# Patient Record
Sex: Female | Born: 1985 | Race: Black or African American | Hispanic: No | Smoking: Never smoker
Health system: Southern US, Community
[De-identification: ages and names within clinical notes are randomized; demographics above are authoritative.]

## PROBLEM LIST (undated history)

## (undated) DIAGNOSIS — R7303 Prediabetes: Secondary | ICD-10-CM

## (undated) DIAGNOSIS — E669 Obesity, unspecified: Secondary | ICD-10-CM

## (undated) HISTORY — DX: Prediabetes: R73.03

## (undated) HISTORY — PX: FRACTURE SURGERY: SHX138

## (undated) HISTORY — PX: WRIST SURGERY: SHX841

---
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
## 2007-01-18 ENCOUNTER — Emergency Department (HOSPITAL_COMMUNITY): Admission: EM | Admit: 2007-01-18 | Discharge: 2007-01-18 | Payer: Self-pay | Admitting: Family Medicine

## 2008-04-07 ENCOUNTER — Emergency Department (HOSPITAL_COMMUNITY): Admission: EM | Admit: 2008-04-07 | Discharge: 2008-04-07 | Payer: Self-pay | Admitting: Emergency Medicine

## 2009-08-09 ENCOUNTER — Emergency Department (HOSPITAL_COMMUNITY)
Admission: EM | Admit: 2009-08-09 | Discharge: 2009-08-09 | Payer: Self-pay | Source: Home / Self Care | Admitting: Family Medicine

## 2010-05-08 LAB — CULTURE, ROUTINE-ABSCESS

## 2010-06-07 LAB — POCT URINALYSIS DIP (DEVICE)
Bilirubin Urine: NEGATIVE
Glucose, UA: NEGATIVE mg/dL
Ketones, ur: NEGATIVE mg/dL
Protein, ur: NEGATIVE mg/dL
pH: 7 (ref 5.0–8.0)

## 2010-06-07 LAB — HEPATITIS B SURFACE ANTIGEN: Hepatitis B Surface Ag: NEGATIVE

## 2010-06-07 LAB — HEPATITIS C ANTIBODY: HCV Ab: NEGATIVE

## 2010-06-07 LAB — HEPATITIS B CORE ANTIBODY, TOTAL: Hep B Core Total Ab: NEGATIVE

## 2010-06-07 LAB — RPR: RPR Ser Ql: NONREACTIVE

## 2010-06-07 LAB — WET PREP, GENITAL: Yeast Wet Prep HPF POC: NONE SEEN

## 2010-06-07 LAB — HEPATITIS B SURFACE ANTIBODY,QUALITATIVE: Hep B S Ab: POSITIVE — AB

## 2010-11-29 LAB — POCT RAPID STREP A: Streptococcus, Group A Screen (Direct): POSITIVE — AB

## 2013-03-05 ENCOUNTER — Emergency Department (HOSPITAL_COMMUNITY)
Admission: EM | Admit: 2013-03-05 | Discharge: 2013-03-05 | Disposition: A | Discharge status: Home / Self Care | Payer: BC Managed Care – PPO | Attending: Emergency Medicine | Admitting: Emergency Medicine

## 2013-03-05 ENCOUNTER — Encounter (HOSPITAL_COMMUNITY): Payer: Self-pay | Admitting: Emergency Medicine

## 2013-03-05 ENCOUNTER — Emergency Department (HOSPITAL_COMMUNITY): Payer: BC Managed Care – PPO

## 2013-03-05 DIAGNOSIS — S60211A Contusion of right wrist, initial encounter: Secondary | ICD-10-CM

## 2013-03-05 DIAGNOSIS — S300XXA Contusion of lower back and pelvis, initial encounter: Secondary | ICD-10-CM | POA: Insufficient documentation

## 2013-03-05 DIAGNOSIS — Y939 Activity, unspecified: Secondary | ICD-10-CM | POA: Insufficient documentation

## 2013-03-05 DIAGNOSIS — S60221A Contusion of right hand, initial encounter: Secondary | ICD-10-CM

## 2013-03-05 DIAGNOSIS — Y9269 Other specified industrial and construction area as the place of occurrence of the external cause: Secondary | ICD-10-CM | POA: Insufficient documentation

## 2013-03-05 DIAGNOSIS — W009XXA Unspecified fall due to ice and snow, initial encounter: Secondary | ICD-10-CM

## 2013-03-05 DIAGNOSIS — S60229A Contusion of unspecified hand, initial encounter: Secondary | ICD-10-CM | POA: Insufficient documentation

## 2013-03-05 DIAGNOSIS — W1789XA Other fall from one level to another, initial encounter: Secondary | ICD-10-CM | POA: Insufficient documentation

## 2013-03-05 DIAGNOSIS — S60219A Contusion of unspecified wrist, initial encounter: Secondary | ICD-10-CM | POA: Insufficient documentation

## 2013-03-05 MED ORDER — IBUPROFEN 400 MG PO TABS
600.0000 mg | ORAL_TABLET | Freq: Once | ORAL | Status: AC
  Administered 2013-03-05: 600 mg via ORAL
  Filled 2013-03-05 (×2): qty 1

## 2013-03-05 MED ORDER — IBUPROFEN 600 MG PO TABS: 600.0000 mg | ORAL_TABLET | Freq: Four times a day (QID) | ORAL | Status: DC | PRN

## 2013-03-05 NOTE — ED Provider Notes (Signed)
CSN: [id number]     Arrival date & time 03/05/13  1339 History   First MD Initiated Contact with Patient 03/05/13 1530     Chief Complaint  Patient presents with  . 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 Pain is worse with movement and improves with holding still. No medications have been taken at home. Wrist pain is located in the right wrist and radiates towards the right hand and thumb. Pain is dull without modifying factors. Pain has been constant. Lower back pain is located in lower back. Is dull does not radiate is no modifying factors. No history of head injury. No history of bleeding diatheses  Patient is a 28 y.o. female presenting with fall. The history is provided by the patient and a relative.  Fall This is a new problem. The current episode started 3 to 5 hours ago. The problem occurs constantly. The problem has not changed since onset.Pertinent negatives include no chest pain, no abdominal pain, no headaches and no shortness of breath. The symptoms are aggravated by bending. Nothing relieves the symptoms. She has tried nothing for the symptoms. The treatment provided no relief.    History reviewed. No pertinent past medical history. History reviewed. No pertinent past surgical history. No family history on file. History  Substance Use Topics  . Smoking status: Never Smoker   . Smokeless tobacco: Not on file  . Alcohol Use: No   OB History   Grav Para Term Preterm Abortions TAB SAB Ect Mult Living                 Review of Systems  Respiratory: Negative for shortness of breath.   Cardiovascular: Negative for chest pain.  Gastrointestinal: Negative for abdominal pain.  Neurological: Negative for headaches.  All other systems reviewed and are  negative.    Allergies  Review of patient's allergies indicates no known allergies.  Home Medications   Current Outpatient Rx  Name  Route  Sig  Dispense  Refill  . acetaminophen (TYLENOL) 500 MG tablet   Oral   Take 1,000 mg by mouth every 6 (six) hours as needed.          BP 144/81  Pulse 77  Temp(Src) 98 F (36.7 C) (Oral)  Resp 16  SpO2 98% Physical Exam  Nursing note and vitals reviewed. Constitutional: She is oriented to person, place, and time. She appears well-developed and well-nourished.  HENT:  Head: Normocephalic.  Right Ear: External ear normal.  Left Ear: External ear normal.  Nose: Nose normal.  Mouth/Throat: Oropharynx is clear and moist.  Eyes: EOM are normal. Pupils are equal, round, and reactive to light. Right eye exhibits no discharge. Left eye exhibits no discharge.  Neck: Normal range of motion. Neck supple. No tracheal deviation present.  No nuchal rigidity no meningeal signs  Cardiovascular: Normal rate and regular rhythm.   Pulmonary/Chest: Effort normal and breath sounds normal. No stridor. No respiratory distress. She has no wheezes. She has no rales.  Abdominal: Soft. She exhibits no distension and no mass. There is no tenderness. There is no rebound and no guarding.  Musculoskeletal: Normal range of motion. She exhibits tenderness. She exhibits no edema.  Tenderness along distal radius and ulna extending towards the cut or evidence. No snuff box tenderness. Full range of motion of fingers  wrist elbow shoulder. No clavicle shoulder humerus elbow proximal radius and ulna tenderness noted on exam. No lower extremity tenderness noted. No cervical thoracic or lumbar tenderness noted midline. Left and right paraspinal sacral tenderness noted on exam.  Neurological: She is alert and oriented to person, place, and time. She has normal reflexes. She displays normal reflexes. No cranial nerve deficit. She exhibits normal muscle tone. Coordination normal.   Skin: Skin is warm. No rash noted. She is not diaphoretic. No erythema. No pallor.  No pettechia no purpura    ED Course  Procedures (including critical care time) Labs Review Labs Reviewed - No data to display Imaging Review Dg Lumbar Spine 2-3 Views  03/05/2013   CLINICAL DATA:  Larey Seat.  Injured back.  EXAM: LUMBAR SPINE - 2-3 VIEW  COMPARISON:  None.  FINDINGS: Normal alignment of the lumbar vertebral bodies. Disc spaces and vertebral bodies are maintained. The facets are normally aligned. No pars defects. The visualized bony pelvis is intact.  IMPRESSION: Normal alignment and no acute bony findings.   Electronically Signed   By: Loralie Champagne M.D.   On: 03/05/2013 16:32   Dg Wrist Complete Right  03/05/2013   CLINICAL DATA:  Pain.  EXAM: RIGHT WRIST - COMPLETE 3+ VIEW  COMPARISON:  None.  FINDINGS: There is no evidence of fracture or dislocation. There is no evidence of arthropathy or other focal bone abnormality. Soft tissues are unremarkable.  IMPRESSION: Negative.   Electronically Signed   By: Maisie Fus  Register   On: 03/05/2013 14:58   Dg Hand Complete Right  03/05/2013   CLINICAL DATA:  Larey Seat.  Injured right hand.  EXAM: RIGHT HAND - COMPLETE 3+ VIEW  COMPARISON:  Wrist radiographs, same date.  FINDINGS: The joint spaces are maintained.  No acute fracture is identified.  IMPRESSION: No acute fracture.   Electronically Signed   By: Loralie Champagne M.D.   On: 03/05/2013 16:34    EKG Interpretation   None       MDM   1. Fall due to ice or snow   2. Contusion of right wrist   3. Contusion of right hand   4. Sacral contusion      Will obtain screening x-rays of the right wrist right hand and lumbar sacral region to ensure no fracture dislocation or subluxation. We'll give ibuprofen for pain. No other head neck chest abdomen pelvis or extremity complaints at this time. Patient updated and agrees with plan. Patient is an intact neurologic exam.  Pt has had no bowel or bladder  dysfuction    448p x-rays reviewed by myself and show no evidence of acute pathology. Patient remains neurovascularly intact distally and has an intact neurologic exam including reflexes sensation and motor strength of lower extremities. We'll discharge home with ibuprofen as needed for pain. Family and patient updated and agrees with plan.  Arley Phenix, MD 03/05/13 [phone]

## 2013-03-05 NOTE — ED Notes (Signed)
Pt reports slipped on ice this am, pt c/o lower back pain and right wrist/hand pain.

## 2013-03-05 NOTE — Discharge Instructions (Signed)
Hand Contusion A hand contusion is a deep bruise on your hand area. Contusions are the result of an injury that caused bleeding under the skin. The contusion may turn blue, purple, or yellow. Minor injuries will give you a painless contusion, but more severe contusions may stay painful and swollen for a few weeks. CAUSES  A contusion is usually caused by a blow, trauma, or direct force to an area of the body. SYMPTOMS   Swelling and redness of the injured area.  Discoloration of the injured area.  Tenderness and soreness of the injured area.  Pain. DIAGNOSIS  The diagnosis can be made by taking a history and performing a physical exam. An X-ray, CT scan, or MRI may be needed to determine if there were any associated injuries, such as broken bones (fractures). TREATMENT  Often, the best treatment for a hand contusion is resting, elevating, icing, and applying cold compresses to the injured area. Over-the-counter medicines may also be recommended for pain control. HOME CARE INSTRUCTIONS   Put ice on the injured area.  Put ice in a plastic bag.  Place a towel between your skin and the bag.  Leave the ice on for 15-20 minutes, 03-04 times a day.  Only take over-the-counter or prescription medicines as directed by your caregiver. Your caregiver may recommend avoiding anti-inflammatory medicines (aspirin, ibuprofen, and naproxen) for 48 hours because these medicines may increase bruising.  If told, use an elastic wrap as directed. This can help reduce swelling. You may remove the wrap for sleeping, showering, and bathing. If your fingers become numb, cold, or blue, take the wrap off and reapply it more loosely.  Elevate your hand with pillows to reduce swelling.  Avoid overusing your hand if it is painful. SEEK IMMEDIATE MEDICAL CARE IF:   You have increased redness, swelling, or pain in your hand.  Your swelling or pain is not relieved with medicines.  You have loss of feeling in  your hand or are unable to move your fingers.  Your hand turns cold or blue.  You have pain when you move your fingers.  Your hand becomes warm to the touch.  Your contusion does not improve in 2 days. MAKE SURE YOU:   Understand these instructions.  Will watch your condition.  Will get help right away if you are not doing well or get worse. Document Released: 07/29/2001 Document Revised: 11/01/2011 Document Reviewed: 07/31/2011 [facility] Patient Information 2014 [place], [place].  Tailbone Injury The tailbone (coccyx) is the small bone at the lower end of the spine. A tailbone injury may involve stretched ligaments, bruising, or a broken bone (fracture). Women are more vulnerable to this injury due to having a wider pelvis. CAUSES  This type of injury typically occurs from falling and landing on the tailbone. Repeated strain or friction from actions such as rowing and bicycling may also injure the area. The tailbone can be injured during childbirth. Infections or tumors may also press on the tailbone and cause pain. Sometimes, the cause of injury is unknown. SYMPTOMS   Bruising.  Pain when sitting.  Painful bowel movements.  In women, pain during intercourse. DIAGNOSIS  Your caregiver can diagnose a tailbone injury based on your symptoms and a physical exam. X-rays may be taken if a fracture is suspected. Your caregiver may also use an MRI scan imaging test to evaluate your symptoms. TREATMENT  Your caregiver may prescribe medicines to help relieve your pain. Most tailbone injuries heal on their own in 4  to 6 weeks. However, if the injury is caused by an infection or tumor, the recovery period may vary. PREVENTION  Wear appropriate padding and sports gear when bicycling and rowing. This can help prevent an injury from repeated strain or friction. HOME CARE INSTRUCTIONS   Put ice on the injured area.  Put ice in a plastic bag.  Place a towel between your skin and the  bag.  Leave the ice on for 15-20 minutes, every hour while awake for the first 1 to 2 days.  Sit on a large, rubber or inflated ring or cushion to ease your pain. Lean forward when sitting to help decrease discomfort.  Avoid sitting for long periods of time.  Increase your activity as the pain allows.  Only take over-the-counter or prescription medicines for pain, discomfort, or fever as directed by your caregiver.  You may use stool softeners if it is painful to have a bowel movement, or as directed by your caregiver.  Eat a diet with plenty of fiber to help prevent constipation.  Keep all follow-up appointments as directed by your caregiver. SEEK MEDICAL CARE IF:   Your pain becomes worse.  Your bowel movements cause a great deal of discomfort.  You are unable to have a bowel movement.  You have a fever. MAKE SURE YOU:  Understand these instructions.  Will watch your condition.  Will get help right away if you are not doing well or get worse. Document Released: 02/04/2000 Document Revised: 05/01/2011 Document Reviewed: 09/01/2010 [facility] Patient Information 2014 [place], [place].

## 2014-05-07 ENCOUNTER — Emergency Department (HOSPITAL_COMMUNITY)
Admission: EM | Admit: 2014-05-07 | Discharge: 2014-05-07 | Disposition: A | Discharge status: Home / Self Care | Payer: BLUE CROSS/BLUE SHIELD | Attending: Emergency Medicine | Admitting: Emergency Medicine

## 2014-05-07 ENCOUNTER — Encounter (HOSPITAL_COMMUNITY): Payer: Self-pay

## 2014-05-07 DIAGNOSIS — N764 Abscess of vulva: Secondary | ICD-10-CM | POA: Insufficient documentation

## 2014-05-07 MED ORDER — HYDROCODONE-ACETAMINOPHEN 5-325 MG PO TABS
2.0000 | ORAL_TABLET | Freq: Once | ORAL | Status: AC
  Administered 2014-05-07: 2 via ORAL
  Filled 2014-05-07: qty 2

## 2014-05-07 MED ORDER — LIDOCAINE-EPINEPHRINE (PF) 2 %-1:200000 IJ SOLN
10.0000 mL | Freq: Once | INTRAMUSCULAR | Status: DC
  Filled 2014-05-07: qty 20

## 2014-05-07 MED ORDER — HYDROCODONE-ACETAMINOPHEN 5-325 MG PO TABS: 1.0000 | ORAL_TABLET | Freq: Four times a day (QID) | ORAL | Status: DC | PRN

## 2014-05-07 MED ORDER — LIDOCAINE-PRILOCAINE 2.5-2.5 % EX CREA
TOPICAL_CREAM | Freq: Once | CUTANEOUS | Status: AC
  Administered 2014-05-07: 07:00:00 via TOPICAL
  Filled 2014-05-07: qty 5

## 2014-05-07 NOTE — ED Notes (Signed)
Pt reports boil in left groin area. Pt states pain is 10/10. Pt has had boils in past that usually go away on their own.

## 2014-05-07 NOTE — ED Provider Notes (Signed)
CSN: [id number]     Arrival date & time 05/07/14  0547 History   First MD Initiated Contact with Patient 05/07/14 [phone]     Chief Complaint  Patient presents with  . Abscess     (Consider location/radiation/quality/duration/timing/severity/associated sxs/prior Treatment) HPI Comments: Patient is a 29 yo F presenting to the ED for evaluation of an abscess to her left groin area that began two days ago. She states her pain has been worsening since then. Currently her pain is a 10/10. Denies any radiation of her pain. No modifying factors identified. Does endorse a history of abscesses in the past. Denies any fevers, chills, drainage.    History reviewed. No pertinent past medical history. History reviewed. No pertinent past surgical history. History reviewed. No pertinent family history. History  Substance Use Topics  . Smoking status: Never Smoker   . Smokeless tobacco: Not on file  . Alcohol Use: No   OB History    No data available     Review of Systems  Skin: Positive for wound.  All other systems reviewed and are negative.     Allergies  Review of patient's allergies indicates no known allergies.  Home Medications   Prior to Admission medications   Medication Sig Start Date End Date Taking? Authorizing Provider  HYDROcodone-acetaminophen (NORCO/VICODIN) 5-325 MG per tablet Take 1 tablet by mouth every 6 (six) hours as needed for moderate pain.   Yes Historical Provider, MD  ibuprofen (ADVIL,MOTRIN) 800 MG tablet Take 800 mg by mouth every 8 (eight) hours as needed for mild pain.   Yes Historical Provider, MD  ibuprofen (ADVIL,MOTRIN) 600 MG tablet Take 1 tablet (600 mg total) by mouth every 6 (six) hours as needed for fever or mild pain. Patient not taking: Reported on 05/07/2014 03/05/13   Marcellina Millinimothy Galey, MD   BP 149/80 mmHg  Pulse 82  Temp(Src) 99.8 F (37.7 C) (Oral)  Resp 16  Ht 5\' 7"  (1.702 m)  Wt 350 lb (158.759 kg)  BMI 54.80 kg/m2  SpO2 98% Physical Exam    Constitutional: She is oriented to person, place, and time. She appears well-developed and well-nourished. No distress.  HENT:  Head: Normocephalic and atraumatic.  Right Ear: External ear normal.  Left Ear: External ear normal.  Nose: Nose normal.  Mouth/Throat: Oropharynx is clear and moist.  Eyes: Conjunctivae are normal.  Neck: Normal range of motion. Neck supple.  Cardiovascular: Normal rate, regular rhythm and normal heart sounds.   Pulmonary/Chest: Effort normal and breath sounds normal. No respiratory distress.  Abdominal: Soft.  Genitourinary:     Musculoskeletal: Normal range of motion.  Neurological: She is alert and oriented to person, place, and time.  Skin: Skin is warm and dry. She is not diaphoretic.  Psychiatric: She has a normal mood and affect.  Nursing note and vitals reviewed.    ED Course  Procedures (including critical care time) Medications  lidocaine-EPINEPHrine (XYLOCAINE W/EPI) 2 %-1:200000 (PF) injection 10 mL (not administered)  lidocaine-prilocaine (EMLA) cream ( Topical Given 05/07/14 0631)  HYDROcodone-acetaminophen (NORCO/VICODIN) 5-325 MG per tablet 2 tablet (2 tablets Oral Given 05/07/14 0619)    Labs Review Labs Reviewed - No data to display  Imaging Review No results found.   EKG Interpretation None      INCISION AND DRAINAGE Performed by: Francee PiccoloPIEPENBRINK, Saina Waage L Consent: Verbal consent obtained. Risks and benefits: risks, benefits and alternatives were discussed Type: abscess  Body area: left labia  Anesthesia: local infiltration  Incision was made with  a scalpel.  Local anesthetic:LET  Anesthetic total: 3 ml  Complexity: complex Blunt dissection to break up loculations  Drainage: purulent  Drainage amount: copious  Packing material: 1/4 in iodoform gauze  Patient tolerance: Patient tolerated the procedure well with no immediate complications. MDM   Final diagnoses:  None    Filed Vitals:   05/07/14 0630   BP: 149/80  Pulse: 82  Temp:   Resp: 16   Afebrile, NAD, non-toxic appearing, AAOx4.  Patient with skin abscess amenable to incision and drainage.  Abscess was not large enough to warrant packing or drain,  wound recheck in 2 days. Encouraged home warm soaks and flushing.  Mild signs of cellulitis is surrounding skin.  Will d/c to home.  No antibiotic therapy is indicated. Return precautions discussed. Patient is agreeable to plan. Patient is stable at time of discharge      Francee Piccolo, PA-C 05/07/14 1459  Azalia Bilis, MD 05/08/14 [phone]

## 2014-05-07 NOTE — Discharge Instructions (Signed)
Please follow up with your primary care physician in 1-2 days. If you do not have one please call the [facility] number listed above. Please read all discharge instructions and return precautions.  ° ° °Abscess °Care After °An abscess (also called a boil or furuncle) is an infected area that contains a collection of pus. Signs and symptoms of an abscess include pain, tenderness, redness, or hardness, or you may feel a moveable soft area under your skin. An abscess can occur anywhere in the body. The infection may spread to surrounding tissues causing cellulitis. A cut (incision) by the surgeon was made over your abscess and the pus was drained out. Gauze may have been packed into the space to provide a drain that will allow the cavity to heal from the inside outwards. The boil may be painful for 5 to 7 days. Most people with a boil do not have high fevers. Your abscess, if seen early, may not have localized, and may not have been lanced. If not, another appointment may be required for this if it does not get better on its own or with medications. °HOME CARE INSTRUCTIONS  °· Only take over-the-counter or prescription medicines for pain, discomfort, or fever as directed by your caregiver. °· When you bathe, soak and then remove gauze or iodoform packs at least daily or as directed by your caregiver. You may then wash the wound gently with mild soapy water. Repack with gauze or do as your caregiver directs. °SEEK IMMEDIATE MEDICAL CARE IF:  °· You develop increased pain, swelling, redness, drainage, or bleeding in the wound site. °· You develop signs of generalized infection including muscle aches, chills, fever, or a general ill feeling. °· An oral temperature above 102° F (38.9° C) develops, not controlled by medication. °See your caregiver for a recheck if you develop any of the symptoms described above. If medications (antibiotics) were prescribed, take them as directed. °Document Released:  08/25/2004 Document Revised: 05/01/2011 Document Reviewed: 04/22/2007 °ExitCare® Patient Information ©2015 [employer]. This information is not intended to replace advice given to you by your health care provider. Make sure you discuss any questions you have with your health care provider. ° °

## 2014-05-29 ENCOUNTER — Encounter (HOSPITAL_BASED_OUTPATIENT_CLINIC_OR_DEPARTMENT_OTHER): Payer: Self-pay | Admitting: *Deleted

## 2014-05-29 ENCOUNTER — Emergency Department (HOSPITAL_BASED_OUTPATIENT_CLINIC_OR_DEPARTMENT_OTHER)
Admission: EM | Admit: 2014-05-29 | Discharge: 2014-05-29 | Disposition: A | Discharge status: Home / Self Care | Payer: BLUE CROSS/BLUE SHIELD | Attending: Emergency Medicine | Admitting: Emergency Medicine

## 2014-05-29 DIAGNOSIS — S0083XA Contusion of other part of head, initial encounter: Secondary | ICD-10-CM | POA: Insufficient documentation

## 2014-05-29 DIAGNOSIS — W2203XA Walked into furniture, initial encounter: Secondary | ICD-10-CM | POA: Diagnosis not present

## 2014-05-29 DIAGNOSIS — S0990XA Unspecified injury of head, initial encounter: Secondary | ICD-10-CM | POA: Diagnosis present

## 2014-05-29 DIAGNOSIS — Y9289 Other specified places as the place of occurrence of the external cause: Secondary | ICD-10-CM | POA: Insufficient documentation

## 2014-05-29 DIAGNOSIS — Y9389 Activity, other specified: Secondary | ICD-10-CM | POA: Insufficient documentation

## 2014-05-29 DIAGNOSIS — S0093XA Contusion of unspecified part of head, initial encounter: Secondary | ICD-10-CM

## 2014-05-29 DIAGNOSIS — Y998 Other external cause status: Secondary | ICD-10-CM | POA: Insufficient documentation

## 2014-05-29 MED ORDER — IBUPROFEN 800 MG PO TABS
800.0000 mg | ORAL_TABLET | Freq: Once | ORAL | Status: AC
  Administered 2014-05-29: 800 mg via ORAL
  Filled 2014-05-29: qty 1

## 2014-05-29 NOTE — ED Provider Notes (Signed)
CSN: [id number]     Arrival date & time 05/29/14  1838 History   First MD Initiated Contact with Patient 05/29/14 1851     Chief Complaint  Patient presents with  . Head Injury     (Consider location/radiation/quality/duration/timing/severity/associated sxs/prior Treatment) HPI Comments: 29 year old female complaining of frontal headache after hitting her head on a cabinet about 3 hours PTA. Reports she bent over and accidentally hit her head on a cabinet at work. No loss of consciousness. States she felt dizzy and was seeing stars after hitting her head. No aggravating or alleviating factors. No medications tried. Admits to slight nausea without vomiting. Denies vision changes, vomiting, confusion, unsteadiness.  Patient is a 29 y.o. female presenting with head injury. The history is provided by the patient.  Head Injury Associated symptoms: headache     History reviewed. No pertinent past medical history. Past Surgical History  Procedure Laterality Date  . Wrist surgery     No family history on file. History  Substance Use Topics  . Smoking status: Never Smoker   . Smokeless tobacco: Not on file  . Alcohol Use: No   OB History    No data available     Review of Systems  Neurological: Positive for headaches.  All other systems reviewed and are negative.     Allergies  Review of patient's allergies indicates no known allergies.  Home Medications   Prior to Admission medications   Medication Sig Start Date End Date Taking? Authorizing Provider  HYDROcodone-acetaminophen (NORCO/VICODIN) 5-325 MG per tablet Take 1 tablet by mouth every 6 (six) hours as needed for moderate pain.    Historical Provider, MD  HYDROcodone-acetaminophen (NORCO/VICODIN) 5-325 MG per tablet Take 1-2 tablets by mouth every 6 (six) hours as needed for severe pain. 05/07/14   Jennifer Piepenbrink, PA-C  ibuprofen (ADVIL,MOTRIN) 600 MG tablet Take 1 tablet (600 mg total) by mouth every 6 (six) hours  as needed for fever or mild pain. Patient not taking: Reported on 05/07/2014 03/05/13   Marcellina Millinimothy Galey, MD  ibuprofen (ADVIL,MOTRIN) 800 MG tablet Take 800 mg by mouth every 8 (eight) hours as needed for mild pain.    Historical Provider, MD   BP 144/72 mmHg  Pulse 72  Temp(Src) 98.4 F (36.9 C) (Oral)  Resp 20  Ht 5\' 7"  (1.702 m)  Wt 327 lb (148.326 kg)  BMI 51.20 kg/m2  SpO2 100% Physical Exam  Constitutional: She is oriented to person, place, and time. She appears well-developed and well-nourished. No distress.  HENT:  Head: Normocephalic.  Right Ear: No hemotympanum.  Left Ear: No hemotympanum.  Mouth/Throat: Oropharynx is clear and moist.  Mild tenderness to forehead without edema, laceration, color change.  Eyes: Conjunctivae and EOM are normal. Pupils are equal, round, and reactive to light.  Neck: Normal range of motion. Neck supple.  Cardiovascular: Normal rate, regular rhythm, normal heart sounds and intact distal pulses.   Pulmonary/Chest: Effort normal and breath sounds normal. No respiratory distress.  Abdominal: Soft. Bowel sounds are normal. There is no tenderness.  Musculoskeletal: Normal range of motion. She exhibits no edema.  Neurological: She is alert and oriented to person, place, and time. She has normal strength. No cranial nerve deficit or sensory deficit. Coordination and gait normal. GCS eye subscore is 4. GCS verbal subscore is 5. GCS motor subscore is 6.  Speech fluent, goal oriented. Moves limbs but ataxia. Equal grip strength bilateral.  Skin: Skin is warm and dry. No rash noted. She is  not diaphoretic.  Psychiatric: She has a normal mood and affect. Her behavior is normal.  Nursing note and vitals reviewed.   ED Course  Procedures (including critical care time) Labs Review Labs Reviewed - No data to display  Imaging Review No results found.   EKG Interpretation None      MDM   Final diagnoses:  Head contusion, initial encounter    Nontoxic appearing, NAD. No focal neurologic deficits. Vital signs stable. Ambulates without difficulty. Doubt intracranial bleed. I do not feel imaging is appropriate at this time. Advised rest, apply ice to the front of her head, NSAIDs. Follow-up with PCP in one week for reevaluation. Stable for discharge. Return precautions given. Patient states understanding of treatment care plan and is agreeable.  Kathrynn Speed, PA-C 05/29/14 1914  Purvis Sheffield, MD 05/30/14 [phone]

## 2014-05-29 NOTE — ED Notes (Signed)
Pt sts she hit her head on a cabinet. Pt denies LOC but is c/o dizziness.

## 2014-05-29 NOTE — Discharge Instructions (Signed)
You may take ibuprofen or Tylenol for the pain. Rest, apply ice to your head. No contact sports or hard physical activity for 1 week until cleared by her primary care physician.  Head Injury You have received a head injury. It does not appear serious at this time. Headaches and vomiting are common following head injury. It should be easy to awaken from sleeping. Sometimes it is necessary for you to stay in the emergency department for a while for observation. Sometimes admission to the hospital may be needed. After injuries such as yours, most problems occur within the first 24 hours, but side effects may occur up to 7-10 days after the injury. It is important for you to carefully monitor your condition and contact your health care provider or seek immediate medical care if there is a change in your condition. WHAT ARE THE TYPES OF HEAD INJURIES? Head injuries can be as minor as a bump. Some head injuries can be more severe. More severe head injuries include:  A jarring injury to the brain (concussion).  A bruise of the brain (contusion). This mean there is bleeding in the brain that can cause swelling.  A cracked skull (skull fracture).  Bleeding in the brain that collects, clots, and forms a bump (hematoma). WHAT CAUSES A HEAD INJURY? A serious head injury is most likely to happen to someone who is in a car wreck and is not wearing a seat belt. Other causes of major head injuries include bicycle or motorcycle accidents, sports injuries, and falls. HOW ARE HEAD INJURIES DIAGNOSED? A complete history of the event leading to the injury and your current symptoms will be helpful in diagnosing head injuries. Many times, pictures of the brain, such as CT or MRI are needed to see the extent of the injury. Often, an overnight hospital stay is necessary for observation.  WHEN SHOULD I SEEK IMMEDIATE MEDICAL CARE?  You should get help right away if:  You have confusion or drowsiness.  You feel sick to  your stomach (nauseous) or have continued, forceful vomiting.  You have dizziness or unsteadiness that is getting worse.  You have severe, continued headaches not relieved by medicine. Only take over-the-counter or prescription medicines for pain, fever, or discomfort as directed by your health care provider.  You do not have normal function of the arms or legs or are unable to walk.  You notice changes in the black spots in the center of the colored part of your eye (pupil).  You have a clear or bloody fluid coming from your nose or ears.  You have a loss of vision. During the next 24 hours after the injury, you must stay with someone who can watch you for the warning signs. This person should contact local emergency services (911 in the [place] if you have seizures, you become unconscious, or you are unable to wake up. HOW CAN I PREVENT A HEAD INJURY IN THE FUTURE? The most important factor for preventing major head injuries is avoiding motor vehicle accidents. To minimize the potential for damage to your head, it is crucial to wear seat belts while riding in motor vehicles. Wearing helmets while bike riding and playing collision sports (like football) is also helpful. Also, avoiding dangerous activities around the house will further help reduce your risk of head injury.  WHEN CAN I RETURN TO NORMAL ACTIVITIES AND ATHLETICS? You should be reevaluated by your health care provider before returning to these activities. If you have any of the following  symptoms, you should not return to activities or contact sports until 1 week after the symptoms have stopped:  Persistent headache.  Dizziness or vertigo.  Poor attention and concentration.  Confusion.  Memory problems.  Nausea or vomiting.  Fatigue or tire easily.  Irritability.  Intolerant of bright lights or loud noises.  Anxiety or depression.  Disturbed sleep. MAKE SURE YOU:   Understand these instructions.  Will watch  your condition.  Will get help right away if you are not doing well or get worse. Document Released: 02/06/2005 Document Revised: 02/11/2013 Document Reviewed: 10/14/2012 [facility] Patient Information 2015 [place], [place]. This information is not intended to replace advice given to you by your health care provider. Make sure you discuss any questions you have with your health care provider.

## 2014-09-25 ENCOUNTER — Emergency Department (HOSPITAL_BASED_OUTPATIENT_CLINIC_OR_DEPARTMENT_OTHER)
Admission: EM | Admit: 2014-09-25 | Discharge: 2014-09-25 | Disposition: A | Discharge status: Home / Self Care | Payer: BLUE CROSS/BLUE SHIELD | Attending: Emergency Medicine | Admitting: Emergency Medicine

## 2014-09-25 ENCOUNTER — Encounter (HOSPITAL_BASED_OUTPATIENT_CLINIC_OR_DEPARTMENT_OTHER): Payer: Self-pay | Admitting: *Deleted

## 2014-09-25 ENCOUNTER — Emergency Department (HOSPITAL_BASED_OUTPATIENT_CLINIC_OR_DEPARTMENT_OTHER): Payer: BLUE CROSS/BLUE SHIELD

## 2014-09-25 DIAGNOSIS — W010XXA Fall on same level from slipping, tripping and stumbling without subsequent striking against object, initial encounter: Secondary | ICD-10-CM | POA: Diagnosis not present

## 2014-09-25 DIAGNOSIS — Y93E5 Activity, floor mopping and cleaning: Secondary | ICD-10-CM | POA: Diagnosis not present

## 2014-09-25 DIAGNOSIS — Z3202 Encounter for pregnancy test, result negative: Secondary | ICD-10-CM | POA: Diagnosis not present

## 2014-09-25 DIAGNOSIS — Y9289 Other specified places as the place of occurrence of the external cause: Secondary | ICD-10-CM | POA: Insufficient documentation

## 2014-09-25 DIAGNOSIS — Y998 Other external cause status: Secondary | ICD-10-CM | POA: Diagnosis not present

## 2014-09-25 DIAGNOSIS — S3992XA Unspecified injury of lower back, initial encounter: Secondary | ICD-10-CM | POA: Diagnosis not present

## 2014-09-25 DIAGNOSIS — M5432 Sciatica, left side: Secondary | ICD-10-CM

## 2014-09-25 LAB — URINALYSIS, ROUTINE W REFLEX MICROSCOPIC
BILIRUBIN URINE: NEGATIVE
GLUCOSE, UA: NEGATIVE mg/dL
KETONES UR: NEGATIVE mg/dL
Leukocytes, UA: NEGATIVE
NITRITE: NEGATIVE
Protein, ur: 30 mg/dL — AB
SPECIFIC GRAVITY, URINE: 1.022 (ref 1.005–1.030)
Urobilinogen, UA: 1 mg/dL (ref 0.0–1.0)
pH: 8 (ref 5.0–8.0)

## 2014-09-25 LAB — URINE MICROSCOPIC-ADD ON

## 2014-09-25 LAB — PREGNANCY, URINE: Preg Test, Ur: NEGATIVE

## 2014-09-25 MED ORDER — KETOROLAC TROMETHAMINE 60 MG/2ML IM SOLN
60.0000 mg | Freq: Once | INTRAMUSCULAR | Status: AC
  Administered 2014-09-25: 60 mg via INTRAMUSCULAR
  Filled 2014-09-25: qty 2

## 2014-09-25 MED ORDER — PREDNISONE 20 MG PO TABS: ORAL_TABLET | ORAL | Status: DC

## 2014-09-25 MED ORDER — HYDROCODONE-ACETAMINOPHEN 5-325 MG PO TABS: 1.0000 | ORAL_TABLET | ORAL | Status: DC | PRN

## 2014-09-25 NOTE — ED Provider Notes (Signed)
CSN: [id number]     Arrival date & time 09/25/14  1256 History   First MD Initiated Contact with Patient 09/25/14 1321     Chief Complaint  Patient presents with  . Fall     (Consider location/radiation/quality/duration/timing/severity/associated sxs/prior Treatment) HPI Comments: Patient presents with back pain. She states that about one week ago she had a fall. She was mopping a floor and slipped falling backward onto her buttocks. She's had some worsening pain to her left buttocks and low back area. She denies any radiation down her legs. She denies any numbness or weakness in her leg. She was previously having pain in her ankle and knee but says that is essentially gone away. She denies any other injuries from the fall. She denies any prior back problems. She states the pain is worse when she's sitting down on her bottom. She's been using over-the-counter medicines without relief.  Patient is a 29 y.o. female presenting with fall.  Fall Pertinent negatives include no headaches.    History reviewed. No pertinent past medical history. Past Surgical History  Procedure Laterality Date  . Wrist surgery     History reviewed. No pertinent family history. History  Substance Use Topics  . Smoking status: Never Smoker   . Smokeless tobacco: Not on file  . Alcohol Use: No   OB History    No data available     Review of Systems  Constitutional: Negative for fever.  Gastrointestinal: Negative for nausea and vomiting.  Musculoskeletal: Positive for back pain. Negative for joint swelling, arthralgias and neck pain.  Skin: Negative for wound.  Neurological: Negative for weakness, numbness and headaches.      Allergies  Review of patient's allergies indicates no known allergies.  Home Medications   Prior to Admission medications   Medication Sig Start Date End Date Taking? Authorizing Provider  HYDROcodone-acetaminophen (NORCO/VICODIN) 5-325 MG per tablet Take 1-2 tablets by mouth  every 4 (four) hours as needed. 09/25/14   Rolan Bucco, MD  predniSONE (DELTASONE) 20 MG tablet 3 tabs po day one, then 2 po daily x 4 days 09/25/14   Rolan Bucco, MD   BP 125/66 mmHg  Pulse 91  Temp(Src) 98.2 F (36.8 C) (Oral)  Resp 18  Ht  (1.702 m)  Wt 330 lb (149.687 kg)  BMI 51.67 kg/m2  SpO2 99% Physical Exam  Constitutional: She is oriented to person, place, and time. She appears well-developed and well-nourished.  HENT:  Head: Normocephalic and atraumatic.  Neck: Normal range of motion. Neck supple.  Cardiovascular: Normal rate.   Pulmonary/Chest: Effort normal.  Musculoskeletal: She exhibits no edema or tenderness.  Positive tenderness to the left lumbar paraspinal area in the left sciatic nerve area. She has normal sensation to the lower extremities. She has normal motor function in the lower extremities. Pedal pulses are intact. She has no pain on palpation or range of motion of the other extremities.  Neurological: She is alert and oriented to person, place, and time.  Skin: Skin is warm and dry.  Psychiatric: She has a normal mood and affect.    ED Course  Procedures (including critical care time) Labs Review Labs Reviewed  URINALYSIS, ROUTINE W REFLEX MICROSCOPIC (NOT AT [facility]) - Abnormal; Notable for the following:    Hgb urine dipstick LARGE (*)    Protein, ur 30 (*)    All other components within normal limits  PREGNANCY, URINE  URINE MICROSCOPIC-ADD ON    Imaging Review Dg Lumbar Spine  Complete  09/25/2014   CLINICAL DATA:  Pain following fall one week prior. Radicular symptoms into lower extremities  EXAM: LUMBAR SPINE - COMPLETE 4+ VIEW  COMPARISON:  March 05, 2013  FINDINGS: Frontal, lateral, spot lumbosacral lateral, and bilateral oblique views were obtained. There are 5 non-rib-bearing lumbar type vertebral bodies. There is no fracture or spondylolisthesis. Disc spaces appear unremarkable. There is no appreciable facet arthropathy.  IMPRESSION: No  fracture or spondylolisthesis.  No appreciable arthropathy.   Electronically Signed   By: Bretta Bang III M.D.   On: 09/25/2014 15:37     EKG Interpretation None      MDM   Final diagnoses:  Sciatica, left    Patient has no bony injuries. She has no neurologic deficits. She does have pain over her sciatic nerve. She has some blood in her urine but she says that she's having some vaginal bleeding right now. She was given dose of Toradol in the ED. She is discharged home with a prednisone prescription as well as Vicodin. She was encouraged to follow-up with her PCP if her symptoms are not improving.    Rolan Bucco, MD 09/25/14 [phone]

## 2014-09-25 NOTE — ED Notes (Signed)
Pt amb to triage with quick steady gait in nad. Pt reports fall one week ago, pain to her low back has increased and now goes down her left leg.

## 2014-09-25 NOTE — Discharge Instructions (Signed)
Sciatica Sciatica is pain, weakness, numbness, or tingling along the path of the sciatic nerve. The nerve starts in the lower back and runs down the back of each leg. The nerve controls the muscles in the lower leg and in the back of the knee, while also providing sensation to the back of the thigh, lower leg, and the sole of your foot. Sciatica is a symptom of another medical condition. For instance, nerve damage or certain conditions, such as a herniated disk or bone spur on the spine, pinch or put pressure on the sciatic nerve. This causes the pain, weakness, or other sensations normally associated with sciatica. Generally, sciatica only affects one side of the body. CAUSES   Herniated or slipped disc.  Degenerative disk disease.  A pain disorder involving the narrow muscle in the buttocks (piriformis syndrome).  Pelvic injury or fracture.  Pregnancy.  Tumor (rare). SYMPTOMS  Symptoms can vary from mild to very severe. The symptoms usually travel from the low back to the buttocks and down the back of the leg. Symptoms can include:  Mild tingling or dull aches in the lower back, leg, or hip.  Numbness in the back of the calf or sole of the foot.  Burning sensations in the lower back, leg, or hip.  Sharp pains in the lower back, leg, or hip.  Leg weakness.  Severe back pain inhibiting movement. These symptoms may get worse with coughing, sneezing, laughing, or prolonged sitting or standing. Also, being overweight may worsen symptoms. DIAGNOSIS  Your caregiver will perform a physical exam to look for common symptoms of sciatica. He or she may ask you to do certain movements or activities that would trigger sciatic nerve pain. Other tests may be performed to find the cause of the sciatica. These may include:  Blood tests.  X-rays.  Imaging tests, such as an MRI or CT scan. TREATMENT  Treatment is directed at the cause of the sciatic pain. Sometimes, treatment is not necessary  and the pain and discomfort goes away on its own. If treatment is needed, your caregiver may suggest:  Over-the-counter medicines to relieve pain.  Prescription medicines, such as anti-inflammatory medicine, muscle relaxants, or narcotics.  Applying heat or ice to the painful area.  Steroid injections to lessen pain, irritation, and inflammation around the nerve.  Reducing activity during periods of pain.  Exercising and stretching to strengthen your abdomen and improve flexibility of your spine. Your caregiver may suggest losing weight if the extra weight makes the back pain worse.  Physical therapy.  Surgery to eliminate what is pressing or pinching the nerve, such as a bone spur or part of a herniated disk. HOME CARE INSTRUCTIONS   Only take over-the-counter or prescription medicines for pain or discomfort as directed by your caregiver.  Apply ice to the affected area for 20 minutes, 3-4 times a day for the first 48-72 hours. Then try heat in the same way.  Exercise, stretch, or perform your usual activities if these do not aggravate your pain.  Attend physical therapy sessions as directed by your caregiver.  Keep all follow-up appointments as directed by your caregiver.  Do not wear high heels or shoes that do not provide proper support.  Check your mattress to see if it is too soft. A firm mattress may lessen your pain and discomfort. SEEK IMMEDIATE MEDICAL CARE IF:   You lose control of your bowel or bladder (incontinence).  You have increasing weakness in the lower back, pelvis, buttocks,   or legs.  You have redness or swelling of your back.  You have a burning sensation when you urinate.  You have pain that gets worse when you lie down or awakens you at night.  Your pain is worse than you have experienced in the past.  Your pain is lasting longer than 4 weeks.  You are suddenly losing weight without reason. MAKE SURE YOU:  Understand these  instructions.  Will watch your condition.  Will get help right away if you are not doing well or get worse. Document Released: 01/31/2001 Document Revised: 08/08/2011 Document Reviewed: 06/18/2011 [employer] Patient Information 2015 [employer]. This information is not intended to replace advice given to you by your health care provider. Make sure you discuss any questions you have with your health care provider.  

## 2015-01-11 ENCOUNTER — Emergency Department (HOSPITAL_COMMUNITY): Payer: BLUE CROSS/BLUE SHIELD

## 2015-01-11 ENCOUNTER — Encounter (HOSPITAL_COMMUNITY): Payer: Self-pay | Admitting: Emergency Medicine

## 2015-01-11 ENCOUNTER — Emergency Department (HOSPITAL_COMMUNITY)
Admission: EM | Admit: 2015-01-11 | Discharge: 2015-01-11 | Disposition: A | Discharge status: Home / Self Care | Payer: BLUE CROSS/BLUE SHIELD | Attending: Emergency Medicine | Admitting: Emergency Medicine

## 2015-01-11 DIAGNOSIS — S161XXA Strain of muscle, fascia and tendon at neck level, initial encounter: Secondary | ICD-10-CM | POA: Diagnosis not present

## 2015-01-11 DIAGNOSIS — Y9389 Activity, other specified: Secondary | ICD-10-CM | POA: Diagnosis not present

## 2015-01-11 DIAGNOSIS — S79922A Unspecified injury of left thigh, initial encounter: Secondary | ICD-10-CM | POA: Diagnosis not present

## 2015-01-11 DIAGNOSIS — S199XXA Unspecified injury of neck, initial encounter: Secondary | ICD-10-CM | POA: Diagnosis present

## 2015-01-11 DIAGNOSIS — S0990XA Unspecified injury of head, initial encounter: Secondary | ICD-10-CM | POA: Insufficient documentation

## 2015-01-11 DIAGNOSIS — Y92410 Unspecified street and highway as the place of occurrence of the external cause: Secondary | ICD-10-CM | POA: Insufficient documentation

## 2015-01-11 DIAGNOSIS — S79921A Unspecified injury of right thigh, initial encounter: Secondary | ICD-10-CM | POA: Insufficient documentation

## 2015-01-11 DIAGNOSIS — S3992XA Unspecified injury of lower back, initial encounter: Secondary | ICD-10-CM | POA: Insufficient documentation

## 2015-01-11 DIAGNOSIS — Y998 Other external cause status: Secondary | ICD-10-CM | POA: Insufficient documentation

## 2015-01-11 MED ORDER — HYDROCODONE-ACETAMINOPHEN 5-325 MG PO TABS: ORAL_TABLET | ORAL | Status: DC

## 2015-01-11 MED ORDER — HYDROCODONE-ACETAMINOPHEN 5-325 MG PO TABS
1.0000 | ORAL_TABLET | Freq: Once | ORAL | Status: AC
  Administered 2015-01-11: 1 via ORAL
  Filled 2015-01-11: qty 1
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
## 2015-01-11 NOTE — Discharge Instructions (Signed)
Take vicodin for breakthrough pain, do not drink alcohol, drive, care for children or do other critical tasks while taking vicodin.  Please follow with your primary care doctor in the next 2 days for a check-up. They must obtain records for further management.   Do not hesitate to return to the Emergency Department for any new, worsening or concerning symptoms.    Cervical Sprain A cervical sprain is an injury in the neck in which the strong, fibrous tissues (ligaments) that connect your neck bones stretch or tear. Cervical sprains can range from mild to severe. Severe cervical sprains can cause the neck vertebrae to be unstable. This can lead to damage of the spinal cord and can result in serious nervous system problems. The amount of time it takes for a cervical sprain to get better depends on the cause and extent of the injury. Most cervical sprains heal in 1 to 3 weeks. CAUSES  Severe cervical sprains may be caused by:   Contact sport injuries (such as from football, rugby, wrestling, hockey, auto racing, gymnastics, diving, martial arts, or boxing).   Motor vehicle collisions.   Whiplash injuries. This is an injury from a sudden forward and backward whipping movement of the head and neck.  Falls.  Mild cervical sprains may be caused by:   Being in an awkward position, such as while cradling a telephone between your ear and shoulder.   Sitting in a chair that does not offer proper support.   Working at a poorly Marketing executivedesigned computer station.   Looking up or down for long periods of time.  SYMPTOMS   Pain, soreness, stiffness, or a burning sensation in the front, back, or sides of the neck. This discomfort may develop immediately after the injury or slowly, 24 hours or more after the injury.   Pain or tenderness directly in the middle of the back of the neck.   Shoulder or upper back pain.   Limited ability to move the neck.   Headache.   Dizziness.   Weakness,  numbness, or tingling in the hands or arms.   Muscle spasms.   Difficulty swallowing or chewing.   Tenderness and swelling of the neck.  DIAGNOSIS  Most of the time your health care provider can diagnose a cervical sprain by taking your history and doing a physical exam. Your health care provider will ask about previous neck injuries and any known neck problems, such as arthritis in the neck. X-rays may be taken to find out if there are any other problems, such as with the bones of the neck. Other tests, such as a CT scan or MRI, may also be needed.  TREATMENT  Treatment depends on the severity of the cervical sprain. Mild sprains can be treated with rest, keeping the neck in place (immobilization), and pain medicines. Severe cervical sprains are immediately immobilized. Further treatment is done to help with pain, muscle spasms, and other symptoms and may include:  Medicines, such as pain relievers, numbing medicines, or muscle relaxants.   Physical therapy. This may involve stretching exercises, strengthening exercises, and posture training. Exercises and improved posture can help stabilize the neck, strengthen muscles, and help stop symptoms from returning.  HOME CARE INSTRUCTIONS   Put ice on the injured area.   Put ice in a plastic bag.   Place a towel between your skin and the bag.   Leave the ice on for 15-20 minutes, 3-4 times a day.   If your injury was severe, you  may have been given a cervical collar to wear. A cervical collar is a two-piece collar designed to keep your neck from moving while it heals.  Do not remove the collar unless instructed by your health care provider.  If you have long hair, keep it outside of the collar.  Ask your health care provider before making any adjustments to your collar. Minor adjustments may be required over time to improve comfort and reduce pressure on your chin or on the back of your head.  Ifyou are allowed to remove the  collar for cleaning or bathing, follow your health care provider's instructions on how to do so safely.  Keep your collar clean by wiping it with mild soap and water and drying it completely. If the collar you have been given includes removable pads, remove them every 1-2 days and hand wash them with soap and water. Allow them to air dry. They should be completely dry before you wear them in the collar.  If you are allowed to remove the collar for cleaning and bathing, wash and dry the skin of your neck. Check your skin for irritation or sores. If you see any, tell your health care provider.  Do not drive while wearing the collar.   Only take over-the-counter or prescription medicines for pain, discomfort, or fever as directed by your health care provider.   Keep all follow-up appointments as directed by your health care provider.   Keep all physical therapy appointments as directed by your health care provider.   Make any needed adjustments to your workstation to promote good posture.   Avoid positions and activities that make your symptoms worse.   Warm up and stretch before being active to help prevent problems.  SEEK MEDICAL CARE IF:   Your pain is not controlled with medicine.   You are unable to decrease your pain medicine over time as planned.   Your activity level is not improving as expected.  SEEK IMMEDIATE MEDICAL CARE IF:   You develop any bleeding.  You develop stomach upset.  You have signs of an allergic reaction to your medicine.   Your symptoms get worse.   You develop new, unexplained symptoms.   You have numbness, tingling, weakness, or paralysis in any part of your body.  MAKE SURE YOU:   Understand these instructions.  Will watch your condition.  Will get help right away if you are not doing well or get worse.   This information is not intended to replace advice given to you by your health care provider. Make sure you discuss any  questions you have with your health care provider.   Document Released: 12/04/2006 Document Revised: 02/11/2013 Document Reviewed: 08/14/2012 Elsevier Interactive Patient Education [employer]
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
## 2016-05-29 ENCOUNTER — Emergency Department (HOSPITAL_COMMUNITY)
Admission: EM | Admit: 2016-05-29 | Discharge: 2016-05-29 | Disposition: A | Discharge status: Home / Self Care | Payer: BC Managed Care – PPO | Attending: Emergency Medicine | Admitting: Emergency Medicine

## 2016-05-29 ENCOUNTER — Ambulatory Visit (HOSPITAL_COMMUNITY)
Admission: EM | Admit: 2016-05-29 | Discharge: 2016-05-29 | Disposition: A | Discharge status: Nursing Home (Includes ICF) | Payer: BLUE CROSS/BLUE SHIELD

## 2016-05-29 ENCOUNTER — Encounter (HOSPITAL_COMMUNITY): Payer: Self-pay | Admitting: Emergency Medicine

## 2016-05-29 ENCOUNTER — Emergency Department (HOSPITAL_COMMUNITY): Payer: BC Managed Care – PPO

## 2016-05-29 ENCOUNTER — Other Ambulatory Visit: Payer: Self-pay

## 2016-05-29 DIAGNOSIS — R0789 Other chest pain: Secondary | ICD-10-CM | POA: Insufficient documentation

## 2016-05-29 DIAGNOSIS — R079 Chest pain, unspecified: Secondary | ICD-10-CM | POA: Diagnosis present

## 2016-05-29 LAB — CBC
HEMATOCRIT: 42.3 % (ref 36.0–46.0)
HEMOGLOBIN: 14.1 g/dL (ref 12.0–15.0)
MCH: 30.2 pg (ref 26.0–34.0)
MCHC: 33.3 g/dL (ref 30.0–36.0)
MCV: 90.6 fL (ref 78.0–100.0)
Platelets: 311 10*3/uL (ref 150–400)
RBC: 4.67 MIL/uL (ref 3.87–5.11)
RDW: 12.8 % (ref 11.5–15.5)
WBC: 7.8 10*3/uL (ref 4.0–10.5)

## 2016-05-29 LAB — BASIC METABOLIC PANEL
ANION GAP: 8 (ref 5–15)
BUN: 6 mg/dL (ref 6–20)
CO2: 25 mmol/L (ref 22–32)
Calcium: 8.8 mg/dL — ABNORMAL LOW (ref 8.9–10.3)
Chloride: 104 mmol/L (ref 101–111)
Creatinine, Ser: 0.64 mg/dL (ref 0.44–1.00)
GFR calc Af Amer: >60 mL/min (ref >60)
GFR calc non Af Amer: >60 mL/min (ref >60)
GLUCOSE: 116 mg/dL — AB (ref 65–99)
POTASSIUM: 3.6 mmol/L (ref 3.5–5.1)
Sodium: 137 mmol/L (ref 135–145)

## 2016-05-29 LAB — I-STAT TROPONIN, ED: Troponin i, poc: 0 ng/mL (ref 0.00–0.08)

## 2016-05-29 MED ORDER — CYCLOBENZAPRINE HCL 10 MG PO TABS: 10.0000 mg | ORAL_TABLET | Freq: Two times a day (BID) | ORAL | 0 refills | Status: DC | PRN

## 2016-05-29 NOTE — ED Triage Notes (Signed)
Pt states she has been having intermittent CP and SOB for a few weeks. The pain goes under her left breast and is stabbing in nature. Pt also feels nauseous at times

## 2016-05-29 NOTE — ED Provider Notes (Signed)
[facility]-EMERGENCY DEPT Provider Note   CSN: [id number] Arrival date & time: 05/29/16  1735     History   Chief Complaint Chief Complaint  Patient presents with  . Chest Pain    HPI Katrina Carlson is a 31 y.o. female.  HPI  This is a morbidly obese 31 year old female who presents today complaining of left sided chest pain which is sharp and nature occurring in short bursts. It radiates from the left breast around to the left side. Patient feels somewhat short of breath Occurs. It occurs sporadically. It has been occurring for several weeks. She denies any history of blood clots in her legs or lungs. She does have a On place. She denies any lateralized lower extremity swelling. She has had some nausea with one episode last night but no vomiting. Today she is not nauseated. She has a primary care doctor but has not followed with them. She denies any lesions, swelling, or rashes with states that she does get tender where her bra rubs in this area.  History reviewed. No pertinent past medical history.  There are no active problems to display for this patient.   Past Surgical History:  Procedure Laterality Date  . FRACTURE SURGERY    . WRIST SURGERY      OB History    No data available       Home Medications    Prior to Admission medications   Medication Sig Start Date End Date Taking? Authorizing Provider  HYDROcodone-acetaminophen (NORCO/VICODIN) 5-325 MG tablet Take 1-2 tablets by mouth every 6 hours as needed for pain and/or cough. 01/11/15   Nicole Pisciotta, PA-C  predniSONE (DELTASONE) 20 MG tablet 3 tabs po day one, then 2 po daily x 4 days 09/25/14   Rolan Bucco, MD    Family History History reviewed. No pertinent family history.  Social History Social History  Substance Use Topics  . Smoking status: Never Smoker  . Smokeless tobacco: Never Used  . Alcohol use No     Allergies   Patient has no known allergies.   Review of Systems Review of Systems    All other systems reviewed and are negative.    Physical Exam Updated Vital Signs BP 125/86 (BP Location: Left Arm)   Pulse 81   Temp 98 F (36.7 C) (Oral)   Resp 15   Ht  (1.702 m)   Wt (!) 145.2 kg   LMP 05/08/2016   SpO2 96%   BMI 50.12 kg/m   Physical Exam  Constitutional: She is oriented to person, place, and time.  HENT:  Head: Normocephalic and atraumatic.  Right Ear: External ear normal.  Left Ear: External ear normal.  Mouth/Throat: Oropharynx is clear and moist.  Eyes: Conjunctivae and EOM are normal. Pupils are equal, round, and reactive to light.  Neck: Normal range of motion.  Cardiovascular: Normal rate, regular rhythm and normal heart sounds.   Pulmonary/Chest: Effort normal and breath sounds normal. She exhibits tenderness.    Musculoskeletal: Normal range of motion. She exhibits no edema.  Neurological: She is alert and oriented to person, place, and time.  Skin: Skin is warm and dry. Capillary refill takes less than 2 seconds.  Psychiatric: She has a normal mood and affect.  Vitals reviewed.    ED Treatments / Results  Labs (all labs ordered are listed, but only abnormal results are displayed) Labs Reviewed  BASIC METABOLIC PANEL - Abnormal; Notable for the following:       Result  Value   Glucose, Bld 116 (*)    Calcium 8.8 (*)    All other components within normal limits  CBC  I-STAT TROPOININ, ED    EKG  EKG Interpretation  Date/Time:  Monday May 29 2016 17:51:32 EDT Ventricular Rate:  91 PR Interval:  148 QRS Duration: 78 QT Interval:  358 QTC Calculation: 440 R Axis:   65 Text Interpretation:  Normal sinus rhythm Normal ECG Confirmed by Omare Bilotta MD, Duwayne Heck [phone]) on 05/29/2016 7:11:24 PM       Radiology Dg Chest 2 View  Result Date: 05/29/2016 CLINICAL DATA:  Intermittent chest pain and shortness of breath. EXAM: CHEST  2 VIEW COMPARISON:  None. FINDINGS: The heart size and mediastinal contours are within normal limits.  Both lungs are clear. The visualized skeletal structures are unremarkable. Increased body habitus. IMPRESSION: No active cardiopulmonary disease. Electronically Signed   By: Elsie Stain M.D.   On: 05/29/2016 18:33    Procedures Procedures (including critical care time)  Medications Ordered in ED Medications - No data to display   Initial Impression / Assessment and Plan / ED Course  I have reviewed the triage vital signs and the nursing notes.  Pertinent labs & imaging results that were available during my care of the patient were reviewed by me and considered in my medical decision making (see chart for details).     72 female with left chest wall pain that is reproducible. She has normal lab work including troponin, normal chest x-Zalea Pete,, and normal EKG. I have low index of suspicion for ACS or other acute intra-thoracic pathology. She is advised of return precautions and need for follow-up voices understanding.  Final Clinical Impressions(s) / ED Diagnoses   Final diagnoses:  Chest wall pain    New Prescriptions New Prescriptions   No medications on file     Margarita Grizzle, MD 05/31/16 1404

## 2017-01-04 ENCOUNTER — Ambulatory Visit (INDEPENDENT_AMBULATORY_CARE_PROVIDER_SITE_OTHER): Payer: Worker's Compensation

## 2017-01-04 ENCOUNTER — Ambulatory Visit (INDEPENDENT_AMBULATORY_CARE_PROVIDER_SITE_OTHER): Payer: Worker's Compensation | Admitting: Physician Assistant

## 2017-01-04 ENCOUNTER — Ambulatory Visit (HOSPITAL_COMMUNITY)
Admission: EM | Admit: 2017-01-04 | Discharge: 2017-01-04 | Disposition: A | Discharge status: Other Acute Inpatient Hospital | Payer: BLUE CROSS/BLUE SHIELD

## 2017-01-04 ENCOUNTER — Encounter: Payer: Self-pay | Admitting: Physician Assistant

## 2017-01-04 VITALS — BP 148/88 | HR 85 | Temp 98.7°F | Resp 16 | Ht 67.0 in | Wt 339.0 lb

## 2017-01-04 DIAGNOSIS — S8991XA Unspecified injury of right lower leg, initial encounter: Secondary | ICD-10-CM

## 2017-01-04 DIAGNOSIS — S8011XA Contusion of right lower leg, initial encounter: Secondary | ICD-10-CM | POA: Diagnosis not present

## 2017-01-04 DIAGNOSIS — S8001XA Contusion of right knee, initial encounter: Secondary | ICD-10-CM | POA: Diagnosis not present

## 2017-01-04 MED ORDER — MELOXICAM 15 MG PO TABS: 15.0000 mg | ORAL_TABLET | Freq: Every day | ORAL | 0 refills | Status: DC

## 2017-01-04 NOTE — Patient Instructions (Addendum)
  Contusion A contusion is a deep bruise. Contusions happen when an injury causes bleeding under the skin. Symptoms of bruising include pain, swelling, and discolored skin. The skin may turn blue, purple, or yellow. Follow these instructions at home:  Rest the injured area.  If told, put ice on the injured area. ? Put ice in a plastic bag. ? Place a towel between your skin and the bag. ? Leave the ice on for 20 minutes, 2-3 times per day.  If told, put light pressure (compression) on the injured area using an elastic bandage. Make sure the bandage is not too tight. Remove it and put it back on as told by your doctor.  If possible, raise (elevate) the injured area above the level of your heart while you are sitting or lying down.  Take over-the-counter and prescription medicines only as told by your doctor. Contact a doctor if:  Your symptoms do not get better after several days of treatment.  Your symptoms get worse.  You have trouble moving the injured area. Get help right away if:  You have very bad pain.  You have a loss of feeling (numbness) in a hand or foot.  Your hand or foot turns pale or cold. This information is not intended to replace advice given to you by your health care provider. Make sure you discuss any questions you have with your health care provider. Document Released: 07/26/2007 Document Revised: 07/15/2015 Document Reviewed: 06/24/2014 Elsevier Interactive Patient Education  2018 Elsevier Inc.   Please ice the ankle three times per day for 15 minutes.   You can come out of the crutch when you feel like you can apply weight. I will have you follow up in 1 week. Please do not use ibuprofen or tylenol with the mobic.   IF you received an x-ray today, you will receive an invoice from [facility]. Please contact [facility] at [phone] with questions or concerns regarding your invoice.   IF you received labwork today, you will receive  an invoice from [facility]. Please contact [facility] at [phone] with questions or concerns regarding your invoice.   Our billing staff will not be able to assist you with questions regarding bills from these companies.  You will be contacted with the lab results as soon as they are available. The fastest way to get your results is to activate your My Chart account. Instructions are located on the last page of this paperwork. If you have not heard from us regarding the results in 2 weeks, please contact this office.

## 2017-01-04 NOTE — Progress Notes (Signed)
[facility] [address], [place] [address] [phone]  Date:  01/04/2017   Name:  Katrina Carlson   DOB:  20-Apr-1985   MRN:  [id number]  PCP:  Elias Elseeade, Robert, MD    History of Present Illness:  Katrina Carlson is a 31 y.o. female patient who presents to PCP with  Chief Complaint  Patient presents with  . Work Related Injury    pt fell at work this am  . Leg Injury    pain from thigh going down to calf     At 8 am, patient was walking into her work lobby, where it was wet.  She slipped following backwards with her right leg under her.  The pain was immediate at the right front of her leg.  From her thigh to her right foot.   No swelling that she could see.  The pain has worsened over the last 3 hours.  She notes that she took aleve 1000mg  today for the pain.  Mid thight to her shin.      There are no active problems to display for this patient.   History reviewed. No pertinent past medical history.  Past Surgical History:  Procedure Laterality Date  . FRACTURE SURGERY    . WRIST SURGERY      Social History   Tobacco Use  . Smoking status: Never Smoker  . Smokeless tobacco: Never Used  Substance Use Topics  . Alcohol use: No  . Drug use: No    History reviewed. No pertinent family history.  No Known Allergies  Medication list has been reviewed and updated.  Current Outpatient Medications on File Prior to Visit  Medication Sig Dispense Refill  . cyclobenzaprine (FLEXERIL) 10 MG tablet Take 1 tablet (10 mg total) by mouth 2 (two) times daily as needed for muscle spasms. (Patient not taking: Reported on 01/04/2017) 20 tablet 0  . HYDROcodone-acetaminophen (NORCO/VICODIN) 5-325 MG tablet Take 1-2 tablets by mouth every 6 hours as needed for pain and/or cough. (Patient not taking: Reported on 01/04/2017) 7 tablet 0  . predniSONE (DELTASONE) 20 MG tablet 3 tabs po day one, then 2 po daily x 4 days (Patient not taking: Reported on 01/04/2017) 11  tablet 0   No current facility-administered medications on file prior to visit.     ROS ROS otherwise unremarkable unless listed above.  Physical Examination: BP (!) 148/88   Pulse 85   Temp 98.7 F (37.1 C) (Oral)   Resp 16   Ht 5\' 7"  (1.702 m)   Wt (!) 339 lb (153.8 kg)   LMP  (LMP Unknown)   SpO2 97%   BMI 53.09 kg/m  Ideal Body Weight: Weight in (lb) to have BMI = 25: 159.3 Wt Readings from Last 3 Encounters:  01/04/17 (!) 339 lb (153.8 kg)  05/29/16 (!) 320 lb (145.2 kg)  09/25/14 (!) 330 lb (149.7 kg)    Physical Exam  Constitutional: She is oriented to person, place, and time. She appears well-developed and well-nourished. No distress.  HENT:  Head: Normocephalic and atraumatic.  Right Ear: External ear normal.  Left Ear: External ear normal.  Eyes: Conjunctivae and EOM are normal. Pupils are equal, round, and reactive to light.  Cardiovascular: Normal rate.  Pulmonary/Chest: Effort normal. No respiratory distress.  Musculoskeletal:  Right anterior tenderness along the center of thgh.   Normal rom and resisted extension.   Right knee tender along the inferior patella tendon and there is sign  of ecchymosis at the right medial just inferior to patella.  Tender along the more superior area of the anterior tibia.  No ecchymosis or swelling present.  Tender att the lateral malleolus.  Pain incited with passive flexion.  Tender distal joint of 1st metatarsal.   Neurological: She is alert and oriented to person, place, and time.  Skin: She is not diaphoretic.  Psychiatric: She has a normal mood and affect. Her behavior is normal.   Dg Tibia/fibula Right  Result Date: 01/04/2017 CLINICAL DATA:  Right lower extremity pain after fall today. EXAM: RIGHT TIBIA AND FIBULA - 2 VIEW COMPARISON:  None. FINDINGS: There is no evidence of fracture or other focal bone lesions. Soft tissues are unremarkable. IMPRESSION: Normal right tibia and fibula. Electronically Signed   By: Lupita RaiderJames   Green Jr, M.D.   On: 01/04/2017 13:24   Dg Ankle Complete Right  Result Date: 01/04/2017 CLINICAL DATA:  Right ankle pain after fall today. EXAM: RIGHT ANKLE - COMPLETE 3+ VIEW COMPARISON:  None. FINDINGS: There is no evidence of fracture, dislocation, or joint effusion. There is no evidence of arthropathy or other focal bone abnormality. Soft tissues are unremarkable. IMPRESSION: Normal right ankle. Electronically Signed   By: Lupita RaiderJames  Green Jr, M.D.   On: 01/04/2017 13:29   Dg Knee Complete 4 Views Right  Result Date: 01/04/2017 CLINICAL DATA:  Right knee pain after fall today. EXAM: RIGHT KNEE - COMPLETE 4+ VIEW COMPARISON:  None. FINDINGS: No evidence of fracture, dislocation, or joint effusion. No evidence of arthropathy or other focal bone abnormality. Soft tissues are unremarkable. IMPRESSION: Normal right knee. Electronically Signed   By: Lupita RaiderJames  Green Jr, M.D.   On: 01/04/2017 13:25   Dg Foot Complete Right  Result Date: 01/04/2017 CLINICAL DATA:  Right foot pain after fall today. EXAM: RIGHT FOOT COMPLETE - 3+ VIEW COMPARISON:  None. FINDINGS: There is no evidence of fracture or dislocation. There is no evidence of arthropathy or other focal bone abnormality. Soft tissues are unremarkable. IMPRESSION: No significant abnormality seen in the right foot. Electronically Signed   By: Lupita RaiderJames  Green Jr, M.D.   On: 01/04/2017 13:27      Assessment and Plan: Katrina Carlson is a 31 y.o. female who is here today  Advised anti-inflammatory use with precautions Crutches offered. Rtc in 10 days for follow up. Restriction letter given Contusion of multiple sites of right lower extremity, initial encounter  Injury of right lower extremity, initial encounter - Plan: DG Tibia/Fibula Right, DG Knee Complete 4 Views Right, DG Foot Complete Right, DG FEMUR, MIN 2 VIEWS RIGHT, DG Ankle Complete Right  Contusion of right knee, initial encounter  Trena PlattStephanie Marqus Macphee, PA-C [facility] 11/16/201810:39 AM

## 2017-01-05 ENCOUNTER — Encounter: Payer: Self-pay | Admitting: Physician Assistant

## 2017-01-10 ENCOUNTER — Other Ambulatory Visit: Payer: Self-pay

## 2017-01-10 ENCOUNTER — Ambulatory Visit (INDEPENDENT_AMBULATORY_CARE_PROVIDER_SITE_OTHER): Payer: Worker's Compensation | Admitting: Physician Assistant

## 2017-01-10 ENCOUNTER — Telehealth: Payer: Self-pay

## 2017-01-10 ENCOUNTER — Encounter: Payer: Self-pay | Admitting: Physician Assistant

## 2017-01-10 VITALS — BP 130/94 | HR 71 | Temp 98.6°F | Resp 16 | Ht 67.0 in | Wt 327.0 lb

## 2017-01-10 DIAGNOSIS — S8001XA Contusion of right knee, initial encounter: Secondary | ICD-10-CM | POA: Diagnosis not present

## 2017-01-10 DIAGNOSIS — S8991XA Unspecified injury of right lower leg, initial encounter: Secondary | ICD-10-CM | POA: Diagnosis not present

## 2017-01-10 DIAGNOSIS — S8011XA Contusion of right lower leg, initial encounter: Secondary | ICD-10-CM

## 2017-01-10 NOTE — Telephone Encounter (Signed)
Copied from CRM [id number]. Topic: Medical Record Request - Other >> Jan 09, 2017  2:01 PM Landry MellowFoltz, Melissa J wrote: Patient Name/DOB/MRN #: Katrina Carlson, 1985/11/03 Requestor Name/Agency: [facility] , attention triage Call Back #: [phone], fax to [phone] Information Requested: 01/04/17   Route to [facility] for [facility]. For all other clinics, route to the clinic's PEC Pool.

## 2017-01-10 NOTE — Patient Instructions (Signed)
     IF you received an x-ray today, you will receive an invoice from [facility]. Please contact [facility] at [phone] with questions or concerns regarding your invoice.   IF you received labwork today, you will receive an invoice from [facility]. Please contact [facility] at [phone] with questions or concerns regarding your invoice.   Our billing staff will not be able to assist you with questions regarding bills from these companies.  You will be contacted with the lab results as soon as they are available. The fastest way to get your results is to activate your My Chart account. Instructions are located on the last page of this paperwork. If you have not heard from us regarding the results in 2 weeks, please contact this office.     

## 2017-01-10 NOTE — Progress Notes (Signed)
[facility] [address], [place] [address] [phone]  Date:  01/10/2017   Name:  Tiajuana Amassatrice N Mihok   DOB:  04/09/1985   MRN:  [id number]  PCP:  Elias Elseeade, Robert, MD    History of Present Illness:  Tiajuana Amassatrice N Biskup is a 31 y.o. female patient who presents to PCP with  Chief Complaint  Patient presents with  . Follow-up    right knee, foot is really sore     Right knee pain and foot is very sore in the follow up after 1 week following a fall on slippery floor when she fell falling in a sitting position on her right leg.   She has noticed prominent bruising on her right leg.     There are no active problems to display for this patient.   History reviewed. No pertinent past medical history.   History reviewed. No pertinent family history.  No Known Allergies  Medication list has been reviewed and updated.   ROS ROS otherwise unremarkable unless listed above.  Physical Examination: BP 136/90   Pulse 71   Temp 98.6 F (37 C) (Oral)   Resp 16   Ht 5\' 7"  (1.702 m)   Wt (!) 327 lb (148.3 kg)   LMP  (LMP Unknown) Comment: Nexplanon  SpO2 97%   BMI 51.22 kg/m  Ideal Body Weight: Weight in (lb) to have BMI = 25: 159.3  Physical Exam  Musculoskeletal:       Right knee: She exhibits ecchymosis (just inferior medial to her right patella). She exhibits normal range of motion, no swelling, no LCL laxity, normal patellar mobility and no MCL laxity. No medial joint line, no lateral joint line, no MCL, no LCL and no patellar tendon tenderness noted.       Right ankle: She exhibits normal range of motion and no ecchymosis. Achilles tendon normal.  Tender just anteriorly to the lateral malleolus without tenderness to the malleolar bone.  There is normal rom passively.  No swelling at this area       Assessment and Plan: Tiajuana Amassatrice N Busker is a 31 y.o. female who is here today for cc of  Chief Complaint  Patient presents with  . Follow-up    right knee,  foot is really sore  --continue restrictions.  Advised the rice and rom exercises.  Ace bandaged to compress the contusion.   --follow up in 1 week.  Contusion of multiple sites of right lower extremity, initial encounter  Injury of right lower extremity, initial encounter  Contusion of right knee, initial encounter  Trena PlattStephanie Winona Sison, PA-C [facility] 11/27/20189:22 AM

## 2017-01-24 ENCOUNTER — Ambulatory Visit: Payer: Self-pay | Admitting: Physician Assistant

## 2017-02-01 ENCOUNTER — Encounter: Payer: Self-pay | Admitting: Physician Assistant

## 2017-02-01 ENCOUNTER — Ambulatory Visit (INDEPENDENT_AMBULATORY_CARE_PROVIDER_SITE_OTHER): Payer: Worker's Compensation | Admitting: Physician Assistant

## 2017-02-01 VITALS — BP 122/76 | HR 87 | Temp 98.5°F | Resp 16 | Ht 67.0 in | Wt 327.0 lb

## 2017-02-01 DIAGNOSIS — S8001XD Contusion of right knee, subsequent encounter: Secondary | ICD-10-CM

## 2017-02-01 DIAGNOSIS — S8011XD Contusion of right lower leg, subsequent encounter: Secondary | ICD-10-CM

## 2017-02-01 DIAGNOSIS — S8991XD Unspecified injury of right lower leg, subsequent encounter: Secondary | ICD-10-CM

## 2017-02-01 NOTE — Progress Notes (Signed)
[facility] [address], [place] [phone]  Date:  02/01/2017   Name:  Katrina Carlson   DOB:  04-May-1985   MRN:  [id number]  PCP:  Elias Elseeade, Robert, MD    History of Present Illness:  Katrina Amassatrice N Harbold is a 31 y.o. female patient who presents to PCP with  Chief Complaint  Patient presents with  . Follow-up    workers comp/ pt feeling better     Patient continues to improve and states that she is feeling improvement.  Her knee has improved with elevation and bandaging.  She is keeping it elevated at work, which she notes helps a lot.  She still has some pain in the knee but altogether improved.  No instability.  No paresthesia.  She is concerned that if she is sitting with her knee down, that the knee will start to hurt again.  Ankle pain resolving.   There are no active problems to display for this patient.   History reviewed. No pertinent past medical history.  Past Surgical History:  Procedure Laterality Date  . FRACTURE SURGERY    . WRIST SURGERY      Social History   Tobacco Use  . Smoking status: Never Smoker  . Smokeless tobacco: Never Used  Substance Use Topics  . Alcohol use: No  . Drug use: No    History reviewed. No pertinent family history.  No Known Allergies  Medication list has been reviewed and updated.  Current Outpatient Medications on File Prior to Visit  Medication Sig Dispense Refill  . cyclobenzaprine (FLEXERIL) 10 MG tablet Take 1 tablet (10 mg total) by mouth 2 (two) times daily as needed for muscle spasms. (Patient not taking: Reported on 02/01/2017) 20 tablet 0  . HYDROcodone-acetaminophen (NORCO/VICODIN) 5-325 MG tablet Take 1-2 tablets by mouth every 6 hours as needed for pain and/or cough. (Patient not taking: Reported on 02/01/2017) 7 tablet 0  . meloxicam (MOBIC) 15 MG tablet Take 1 tablet (15 mg total) daily by mouth. (Patient not taking: Reported on 02/01/2017) 30 tablet 0  . predniSONE (DELTASONE) 20  MG tablet 3 tabs po day one, then 2 po daily x 4 days (Patient not taking: Reported on 02/01/2017) 11 tablet 0   No current facility-administered medications on file prior to visit.     ROS ROS otherwise unremarkable unless listed above.  Physical Examination: BP 122/76   Pulse 87   Temp 98.5 F (36.9 C) (Oral)   Resp 16   Ht 5\' 7"  (1.702 m)   Wt (!) 327 lb (148.3 kg)   LMP  (LMP Unknown) Comment: Nexplanon  SpO2 98%   BMI 51.22 kg/m  Ideal Body Weight: Weight in (lb) to have BMI = 25: 159.3  Physical Exam  Constitutional: She is oriented to person, place, and time. She appears well-developed and well-nourished. No distress.  HENT:  Head: Normocephalic and atraumatic.  Right Ear: External ear normal.  Left Ear: External ear normal.  Eyes: Conjunctivae and EOM are normal. Pupils are equal, round, and reactive to light.  Cardiovascular: Normal rate.  Pulmonary/Chest: Effort normal. No respiratory distress.  Musculoskeletal:       Right knee: She exhibits normal range of motion, no swelling, no ecchymosis, no erythema, normal alignment, no LCL laxity and no MCL laxity. No medial joint line, no lateral joint line, no MCL and no LCL tenderness noted.  Medial inferior to patella, there is some tenderness.   Neurological: She is  alert and oriented to person, place, and time.  Skin: She is not diaphoretic.  Psychiatric: She has a normal mood and affect. Her behavior is normal.     Assessment and Plan: Katrina Amassatrice N Balles is a 31 y.o. female who is here today for cc of  Chief Complaint  Patient presents with  . Follow-up    workers comp/ pt feeling better   Due to patient not quite at baseline, and her concern, we will revisit in 2 weeks.  She will likely be placed off of her restrictions at this time.   Contusion of multiple sites of right lower extremity, subsequent encounter  Injury of right lower extremity, subsequent encounter  Contusion of right knee, subsequent  encounter  Trena PlattStephanie Ilithyia Titzer, PA-C [facility] 12/24/20188:05 AM

## 2017-02-01 NOTE — Patient Instructions (Addendum)
  Continue regimen of icing the knee.  IF you received an x-ray today, you will receive an invoice from [facility]. Please contact [facility] at [phone] with questions or concerns regarding your invoice.   IF you received labwork today, you will receive an invoice from [facility]. Please contact [facility] at [phone] with questions or concerns regarding your invoice.   Our billing staff will not be able to assist you with questions regarding bills from these companies.  You will be contacted with the lab results as soon as they are available. The fastest way to get your results is to activate your My Chart account. Instructions are located on the last page of this paperwork. If you have not heard from us regarding the results in 2 weeks, please contact this office.

## 2017-02-15 ENCOUNTER — Ambulatory Visit (INDEPENDENT_AMBULATORY_CARE_PROVIDER_SITE_OTHER): Payer: Worker's Compensation | Admitting: Physician Assistant

## 2017-02-15 ENCOUNTER — Encounter: Payer: Self-pay | Admitting: Physician Assistant

## 2017-02-15 ENCOUNTER — Other Ambulatory Visit: Payer: Self-pay

## 2017-02-15 VITALS — BP 122/88 | HR 104 | Temp 98.0°F | Resp 16 | Ht 67.0 in | Wt 326.0 lb

## 2017-02-15 DIAGNOSIS — S8011XD Contusion of right lower leg, subsequent encounter: Secondary | ICD-10-CM

## 2017-02-15 DIAGNOSIS — Z09 Encounter for follow-up examination after completed treatment for conditions other than malignant neoplasm: Secondary | ICD-10-CM | POA: Diagnosis not present

## 2017-02-15 DIAGNOSIS — S8001XD Contusion of right knee, subsequent encounter: Secondary | ICD-10-CM

## 2017-02-15 DIAGNOSIS — S8991XD Unspecified injury of right lower leg, subsequent encounter: Secondary | ICD-10-CM

## 2017-02-15 NOTE — Progress Notes (Signed)
[facility] [address], [place] [address] [phone]  Date:  02/15/2017   Name:  Katrina Carlson   DOB:  04/29/1985   MRN:  [id number]  PCP:  Elias Elseeade, Robert, MD    History of Present Illness:  Katrina Amassatrice N Infinger is a 31 y.o. female patient who presents to PCP with  Chief Complaint  Patient presents with  . Follow-up    fall/ leg pain/ pt states she is feeling better     Patient is here for worker's compensation follow up after slipping on wet floor at work.  She has had symptoms of contusion of both knee and ankle.  At this time, she reports that her symptoms have resolved.  She does not have the pain or swelling any longer at the localized area just medial to patella.  No pain with ambulation.  No pulling sensation with ambulation at her right ankle.    History reviewed. No pertinent family history.  No Known Allergies  Medication list has been reviewed and updated.  Current Outpatient Medications on File Prior to Visit  Medication Sig Dispense Refill  . cyclobenzaprine (FLEXERIL) 10 MG tablet Take 1 tablet (10 mg total) by mouth 2 (two) times daily as needed for muscle spasms. (Patient not taking: Reported on 02/01/2017) 20 tablet 0  . HYDROcodone-acetaminophen (NORCO/VICODIN) 5-325 MG tablet Take 1-2 tablets by mouth every 6 hours as needed for pain and/or cough. (Patient not taking: Reported on 02/01/2017) 7 tablet 0  . meloxicam (MOBIC) 15 MG tablet Take 1 tablet (15 mg total) daily by mouth. (Patient not taking: Reported on 02/01/2017) 30 tablet 0  . predniSONE (DELTASONE) 20 MG tablet 3 tabs po day one, then 2 po daily x 4 days (Patient not taking: Reported on 02/01/2017) 11 tablet 0   No current facility-administered medications on file prior to visit.     ROS ROS otherwise unremarkable unless listed above.  Physical Examination: BP 122/88   Pulse (!) 104   Temp 98 F (36.7 C) (Oral)   Resp 16   Ht 5\' 7"  (1.702 m)   Wt (!) 326 lb (147.9  kg)   SpO2 95%   BMI 51.06 kg/m  Ideal Body Weight: Weight in (lb) to have BMI = 25: 159.3  Physical Exam  Constitutional: She is oriented to person, place, and time. She appears well-developed and well-nourished. No distress.  Pulmonary/Chest: Effort normal. No respiratory distress.  Musculoskeletal:       Right knee: Normal. She exhibits normal range of motion, no swelling, no LCL laxity, no bony tenderness and no MCL laxity. No tenderness found.       Right ankle: She exhibits normal range of motion and no swelling. No tenderness.  Neurological: She is alert and oriented to person, place, and time.  Skin: She is not diaphoretic.  Psychiatric: She has a normal mood and affect. Her behavior is normal.     Assessment and Plan: Katrina Amassatrice N Gaubert is a 31 y.o. female who is here today for cc of  Chief Complaint  Patient presents with  . Follow-up    fall/ leg pain/ pt states she is feeling better  released from restrictions.  Follow up within 1 week if symtpoms resurface.  Contusion of multiple sites of right lower extremity, subsequent encounter  Contusion of right knee, subsequent encounter  Injury of right lower extremity, subsequent encounter  Follow up  Trena PlattStephanie English, PA-C [facility]  12/27/201810:26 AM

## 2017-02-15 NOTE — Patient Instructions (Signed)
     IF you received an x-ray today, you will receive an invoice from [facility]. Please contact [facility] at [phone] with questions or concerns regarding your invoice.   IF you received labwork today, you will receive an invoice from [facility]. Please contact [facility] at [phone] with questions or concerns regarding your invoice.   Our billing staff will not be able to assist you with questions regarding bills from these companies.  You will be contacted with the lab results as soon as they are available. The fastest way to get your results is to activate your My Chart account. Instructions are located on the last page of this paperwork. If you have not heard from us regarding the results in 2 weeks, please contact this office.     

## 2017-05-23 ENCOUNTER — Encounter: Payer: Self-pay | Admitting: Physician Assistant

## 2018-11-24 ENCOUNTER — Inpatient Hospital Stay (HOSPITAL_COMMUNITY)
Admission: EM | Admit: 2018-11-24 | Discharge: 2018-12-03 | DRG: 177 | Disposition: A | Discharge status: Home / Self Care | Payer: BC Managed Care – PPO | Attending: Internal Medicine | Admitting: Internal Medicine

## 2018-11-24 ENCOUNTER — Other Ambulatory Visit: Payer: Self-pay

## 2018-11-24 ENCOUNTER — Emergency Department (HOSPITAL_COMMUNITY): Payer: BC Managed Care – PPO

## 2018-11-24 ENCOUNTER — Encounter (HOSPITAL_COMMUNITY): Payer: Self-pay | Admitting: *Deleted

## 2018-11-24 DIAGNOSIS — Z6841 Body Mass Index (BMI) 40.0 and over, adult: Secondary | ICD-10-CM

## 2018-11-24 DIAGNOSIS — J069 Acute upper respiratory infection, unspecified: Secondary | ICD-10-CM

## 2018-11-24 DIAGNOSIS — R0602 Shortness of breath: Secondary | ICD-10-CM | POA: Diagnosis present

## 2018-11-24 DIAGNOSIS — J1289 Other viral pneumonia: Secondary | ICD-10-CM | POA: Diagnosis present

## 2018-11-24 DIAGNOSIS — J9601 Acute respiratory failure with hypoxia: Secondary | ICD-10-CM | POA: Diagnosis present

## 2018-11-24 DIAGNOSIS — U071 COVID-19: Secondary | ICD-10-CM

## 2018-11-24 DIAGNOSIS — J189 Pneumonia, unspecified organism: Secondary | ICD-10-CM | POA: Diagnosis not present

## 2018-11-24 DIAGNOSIS — J1282 Pneumonia due to coronavirus disease 2019: Secondary | ICD-10-CM

## 2018-11-24 DIAGNOSIS — R0902 Hypoxemia: Secondary | ICD-10-CM

## 2018-11-24 LAB — FIBRINOGEN: Fibrinogen: 533 mg/dL — ABNORMAL HIGH (ref 210–475)

## 2018-11-24 LAB — COMPREHENSIVE METABOLIC PANEL
ALT: 90 U/L — ABNORMAL HIGH (ref 0–44)
AST: 99 U/L — ABNORMAL HIGH (ref 15–41)
Albumin: 3.5 g/dL (ref 3.5–5.0)
Alkaline Phosphatase: 50 U/L (ref 38–126)
Anion gap: 11 (ref 5–15)
BUN: 8 mg/dL (ref 6–20)
CO2: 22 mmol/L (ref 22–32)
Calcium: 8.3 mg/dL — ABNORMAL LOW (ref 8.9–10.3)
Chloride: 101 mmol/L (ref 98–111)
Creatinine, Ser: 0.83 mg/dL (ref 0.44–1.00)
GFR calc Af Amer: >60 mL/min (ref >60)
GFR calc non Af Amer: >60 mL/min (ref >60)
Glucose, Bld: 100 mg/dL — ABNORMAL HIGH (ref 70–99)
Potassium: 3.7 mmol/L (ref 3.5–5.1)
Sodium: 134 mmol/L — ABNORMAL LOW (ref 135–145)
Total Bilirubin: 0.6 mg/dL (ref 0.3–1.2)
Total Protein: 8.1 g/dL (ref 6.5–8.1)

## 2018-11-24 LAB — CBC WITH DIFFERENTIAL/PLATELET
Abs Immature Granulocytes: 0.02 10*3/uL (ref 0.00–0.07)
Basophils Absolute: 0 10*3/uL (ref 0.0–0.1)
Basophils Relative: 0 %
Eosinophils Absolute: 0 10*3/uL (ref 0.0–0.5)
Eosinophils Relative: 0 %
HCT: 50.6 % — ABNORMAL HIGH (ref 36.0–46.0)
Hemoglobin: 16.2 g/dL — ABNORMAL HIGH (ref 12.0–15.0)
Immature Granulocytes: 0 %
Lymphocytes Relative: 21 %
Lymphs Abs: 1 10*3/uL (ref 0.7–4.0)
MCH: 30.1 pg (ref 26.0–34.0)
MCHC: 32 g/dL (ref 30.0–36.0)
MCV: 94.1 fL (ref 80.0–100.0)
Monocytes Absolute: 0.5 10*3/uL (ref 0.1–1.0)
Monocytes Relative: 11 %
Neutro Abs: 3 10*3/uL (ref 1.7–7.7)
Neutrophils Relative %: 68 %
Platelets: 259 10*3/uL (ref 150–400)
RBC: 5.38 MIL/uL — ABNORMAL HIGH (ref 3.87–5.11)
RDW: 12.7 % (ref 11.5–15.5)
WBC: 4.5 10*3/uL (ref 4.0–10.5)
nRBC: 0 % (ref 0.0–0.2)

## 2018-11-24 LAB — PROCALCITONIN: Procalcitonin: <0.1 ng/mL

## 2018-11-24 LAB — TRIGLYCERIDES: Triglycerides: 114 mg/dL (ref <150)

## 2018-11-24 LAB — I-STAT BETA HCG BLOOD, ED (MC, WL, AP ONLY): I-stat hCG, quantitative: <5 m[IU]/mL (ref <5)

## 2018-11-24 LAB — FERRITIN: Ferritin: 179 ng/mL (ref 11–307)

## 2018-11-24 LAB — LACTIC ACID, PLASMA: Lactic Acid, Venous: 1 mmol/L (ref 0.5–1.9)

## 2018-11-24 LAB — C-REACTIVE PROTEIN: CRP: 1.8 mg/dL — ABNORMAL HIGH (ref <1.0)

## 2018-11-24 LAB — LACTATE DEHYDROGENASE: LDH: 319 U/L — ABNORMAL HIGH (ref 98–192)

## 2018-11-24 LAB — D-DIMER, QUANTITATIVE: D-Dimer, Quant: 0.77 ug/mL-FEU — ABNORMAL HIGH (ref 0.00–0.50)

## 2018-11-24 MED ORDER — VITAMIN C 500 MG PO TABS
500.0000 mg | ORAL_TABLET | Freq: Every day | ORAL | Status: DC
  Administered 2018-11-25 – 2018-12-03 (×9): 500 mg via ORAL
  Filled 2018-11-24 (×10): qty 1

## 2018-11-24 MED ORDER — ONDANSETRON HCL 4 MG PO TABS: 4.0000 mg | ORAL_TABLET | Freq: Four times a day (QID) | ORAL | Status: DC | PRN

## 2018-11-24 MED ORDER — GUAIFENESIN-DM 100-10 MG/5ML PO SYRP
10.0000 mL | ORAL_SOLUTION | ORAL | Status: DC | PRN
  Administered 2018-11-25 – 2018-11-30 (×4): 10 mL via ORAL
  Filled 2018-11-24 (×4): qty 10

## 2018-11-24 MED ORDER — IPRATROPIUM-ALBUTEROL 20-100 MCG/ACT IN AERS
1.0000 | INHALATION_SPRAY | Freq: Four times a day (QID) | RESPIRATORY_TRACT | Status: DC
  Administered 2018-11-25 – 2018-12-03 (×32): 1 via RESPIRATORY_TRACT
  Filled 2018-11-24: qty 4

## 2018-11-24 MED ORDER — SODIUM CHLORIDE 0.9 % IV SOLN
500.0000 mg | Freq: Once | INTRAVENOUS | Status: AC
  Administered 2018-11-24: 22:00:00 500 mg via INTRAVENOUS
  Filled 2018-11-24: qty 500

## 2018-11-24 MED ORDER — ACETAMINOPHEN 325 MG PO TABS
650.0000 mg | ORAL_TABLET | Freq: Four times a day (QID) | ORAL | Status: DC | PRN
  Administered 2018-11-25: 650 mg via ORAL
  Filled 2018-11-24: qty 2

## 2018-11-24 MED ORDER — SODIUM CHLORIDE 0.9 % IV SOLN
1.0000 g | Freq: Once | INTRAVENOUS | Status: AC
  Administered 2018-11-24: 1 g via INTRAVENOUS
  Filled 2018-11-24: qty 10

## 2018-11-24 MED ORDER — METHYLPREDNISOLONE SODIUM SUCC 125 MG IJ SOLR
0.5000 mg/kg | Freq: Two times a day (BID) | INTRAMUSCULAR | Status: DC
  Administered 2018-11-24 – 2018-12-03 (×18): 75.625 mg via INTRAVENOUS
  Filled 2018-11-24 (×18): qty 2

## 2018-11-24 MED ORDER — HYDROCOD POLST-CPM POLST ER 10-8 MG/5ML PO SUER
5.0000 mL | Freq: Two times a day (BID) | ORAL | Status: DC | PRN
  Filled 2018-11-24: qty 5

## 2018-11-24 MED ORDER — ONDANSETRON HCL 4 MG/2ML IJ SOLN
4.0000 mg | Freq: Four times a day (QID) | INTRAMUSCULAR | Status: DC | PRN
  Administered 2018-11-24: 4 mg via INTRAVENOUS
  Filled 2018-11-24: qty 2

## 2018-11-24 MED ORDER — ENOXAPARIN SODIUM 80 MG/0.8ML ~~LOC~~ SOLN
80.0000 mg | SUBCUTANEOUS | Status: DC
  Administered 2018-11-25 – 2018-12-03 (×9): 80 mg via SUBCUTANEOUS
  Filled 2018-11-24 (×9): qty 0.8

## 2018-11-24 MED ORDER — ZINC SULFATE 220 (50 ZN) MG PO CAPS
220.0000 mg | ORAL_CAPSULE | Freq: Every day | ORAL | Status: DC
  Administered 2018-11-25 – 2018-12-03 (×9): 220 mg via ORAL
  Filled 2018-11-24 (×10): qty 1

## 2018-11-24 MED ORDER — ACETAMINOPHEN 500 MG PO TABS
1000.0000 mg | ORAL_TABLET | Freq: Once | ORAL | Status: AC
  Administered 2018-11-24: 1000 mg via ORAL
  Filled 2018-11-24: qty 2

## 2018-11-24 NOTE — ED Provider Notes (Signed)
9:11 PM signout from [employer] at shift change.   Patient with recent positive COVID tests, presents with shortness of breath and hypoxia.  Improved on 2 L nasal cannula.  She weighs 152 kg.  Chest x-ray demonstrates right middle lobe consolidation concerning for secondary bacterial pneumonia.  Patient will be started on Rocephin and azithromycin for this.  I spoke with Dr. Posey Pronto of [facility] hospitalist who will see the patient in the emergency department.  BP (!) 148/130 (BP Location: Left Arm)   Pulse (!) 112   Temp 98.5 F (36.9 C) (Oral)   Resp (!) 29   Ht 5\' 7"  (1.702 m)   Wt (!) 151.5 kg   LMP 11/18/2018   SpO2 96%   BMI 52.31 kg/m     Carlisle Cater, PA-C 11/24/18 2112    Tegeler, Gwenyth Allegra, MD 11/24/18 2348

## 2018-11-24 NOTE — ED Provider Notes (Signed)
[facility]-EMERGENCY DEPT Provider Note   CSN: [id number] Arrival date & time: 11/24/18  1819     History   Chief Complaint No chief complaint on file.   HPI Katrina Carlson is a 33 y.o. female.     HPI   33 year old female, morbidly obese, presents with a positive coronavirus diagnosis and shortness of breath.  She states that she was not feeling well last night a.m. 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  There she received a rapid nasal COVID swab.  The test came back positive.  Patient has proof of this result on her phone at bedside.  She states that since then she has had gradually worsening shortness of breath, myalgias, cough, rhinorrhea.  She notes nausea but denies any vomiting or abdominal pain.  She states repeatedly "I cannot breathe" during the history and physical.  No past medical history on file.  There are no active problems to display for this patient.   Past Surgical History:  Procedure Laterality Date  . FRACTURE SURGERY    . WRIST SURGERY       OB History   No obstetric history on file.      Home Medications    Prior to Admission medications   Medication Sig Start Date End Date Taking? Authorizing Provider  cyclobenzaprine (FLEXERIL) 10 MG tablet Take 1 tablet (10 mg total) by mouth 2 (two) times daily as needed for muscle spasms. Patient not taking: Reported on 02/01/2017 05/29/16   Margarita Grizzle, MD  HYDROcodone-acetaminophen (NORCO/VICODIN) 5-325 MG tablet Take 1-2 tablets by mouth every 6 hours as needed for pain and/or cough. Patient not taking: Reported on 02/01/2017 01/11/15   Pisciotta, Joni Reining, PA-C  meloxicam (MOBIC) 15 MG tablet Take 1 tablet (15 mg total) daily by mouth. Patient not taking: Reported on 02/01/2017 01/04/17   Trena Platt D, PA  predniSONE (DELTASONE) 20 MG tablet 3 tabs po day one, then 2 po daily x 4 days Patient not taking: Reported on 02/01/2017 09/25/14   Rolan Bucco, MD    Family History No  family history on file.  Social History Social History   Tobacco Use  . Smoking status: Never Smoker  . Smokeless tobacco: Never Used  Substance Use Topics  . Alcohol use: No  . Drug use: No     Allergies   Patient has no known allergies.   Review of Systems Review of Systems  Constitutional: Positive for chills and fever.  HENT: Positive for rhinorrhea. Negative for sore throat.   Eyes: Negative for visual disturbance.  Respiratory: Positive for cough and shortness of breath.   Cardiovascular: Negative for chest pain and leg swelling.  Gastrointestinal: Positive for nausea. Negative for abdominal pain, diarrhea and vomiting.  Genitourinary: Negative for dysuria, frequency and urgency.  Musculoskeletal: Positive for myalgias. Negative for joint swelling and neck pain.  Skin: Negative for rash and wound.  Neurological: Negative for syncope and numbness.  All other systems reviewed and are negative.    Physical Exam Updated Vital Signs SpO2 97% Comment: 2L [place]  Physical Exam Vitals signs and nursing note reviewed.  Constitutional:      Appearance: She is well-developed. She is obese.  HENT:     Head: Normocephalic and atraumatic.  Eyes:     Conjunctiva/sclera: Conjunctivae normal.  Neck:     Musculoskeletal: Neck supple.  Cardiovascular:     Rate and Rhythm: Regular rhythm. Tachycardia present.     Heart sounds: Normal heart sounds. No murmur.  Pulmonary:     Effort: Tachypnea and accessory muscle usage present. No respiratory distress.     Breath sounds: Normal breath sounds. No wheezing or rales.  Abdominal:     General: Bowel sounds are normal. There is no distension.     Palpations: Abdomen is soft.     Tenderness: There is no abdominal tenderness.  Musculoskeletal: Normal range of motion.        General: No tenderness or deformity.  Skin:    General: Skin is warm and dry.     Findings: No erythema or rash.  Neurological:     Mental Status: She is  alert and oriented to person, place, and time.  Psychiatric:        Behavior: Behavior normal.      ED Treatments / Results  Labs (all labs ordered are listed, but only abnormal results are displayed) Labs Reviewed  CULTURE, BLOOD (ROUTINE X 2)  CULTURE, BLOOD (ROUTINE X 2)  LACTIC ACID, PLASMA  LACTIC ACID, PLASMA  CBC WITH DIFFERENTIAL/PLATELET  COMPREHENSIVE METABOLIC PANEL  D-DIMER, QUANTITATIVE (NOT AT [facility])  PROCALCITONIN  LACTATE DEHYDROGENASE  FERRITIN  TRIGLYCERIDES  FIBRINOGEN  C-REACTIVE PROTEIN  I-STAT BETA HCG BLOOD, ED ([facility], [facility], [facility] ONLY)    EKG None  Radiology No results found.  Procedures Procedures (including critical care time)  Medications Ordered in ED Medications  acetaminophen (TYLENOL) tablet 1,000 mg (has no administration in time range)     Initial Impression / Assessment and Plan / ED Course  I have reviewed the triage vital signs and the nursing notes.  Pertinent labs & imaging results that were available during my care of the patient were reviewed by me and considered in my medical decision making (see chart for details).        1845: Attempted to ambulate the patient around the room.  She was satting 91% on room air with minimal movement and very tachypneic.  I placed her back on 2 L nasal cannula.  Continue to observe her and her respiratory rate improved and oxygen improved.   Patient presented with a positive coronavirus test done 2 days ago and increased shortness of breath.  On initial evaluation patient on 2 L nasal cannula satting 97%.  She was taken off of oxygen and dropped her O2 saturations to 91%.  Attempted to move the patient to ambulate her and she had a very difficult time with this and became very tachypneic.  She had increased work of breathing.  She was placed back on 2 L and her tachypnea improved and oxygen improved.  Her lungs are clear to auscultation although difficult exam due to body habitus.  Her abdomen is  soft and nontender to palpation.  She has no peripheral edema.  Her chest x-ray shows findings consistent with COVID and also shows a possible superimposed bacterial infection.  Ceftriaxone and azithromycin ordered.  Informed from the nurse that they are unable to obtain second blood culture.  Blood work is pending.  Signout given to incoming PA [employer].  He will follow-up on her blood work.  Patient will need admission due to hypoxia and tachypnea.  Final Clinical Impressions(s) / ED Diagnoses   Final diagnoses:  None    ED Discharge Orders    None       Rachel Moulds 11/24/18 2008    Maudie Flakes, MD 11/24/18 2124

## 2018-11-24 NOTE — ED Notes (Signed)
[facility] called for transport. 

## 2018-11-24 NOTE — H&P (Signed)
History and Physical    [name]:[id number] DOB: 1986/02/02 DOA: 11/24/2018  PCP: Louretta Shorten, MD  Patient coming from: Home  I have personally briefly reviewed patient's old medical records in [place]  Chief Complaint: Shortness of breath  HPI: Katrina Carlson is a 32 y.o. female with medical history significant for morbid obesity who presents to the ED for evaluation of progressive worsening shortness of breath, cough productive of scant yellow/brown sputum, myalgias, rhinorrhea, subjective fevers, diaphoresis, chills, and nausea without vomiting.  Patient states symptoms began on 11/20/2018.  She went to her school clinic at [facility] [employer] and was tested for COVID-19 with positive result on 11/22/2018 (patient has results on her phone).  She attempted management at home but had continued progressive symptoms therefore presented to the ED for further evaluation.  Patient denies any smoking, alcohol, or illicit drug use.  She reports a history of breast cancer in her mother and diabetes in her father.   ED Course:  Initial vitals showed BP 120/73, pulse 108, RR 26, temp 100.1 Fahrenheit, SPO2 reportedly 90% on room air improved to 96% on 2 L supplemental O2 via [place].  Labs notable for WBC 4.5, hemoglobin 16.2, platelets 259,000, sodium 134, potassium 3.7, bicarb 22, BUN 8, creatinine 0.83, AST 99, ALT 90, alk phos 50, total bilirubin 0.6, lactic acid 1.0, d-dimer 0.77, LDH 319, ferritin 179, fibrinogen 533, CRP 1.8.  I-STAT beta-hCG is negative.  Repeat SARS-CoV-2 test was ordered and pending.  Blood cultures were drawn and pending.  Portable chest x-ray showed right upper lobe consolidative airspace opacity with heterogeneous opacity bilaterally most notably in the lung bases.  Patient was given IV ceftriaxone and azithromycin and the hospitalist service was consulted for further evaluation and management.   Review of Systems: All systems reviewed and are negative except  as documented in history of present illness above.   History reviewed. No pertinent past medical history.  Past Surgical History:  Procedure Laterality Date  . FRACTURE SURGERY    . WRIST SURGERY      Social History:  reports that she has never smoked. She has never used smokeless tobacco. She reports that she does not drink alcohol or use drugs.  No Known Allergies  Family History  Problem Relation Age of Onset  . Breast cancer Mother   . Diabetes Father      Prior to Admission medications   Medication Sig Start Date End Date Taking? Authorizing Provider  albuterol (VENTOLIN HFA) 108 (90 Base) MCG/ACT inhaler Inhale 2 puffs into the lungs every 4 (four) hours as needed for shortness of breath. 11/22/18  Yes [provider]  benzonatate (TESSALON) 100 MG capsule Take 100 mg by mouth 3 (three) times daily as needed for cough. 11/22/18  Yes [provider]  ibuprofen (ADVIL) 200 MG tablet Take 400 mg by mouth every 6 (six) hours as needed for fever or headache.   Yes [provider]    Physical Exam: Vitals:   11/24/18 2045 11/24/18 2103 11/24/18 2130 11/24/18 2200  BP:  (!) 148/130 128/88 128/83  Pulse: 97 (!) 112 (!) 103 91  Resp: (!) 26 (!) 29 (!) 25 (!) 29  Temp:  98.5 F (36.9 C)    TempSrc:  Oral    SpO2: 95% 96% 94% 94%  Weight:      Height:        Constitutional: Morbidly obese woman resting in bed, appears fatigued eyes: PERRL, lids and conjunctivae  normal ENMT: Mucous membranes are moist. Posterior pharynx clear of any exudate or lesions.Normal dentition.  Neck: normal, supple, no masses. Respiratory: Breath sounds are distant and difficult to auscultate due to body habitus, slightly increased respiratory effort. No accessory muscle use.  Cardiovascular: Regular rate and rhythm, no murmurs / rubs / gallops. No extremity edema. 2+ pedal pulses. Abdomen: no tenderness, no masses palpated. No hepatosplenomegaly. Bowel sounds positive.   Musculoskeletal: no clubbing / cyanosis. No joint deformity upper and lower extremities. Good ROM, no contractures. Normal muscle tone.  Skin: Diaphoretic, no rashes, lesions, ulcers. No induration Neurologic: CN 2-12 grossly intact. Sensation intact, Strength 5/5 in all 4.  Psychiatric: Normal judgment and insight. Alert and oriented x 3. Normal mood.     Labs on Admission: I have personally reviewed following labs and imaging studies  CBC: Recent Labs  Lab 11/24/18 1941  WBC 4.5  NEUTROABS 3.0  HGB 16.2*  HCT 50.6*  MCV 94.1  PLT 256   Basic Metabolic Panel: Recent Labs  Lab 11/24/18 1941  NA 134*  K 3.7  CL 101  CO2 22  GLUCOSE 100*  BUN 8  CREATININE 0.83  CALCIUM 8.3*   GFR: Estimated Creatinine Clearance: 148.5 mL/min (by C-G formula based on SCr of 0.83 mg/dL). Liver Function Tests: Recent Labs  Lab 11/24/18 1941  AST 99*  ALT 90*  ALKPHOS 50  BILITOT 0.6  PROT 8.1  ALBUMIN 3.5   No results for input(s): LIPASE, AMYLASE in the last 168 hours. No results for input(s): AMMONIA in the last 168 hours. Coagulation Profile: No results for input(s): INR, PROTIME in the last 168 hours. Cardiac Enzymes: No results for input(s): CKTOTAL, CKMB, CKMBINDEX, TROPONINI in the last 168 hours. BNP (last 3 results) No results for input(s): PROBNP in the last 8760 hours. HbA1C: No results for input(s): HGBA1C in the last 72 hours. CBG: No results for input(s): GLUCAP in the last 168 hours. Lipid Profile: Recent Labs    11/24/18 1942  TRIG 114   Thyroid Function Tests: No results for input(s): TSH, T4TOTAL, FREET4, T3FREE, THYROIDAB in the last 72 hours. Anemia Panel: Recent Labs    11/24/18 1942  FERRITIN 179   Urine analysis:    Component Value Date/Time   COLORURINE YELLOW 09/25/2014 1430   APPEARANCEUR CLEAR 09/25/2014 1430   LABSPEC 1.022 09/25/2014 1430   PHURINE 8.0 09/25/2014 1430   GLUCOSEU NEGATIVE 09/25/2014 1430   HGBUR LARGE (A)  09/25/2014 1430   BILIRUBINUR NEGATIVE 09/25/2014 1430   KETONESUR NEGATIVE 09/25/2014 1430   PROTEINUR 30 (A) 09/25/2014 1430   UROBILINOGEN 1.0 09/25/2014 1430   NITRITE NEGATIVE 09/25/2014 1430   LEUKOCYTESUR NEGATIVE 09/25/2014 1430    Radiological Exams on Admission: Dg Chest Port 1 View  Result Date: 11/24/2018 CLINICAL DATA:  COVID-19, difficulty breathing EXAM: PORTABLE CHEST 1 VIEW COMPARISON:  05/29/2016 FINDINGS: The heart size and mediastinal contours are within normal limits. There is consolidative airspace opacity of the peripheral right upper lobe and heterogeneous opacity elsewhere bilaterally, particularly the lung bases. The visualized skeletal structures are unremarkable. IMPRESSION: There is consolidative airspace opacity of the peripheral right upper lobe and heterogeneous opacity elsewhere bilaterally, particularly the lung bases. Findings are consistent with multifocal infection and reported COVID-19 diagnosis, including the possibility of bacterial superinfection given focal consolidation in the right lung. Electronically Signed   By: Eddie Candle M.D.   On: 11/24/2018 19:29    EKG: Independently reviewed. Sinus tachycardia, rate faster when compared to  prior.  Assessment/Plan Active Problems:   Acute respiratory disease due to COVID-19 virus  MI BALLA is a 33 y.o. female with medical history significant for morbid obesity who is admitted with acute hypoxic respiratory failure due to COVID-19 viral infection.   Acute hypoxic respiratory disease due to multifocal pneumonia in setting of COVID-19 viral infection: Currently requiring 2 L supplemental O2 at rest, O2 desaturates to 90% on room air.  Chest x-ray shows bilateral opacities and questionable right upper lobe peripheral consolidation.  Patient was given IV ceftriaxone and azithromycin in the ED.  Low suspicion for bacterial pneumonia at this time with procalcitonin <0.10 supportive of primarily viral  process. -SARS-CoV-2 positive 11/22/2018 -Plan to admit to [facility] -Start IV Solu-Medrol 0.5 mg/kg q12h -Start Remdesivir per pharmacy consultation -Hold further antibiotics -Continue supplemental oxygen and wean off as able -Continue incentive spirometer, flutter valve, Combivent, vitamin C, and zinc   DVT prophylaxis: Lovenox Code Status: Full code Family Communication: Discussed with patient's husband and mother by phone Disposition Plan: Admit to [place] called: None Admission status: Inpatient, patient likely requires greater than 2 midnight length stay for management of acute hypoxic respiratory disease due to pneumonia in setting of COVID-19 viral infection as she is high risk for further decompensation in setting of severe obesity.   Zada Finders MD [facility]  If 7PM-7AM, please contact night-coverage [email or web address]  11/24/2018, 10:35 PM

## 2018-11-24 NOTE — ED Notes (Signed)
Attempted to get a second IV and second set of blood cultures.  Unable to collect. PA aware.

## 2018-11-24 NOTE — ED Triage Notes (Signed)
Pt bib EMS and coming from home. Pt was dx on Friday with COVID and states that she is having more difficulty breathing today.  EMS reports that pt was 92% RA and pt was placed on 2L [place] and O2 sat is 97%. Pt a/o x 4 and ambulatory.

## 2018-11-25 DIAGNOSIS — J1289 Other viral pneumonia: Secondary | ICD-10-CM

## 2018-11-25 DIAGNOSIS — J9601 Acute respiratory failure with hypoxia: Secondary | ICD-10-CM

## 2018-11-25 DIAGNOSIS — Z6841 Body Mass Index (BMI) 40.0 and over, adult: Secondary | ICD-10-CM

## 2018-11-25 LAB — COMPREHENSIVE METABOLIC PANEL
ALT: 90 U/L — ABNORMAL HIGH (ref 0–44)
AST: 78 U/L — ABNORMAL HIGH (ref 15–41)
Albumin: 3.3 g/dL — ABNORMAL LOW (ref 3.5–5.0)
Alkaline Phosphatase: 48 U/L (ref 38–126)
Anion gap: 10 (ref 5–15)
BUN: 9 mg/dL (ref 6–20)
CO2: 25 mmol/L (ref 22–32)
Calcium: 8.3 mg/dL — ABNORMAL LOW (ref 8.9–10.3)
Chloride: 101 mmol/L (ref 98–111)
Creatinine, Ser: 0.77 mg/dL (ref 0.44–1.00)
GFR calc Af Amer: >60 mL/min (ref >60)
GFR calc non Af Amer: >60 mL/min (ref >60)
Glucose, Bld: 181 mg/dL — ABNORMAL HIGH (ref 70–99)
Potassium: 4.2 mmol/L (ref 3.5–5.1)
Sodium: 136 mmol/L (ref 135–145)
Total Bilirubin: 0.5 mg/dL (ref 0.3–1.2)
Total Protein: 7.7 g/dL (ref 6.5–8.1)

## 2018-11-25 LAB — CBC
HCT: 48.1 % — ABNORMAL HIGH (ref 36.0–46.0)
Hemoglobin: 15.8 g/dL — ABNORMAL HIGH (ref 12.0–15.0)
MCH: 30.6 pg (ref 26.0–34.0)
MCHC: 32.8 g/dL (ref 30.0–36.0)
MCV: 93 fL (ref 80.0–100.0)
Platelets: 245 10*3/uL (ref 150–400)
RBC: 5.17 MIL/uL — ABNORMAL HIGH (ref 3.87–5.11)
RDW: 12.5 % (ref 11.5–15.5)
WBC: 4.1 10*3/uL (ref 4.0–10.5)
nRBC: 0 % (ref 0.0–0.2)

## 2018-11-25 LAB — MAGNESIUM: Magnesium: 2 mg/dL (ref 1.7–2.4)

## 2018-11-25 LAB — FERRITIN: Ferritin: 150 ng/mL (ref 11–307)

## 2018-11-25 LAB — D-DIMER, QUANTITATIVE: D-Dimer, Quant: 0.78 ug/mL-FEU — ABNORMAL HIGH (ref 0.00–0.50)

## 2018-11-25 LAB — C-REACTIVE PROTEIN: CRP: 2.3 mg/dL — ABNORMAL HIGH (ref <1.0)

## 2018-11-25 MED ORDER — ENSURE ENLIVE PO LIQD
237.0000 mL | Freq: Two times a day (BID) | ORAL | Status: DC
  Administered 2018-11-25 – 2018-11-28 (×7): 237 mL via ORAL
  Administered 2018-11-29: 14:00:00 120 mL via ORAL
  Administered 2018-11-29 – 2018-12-03 (×7): 237 mL via ORAL

## 2018-11-25 MED ORDER — ADULT MULTIVITAMIN W/MINERALS CH
1.0000 | ORAL_TABLET | Freq: Every day | ORAL | Status: DC
  Administered 2018-11-25 – 2018-12-03 (×9): 1 via ORAL
  Filled 2018-11-25 (×8): qty 1

## 2018-11-25 MED ORDER — SODIUM CHLORIDE 0.9 % IV SOLN
100.0000 mg | INTRAVENOUS | Status: AC
  Administered 2018-11-26 – 2018-11-29 (×4): 100 mg via INTRAVENOUS
  Filled 2018-11-25 (×4): qty 20

## 2018-11-25 MED ORDER — SODIUM CHLORIDE 0.9 % IV SOLN
200.0000 mg | Freq: Once | INTRAVENOUS | Status: AC
  Administered 2018-11-25: 04:00:00 200 mg via INTRAVENOUS
  Filled 2018-11-25: qty 40

## 2018-11-25 NOTE — Progress Notes (Signed)
Initial Nutrition Assessment RD working remotely.  DOCUMENTATION CODES:   Morbid obesity  INTERVENTION:   Ensure Enlive po BID, each supplement provides 350 kcal and 20 grams of protein.  MVI with minerals daily.  Pt receiving Hormel Shake daily with Breakfast which provides 520 kcals and 22 g of protein and Magic cup BID with lunch and dinner, each supplement provides 290 kcal and 9 grams of protein, automatically on meal trays to optimize nutritional intake.   NUTRITION DIAGNOSIS:   Increased nutrient needs related to acute illness(COVID) as evidenced by estimated needs.  GOAL:   Patient will meet greater than or equal to 90% of their needs  MONITOR:   PO intake, Supplement acceptance, Labs  REASON FOR ASSESSMENT:   Malnutrition Screening Tool    ASSESSMENT:   33 yo female admitted with progressive SOB r/t COVID-19, symptoms began 9/30, positive test 10/2. PMH includes morbid obesity.   On admission, patient reported recent poor intake and minimal weight loss. No recent weight encounters available for review. Last available weight from 02/15/17 is 147.9 kg, currently 148.6 kg.   Patient was just admitted to [facility] this morning; no documentation of PO intake is available yet.   Labs reviewed. Sodium 134 (L), LDH 319 (H)  Medications reviewed and include Solu-medrol, vitamin C, zinc sulfate.  NUTRITION - FOCUSED PHYSICAL EXAM:  deferred  Diet Order:   Diet Order            Diet regular Room service appropriate? Yes; Fluid consistency: Thin  Diet effective now              EDUCATION NEEDS:   Not appropriate for education at this time  Skin:  Skin Assessment: Skin Integrity Issues: Skin Integrity Issues:: Other (Comment) Other: MASD to abdomen, flank, & arm; Fissure to mid, lower, & upper back  Last BM:  10/4  Height:   Ht Readings from Last 1 Encounters:  11/25/18 5\' 7"  (1.702 m)    Weight:   Wt Readings from Last 1 Encounters:  11/25/18 (!)  148.6 kg    Ideal Body Weight:  61.4 kg  BMI:  Body mass index is 51.31 kg/m.  Estimated Nutritional Needs:   Kcal:  9833-8250  Protein:  130-150 gm  Fluid:  >/=2.2 L    Molli Barrows, RD, LDN, CNSC Pager [phone] After Hours Pager [phone]

## 2018-11-25 NOTE — Progress Notes (Signed)
Pharmacy Brief Note   O:  ALT: 90  CXR: There is consolidative airspace opacity of the peripheral right upper lobe and heterogeneous opacity elsewhere bilaterally, particularly the lung bases.  SpO2: 96% on 2 L supplemental O2 via [place].   A/P:  Patient meets requirements for remdesivir therapy.  Will start  remdesivir 200 mg IV x 1  followed by 100 mg IV daily x 4 days.  Monitor ALT  Royetta Asal, PharmD, BCPS 11/25/2018 2:44 AM

## 2018-11-25 NOTE — Progress Notes (Signed)
PROGRESS NOTE  Katrina Carlson ZHY:[id number] DOB: March 22, 1985 DOA: 11/24/2018 PCP: Candice CampLowe, David, MD   LOS: 1 day   Brief Narrative / Interim history: 33 year old female who has morbid obesity with a BMI of 7751, is being admitted to the hospital on 11/24/2018 after progressive shortness of breath, cough, fever, chills for the past 5 days.  Her symptoms of started on 11/20/2018.  She tested positive for Covid on 10/2, attempted managing her symptoms at home but given progressive shortness of breath decided to come to the ED.  She was found to be hypoxic on room air requiring supplemental oxygen, and a chest x-ray showed bilateral airspace opacities.  Subjective / 24h Interval events: Feeling little bit better today, still short of breath with activity but she is okay at rest.  She denies any abdominal pain, nausea or vomiting.  Assessment & Plan: Active Problems:   Acute respiratory disease due to COVID-19 virus   Principal Problem Acute Hypoxic Respiratory Failure due to Covid-19 Viral Illness -Wean off oxygen as tolerated, patient was started on Remdesivir on 10/4, started on steroids which are to be continued for 10 days. -She was given antibiotics in the ED but have been discontinued -Continue I-S, flutter valve, and supportive care -CRP mildly elevated   COVID-19 Labs  Recent Labs    11/24/18 1941 11/24/18 1942 11/25/18 1020  DDIMER 0.77*  --  0.78*  FERRITIN  --  179 150  LDH 319*  --   --   CRP  --  1.8* 2.3*    No results found for: SARSCOV2NAA   Active Problems Morbid obesity -Patient will benefit from significant weight loss  Scheduled Meds: . enoxaparin (LOVENOX) injection  80 mg Subcutaneous Q24H  . feeding supplement (ENSURE ENLIVE)  237 mL Oral BID BM  . Ipratropium-Albuterol  1 puff Inhalation Q6H  . methylPREDNISolone (SOLU-MEDROL) injection  0.5 mg/kg Intravenous Q12H  . multivitamin with minerals  1 tablet Oral Daily  . vitamin C  500 mg Oral Daily  .  zinc sulfate  220 mg Oral Daily   Continuous Infusions: . [START ON 11/26/2018] remdesivir 100 mg in NS 250 mL     PRN Meds:.acetaminophen, chlorpheniramine-HYDROcodone, guaiFENesin-dextromethorphan, ondansetron **OR** ondansetron (ZOFRAN) IV  DVT prophylaxis: Lovenox Code Status: Full code Family Communication: d/w patient  Disposition Plan: home when ready   Consultants:  None   Procedures:  None   Microbiology: None   Antimicrobials: None    Objective: Vitals:   11/25/18 0100 11/25/18 0158 11/25/18 0449 11/25/18 0729  BP: 128/79 119/62  131/75  Pulse: 83 94  91  Resp: (!) 28 18  16   Temp:  100 F (37.8 C) 98.4 F (36.9 C) 98.3 F (36.8 C)  TempSrc:  Oral Axillary Oral  SpO2: 93% 91%  95%  Weight:  (!) 148.6 kg    Height:  5\' 7"  (1.702 m)      Intake/Output Summary (Last 24 hours) at 11/25/2018 1333 Last data filed at 11/25/2018 0423 Gross per 24 hour  Intake 600.46 ml  Output -  Net 600.46 ml   Filed Weights   11/24/18 1911 11/25/18 0158  Weight: (!) 151.5 kg (!) 148.6 kg    Examination:  Constitutional: NAD Eyes: No scleral icterus ENMT: Mucous membranes are moist.  Respiratory: Difficult exam due to obesity, but overall clear without wheezing or crackles Cardiovascular: Difficult exam due to obesity, regular rate, no murmurs appreciated.  No peripheral edema  Abdomen: Bowel sounds positive.  Musculoskeletal: no  clubbing / cyanosis Skin: no rashes Neurologic: non focal   Data Reviewed: I have independently reviewed following labs and imaging studies   CBC: Recent Labs  Lab 11/24/18 1941 11/25/18 1020  WBC 4.5 4.1  NEUTROABS 3.0  --   HGB 16.2* 15.8*  HCT 50.6* 48.1*  MCV 94.1 93.0  PLT 259 381   Basic Metabolic Panel: Recent Labs  Lab 11/24/18 1941 11/25/18 1020  NA 134* 136  K 3.7 4.2  CL 101 101  CO2 22 25  GLUCOSE 100* 181*  BUN 8 9  CREATININE 0.83 0.77  CALCIUM 8.3* 8.3*  MG  --  2.0   GFR: Estimated Creatinine  Clearance: 152.2 mL/min (by C-G formula based on SCr of 0.77 mg/dL). Liver Function Tests: Recent Labs  Lab 11/24/18 1941 11/25/18 1020  AST 99* 78*  ALT 90* 90*  ALKPHOS 50 48  BILITOT 0.6 0.5  PROT 8.1 7.7  ALBUMIN 3.5 3.3*   No results for input(s): LIPASE, AMYLASE in the last 168 hours. No results for input(s): AMMONIA in the last 168 hours. Coagulation Profile: No results for input(s): INR, PROTIME in the last 168 hours. Cardiac Enzymes: No results for input(s): CKTOTAL, CKMB, CKMBINDEX, TROPONINI in the last 168 hours. BNP (last 3 results) No results for input(s): PROBNP in the last 8760 hours. HbA1C: No results for input(s): HGBA1C in the last 72 hours. CBG: No results for input(s): GLUCAP in the last 168 hours. Lipid Profile: Recent Labs    11/24/18 1942  TRIG 114   Thyroid Function Tests: No results for input(s): TSH, T4TOTAL, FREET4, T3FREE, THYROIDAB in the last 72 hours. Anemia Panel: Recent Labs    11/24/18 1942 11/25/18 1020  FERRITIN 179 150   Urine analysis:    Component Value Date/Time   COLORURINE YELLOW 09/25/2014 1430   APPEARANCEUR CLEAR 09/25/2014 1430   LABSPEC 1.022 09/25/2014 1430   PHURINE 8.0 09/25/2014 1430   GLUCOSEU NEGATIVE 09/25/2014 1430   HGBUR LARGE (A) 09/25/2014 1430   BILIRUBINUR NEGATIVE 09/25/2014 1430   KETONESUR NEGATIVE 09/25/2014 1430   PROTEINUR 30 (A) 09/25/2014 1430   UROBILINOGEN 1.0 09/25/2014 1430   NITRITE NEGATIVE 09/25/2014 1430   LEUKOCYTESUR NEGATIVE 09/25/2014 1430   Sepsis Labs: Invalid input(s): PROCALCITONIN, LACTICIDVEN  Recent Results (from the past 240 hour(s))  Blood Culture (routine x 2)     Status: None (Preliminary result)   Collection Time: 11/24/18  7:40 PM   Specimen: BLOOD  Result Value Ref Range Status   Specimen Description   Final    BLOOD LEFT ANTECUBITAL Performed at [facility], [address] [place], [place] [address]    Special Requests   Final    BOTTLES DRAWN  AEROBIC AND ANAEROBIC Blood Culture results may not be optimal due to an excessive volume of blood received in culture bottles Performed at [facility] [address] [place], [place] [address]    Culture   Final    NO GROWTH < 12 HOURS Performed at [facility] [address] [place], [place] [address]    Report Status PENDING  Incomplete      Radiology Studies: Dg Chest Port 1 View  Result Date: 11/24/2018 CLINICAL DATA:  COVID-19, difficulty breathing EXAM: PORTABLE CHEST 1 VIEW COMPARISON:  05/29/2016 FINDINGS: The heart size and mediastinal contours are within normal limits. There is consolidative airspace opacity of the peripheral right upper lobe and heterogeneous opacity elsewhere bilaterally, particularly the lung bases. The  visualized skeletal structures are unremarkable. IMPRESSION: There is consolidative airspace opacity of the peripheral right upper lobe and heterogeneous opacity elsewhere bilaterally, particularly the lung bases. Findings are consistent with multifocal infection and reported COVID-19 diagnosis, including the possibility of bacterial superinfection given focal consolidation in the right lung. Electronically Signed   By: Lauralyn Primes M.D.   On: 11/24/2018 19:29    Pamella Pert, MD, PhD [facility]  Contact via  [email or web address]  [facility] Office Info P: [phone] F: [phone]

## 2018-11-25 NOTE — Progress Notes (Signed)
Late Entry for 11/25/18@ 0158: Patient arrives to [facility] room 116 via stretcher from [facility] ED. No family in accompany. Vital signs-including height and weight-obtained and entered in computerized charting. No family present at bedside. Patient makes inquiry r/t contacting family. Explained per [place], no visitors are allowed at this time. Pt provides name and contact information for spouse, Katrina Carlson [phone]) and mother [name] [phone]). Explained to patient that only one family member can serve as contact; patient designates spouse, [name] as primary contact. Side rails up times 2. Bed in lowest position and locked.

## 2018-11-25 NOTE — Consult Note (Signed)
[place] Nurse wound consult note Patient is receiving care in [place].  Patient is COVID+.  Room does not have camera capability.  Images of areas of concern are not in the EMR. Consult was completed remotely after review of record.  Reason for Consult: MASD in folds, fissures on back and flank. Wound type: MASD Intertriginous Dermatitis Periwound: Moist Dressing procedure/placement/frequency:  Measure and cut length of InterDry Ag+ to fit in skin folds that have skin breakdown Tuck InterDry  Ag+ fabric into skin folds in a single layer, allow for 2 inches of overhang from skin edges to allow for wicking to occur May remove to bathe; dry area thoroughly and then tuck into affected areas again Do not apply any creams or ointments when using InterDry Ag+ DO NOT THROW AWAY FOR 5 DAYS unless soiled with stool DO NOT [place]ma product, this will inactivate the silver in the material  New sheet of Interdry Ag+ should be applied after 5 days of use if patient continues to have skin breakdown  Monitor the wound area(s) for worsening of condition such as: Signs/symptoms of infection,  Increase in size,  Development of or worsening of odor, Development of pain, or increased pain at the affected locations.  Notify the medical team if any of these develop.  Thank you for the consult. [place] nurse will not follow at this time.  Please re-consult the [facility] team if needed.  Val Riles, RN, MSN, CWOCN, CNS-BC, pager [phone]

## 2018-11-25 NOTE — Progress Notes (Signed)
Pt. just arrived as a transfer.  MD page

## 2018-11-26 ENCOUNTER — Inpatient Hospital Stay (HOSPITAL_COMMUNITY): Payer: BC Managed Care – PPO

## 2018-11-26 DIAGNOSIS — J189 Pneumonia, unspecified organism: Secondary | ICD-10-CM

## 2018-11-26 LAB — TYPE AND SCREEN
ABO/RH(D): B POS
Antibody Screen: NEGATIVE

## 2018-11-26 LAB — CBC
HCT: 48.5 % — ABNORMAL HIGH (ref 36.0–46.0)
Hemoglobin: 15.7 g/dL — ABNORMAL HIGH (ref 12.0–15.0)
MCH: 30.3 pg (ref 26.0–34.0)
MCHC: 32.4 g/dL (ref 30.0–36.0)
MCV: 93.4 fL (ref 80.0–100.0)
Platelets: 277 10*3/uL (ref 150–400)
RBC: 5.19 MIL/uL — ABNORMAL HIGH (ref 3.87–5.11)
RDW: 12.6 % (ref 11.5–15.5)
WBC: 7.2 10*3/uL (ref 4.0–10.5)
nRBC: 0 % (ref 0.0–0.2)

## 2018-11-26 LAB — COMPREHENSIVE METABOLIC PANEL
ALT: 89 U/L — ABNORMAL HIGH (ref 0–44)
AST: 68 U/L — ABNORMAL HIGH (ref 15–41)
Albumin: 3.3 g/dL — ABNORMAL LOW (ref 3.5–5.0)
Alkaline Phosphatase: 47 U/L (ref 38–126)
Anion gap: 11 (ref 5–15)
BUN: 14 mg/dL (ref 6–20)
CO2: 25 mmol/L (ref 22–32)
Calcium: 8.5 mg/dL — ABNORMAL LOW (ref 8.9–10.3)
Chloride: 103 mmol/L (ref 98–111)
Creatinine, Ser: 0.73 mg/dL (ref 0.44–1.00)
GFR calc Af Amer: >60 mL/min (ref >60)
GFR calc non Af Amer: >60 mL/min (ref >60)
Glucose, Bld: 159 mg/dL — ABNORMAL HIGH (ref 70–99)
Potassium: 4.3 mmol/L (ref 3.5–5.1)
Sodium: 139 mmol/L (ref 135–145)
Total Bilirubin: 0.4 mg/dL (ref 0.3–1.2)
Total Protein: 7.6 g/dL (ref 6.5–8.1)

## 2018-11-26 LAB — C-REACTIVE PROTEIN: CRP: 1.2 mg/dL — ABNORMAL HIGH (ref <1.0)

## 2018-11-26 LAB — D-DIMER, QUANTITATIVE: D-Dimer, Quant: 1.05 ug/mL-FEU — ABNORMAL HIGH (ref 0.00–0.50)

## 2018-11-26 LAB — ABO/RH: ABO/RH(D): B POS

## 2018-11-26 MED ORDER — FUROSEMIDE 10 MG/ML IJ SOLN
40.0000 mg | Freq: Once | INTRAMUSCULAR | Status: AC
  Administered 2018-11-26: 40 mg via INTRAVENOUS
  Filled 2018-11-26: qty 4

## 2018-11-26 MED ORDER — SODIUM CHLORIDE 0.9% IV SOLUTION
Freq: Once | INTRAVENOUS | Status: AC
  Administered 2018-11-26: 16:00:00 via INTRAVENOUS

## 2018-11-26 MED ORDER — SALINE SPRAY 0.65 % NA SOLN
1.0000 | NASAL | Status: DC | PRN
  Filled 2018-11-26: qty 44

## 2018-11-26 NOTE — Progress Notes (Signed)
Patient seen and assessed. Physical assessment completed via computerized charting per [place]. Patient dyspnea on exertion when ambulated from bathroom to bed. Side rails up times 2. Bed in lowest position and locked. Patent makes inquiry related to Covid 19 symptoms; patient reports that she is having body aches and generally feels "awful". Staff nurse confirms that these are all symptoms of Covid 19. States that her husband is currently experiencing different symptoms that she is. His symptoms include GI distrubances (diarrhea); states that he was tested last Friday and per telephone follow up his results are currently pending. Instructed patient that if spouse symptoms persist, follow up at local ED. Verbalized understanding. Offer to update family on current condition offered to patient decline at this time

## 2018-11-26 NOTE — Progress Notes (Signed)
Educated pt on prone position and benefits

## 2018-11-26 NOTE — Progress Notes (Signed)
Nursing rounds completed. Oxygen sats 88% on 5 lnc. Oxygen demand increased from 5 lnc to 8 lnc. Oxygen sat remain 88% on 8 lnc. Asked charge nurse to notify respiratory therapy r/t staff nurses concerns of increased oxygen needs

## 2018-11-26 NOTE — Progress Notes (Signed)
PROGRESS NOTE  Katrina Carlson LZJ:[id number] DOB: [birth year] DOA: 11/24/2018 PCP: Candice Camp, MD   LOS: 2 days   Brief Narrative / Interim history: 33 year old female who has morbid obesity with a BMI of 51, is being admitted to the hospital on 11/24/2018 after progressive shortness of breath, cough, fever, chills for the past 5 days.  Her symptoms of started on 11/20/2018.  She tested positive for Covid on 10/2, attempted managing her symptoms at home but given progressive shortness of breath decided to come to the ED.  She was found to be hypoxic on room air requiring supplemental oxygen, and a chest x-ray showed bilateral airspace opacities.  Subjective / 24h Interval events: Overnight requiring more oxygen, she went up to 5 L then 8 L high flow Patient states that she is feeling more short of breath this morning with minimal activity -She denies any chest pain, no abdominal pain, no nausea or vomiting  Assessment & Plan: Active Problems:   Acute respiratory disease due to COVID-19 virus   Principal Problem Acute Hypoxic Respiratory Failure due to Covid-19 Viral Illness -Initially on 2 L nasal cannula, patient was started Remdesivir on 10/4 along with steroids -10/5 evening-10/6 increased oxygen requirements to 5-8 L nasal cannula -D-dimer, CRP minimally elevated at 1.0 and 1.2 respectively -Denies any chest pain -Consented for convalescent plasma today, will transfuse -Low threshold to transfer to the ICU should her respiratory status worsen   COVID-19 Labs  Recent Labs    11/24/18 1941 11/24/18 1942 11/25/18 1020 11/26/18 0410  DDIMER 0.77*  --  0.78* 1.05*  FERRITIN  --  179 150  --   LDH 319*  --   --   --   CRP  --  1.8* 2.3* 1.2*    No results found for: SARSCOV2NAA   Active Problems Morbid obesity -Patient will benefit from significant weight loss  Scheduled Meds: . sodium chloride   Intravenous Once  . enoxaparin (LOVENOX) injection  80 mg Subcutaneous  Q24H  . feeding supplement (ENSURE ENLIVE)  237 mL Oral BID BM  . Ipratropium-Albuterol  1 puff Inhalation Q6H  . methylPREDNISolone (SOLU-MEDROL) injection  0.5 mg/kg Intravenous Q12H  . multivitamin with minerals  1 tablet Oral Daily  . vitamin C  500 mg Oral Daily  . zinc sulfate  220 mg Oral Daily   Continuous Infusions: . remdesivir 100 mg in NS 250 mL 100 mg (11/26/18 0932)   PRN Meds:.acetaminophen, chlorpheniramine-HYDROcodone, guaiFENesin-dextromethorphan, ondansetron **OR** ondansetron (ZOFRAN) IV  DVT prophylaxis: Lovenox Code Status: Full code Family Communication: d/w patient  Disposition Plan: home when ready   Consultants:  None   Procedures:  None   Microbiology: None   Antimicrobials: None    Objective: Vitals:   11/26/18 0142 11/26/18 0543 11/26/18 0816 11/26/18 0822  BP:  136/82 124/74   Pulse:  76    Resp:  13 16   Temp:  98.1 F (36.7 C) 98.1 F (36.7 C)   TempSrc:  Oral Oral   SpO2: 90% 94% (!) 86% 90%  Weight:      Height:       No intake or output data in the 24 hours ending 11/26/18 1133 Filed Weights   11/24/18 1911 11/25/18 0158  Weight: (!) 151.5 kg (!) 148.6 kg    Examination:  Constitutional: Slightly tachypneic but no significant distress noted Eyes: No scleral icterus seen ENMT: Moist mucous membranes Respiratory: Difficult exam due to obesity, diminished at the bases, no wheezing, no  crackles Cardiovascular: Difficult exam due to obesity, regular rate and rhythm, no murmurs, no edema.  No tachycardia Abdomen: Nondistended, bowel sounds positive Musculoskeletal: no clubbing / cyanosis Skin: No rash seen Neurologic: non focal   Data Reviewed: I have independently reviewed following labs and imaging studies   CBC: Recent Labs  Lab 11/24/18 1941 11/25/18 1020 11/26/18 0410  WBC 4.5 4.1 7.2  NEUTROABS 3.0  --   --   HGB 16.2* 15.8* 15.7*  HCT 50.6* 48.1* 48.5*  MCV 94.1 93.0 93.4  PLT 259 245 277   Basic  Metabolic Panel: Recent Labs  Lab 11/24/18 1941 11/25/18 1020 11/26/18 0410  NA 134* 136 139  K 3.7 4.2 4.3  CL 101 101 103  CO2 22 25 25   GLUCOSE 100* 181* 159*  BUN 8 9 14   CREATININE 0.83 0.77 0.73  CALCIUM 8.3* 8.3* 8.5*  MG  --  2.0  --    GFR: Estimated Creatinine Clearance: 152.2 mL/min (by C-G formula based on SCr of 0.73 mg/dL). Liver Function Tests: Recent Labs  Lab 11/24/18 1941 11/25/18 1020 11/26/18 0410  AST 99* 78* 68*  ALT 90* 90* 89*  ALKPHOS 50 48 47  BILITOT 0.6 0.5 0.4  PROT 8.1 7.7 7.6  ALBUMIN 3.5 3.3* 3.3*   No results for input(s): LIPASE, AMYLASE in the last 168 hours. No results for input(s): AMMONIA in the last 168 hours. Coagulation Profile: No results for input(s): INR, PROTIME in the last 168 hours. Cardiac Enzymes: No results for input(s): CKTOTAL, CKMB, CKMBINDEX, TROPONINI in the last 168 hours. BNP (last 3 results) No results for input(s): PROBNP in the last 8760 hours. HbA1C: No results for input(s): HGBA1C in the last 72 hours. CBG: No results for input(s): GLUCAP in the last 168 hours. Lipid Profile: Recent Labs    11/24/18 1942  TRIG 114   Thyroid Function Tests: No results for input(s): TSH, T4TOTAL, FREET4, T3FREE, THYROIDAB in the last 72 hours. Anemia Panel: Recent Labs    11/24/18 1942 11/25/18 1020  FERRITIN 179 150   Urine analysis:    Component Value Date/Time   COLORURINE YELLOW 09/25/2014 1430   APPEARANCEUR CLEAR 09/25/2014 1430   LABSPEC 1.022 09/25/2014 1430   PHURINE 8.0 09/25/2014 1430   GLUCOSEU NEGATIVE 09/25/2014 1430   HGBUR LARGE (A) 09/25/2014 1430   BILIRUBINUR NEGATIVE 09/25/2014 1430   KETONESUR NEGATIVE 09/25/2014 1430   PROTEINUR 30 (A) 09/25/2014 1430   UROBILINOGEN 1.0 09/25/2014 1430   NITRITE NEGATIVE 09/25/2014 1430   LEUKOCYTESUR NEGATIVE 09/25/2014 1430   Sepsis Labs: Invalid input(s): PROCALCITONIN, LACTICIDVEN  Recent Results (from the past 240 hour(s))  Blood Culture  (routine x 2)     Status: None (Preliminary result)   Collection Time: 11/24/18  7:40 PM   Specimen: BLOOD  Result Value Ref Range Status   Specimen Description   Final    BLOOD LEFT ANTECUBITAL Performed at [facility], [address] [place], [place] [address]    Special Requests   Final    BOTTLES DRAWN AEROBIC AND ANAEROBIC Blood Culture results may not be optimal due to an excessive volume of blood received in culture bottles Performed at [facility], [address] [place], [place] [address]    Culture   Final    NO GROWTH < 24 HOURS Performed at [facility], [address] [place], [place] [address]    Report Status PENDING  Incomplete  Radiology Studies: Dg Chest Port 1 View  Result Date: 11/24/2018 CLINICAL DATA:  COVID-19, difficulty breathing EXAM: PORTABLE CHEST 1 VIEW COMPARISON:  05/29/2016 FINDINGS: The heart size and mediastinal contours are within normal limits. There is consolidative airspace opacity of the peripheral right upper lobe and heterogeneous opacity elsewhere bilaterally, particularly the lung bases. The visualized skeletal structures are unremarkable. IMPRESSION: There is consolidative airspace opacity of the peripheral right upper lobe and heterogeneous opacity elsewhere bilaterally, particularly the lung bases. Findings are consistent with multifocal infection and reported COVID-19 diagnosis, including the possibility of bacterial superinfection given focal consolidation in the right lung. Electronically Signed   By: Eddie Candle M.D.   On: 11/24/2018 19:29    Marzetta Board, MD, PhD [facility]  Contact via  [email or web address]  [place] P: [phone] F: [phone]

## 2018-11-26 NOTE — TOC Initial Note (Signed)
Transition of Care [facility]) - Initial/Assessment Note    Patient Details  Name: Katrina Carlson MRN: [id number] Date of Birth: [birth year]  Transition of Care [facility]) CM/SW Contact:    Ninfa Meeker, RN Phone Number: [phone] (working remotely) 11/26/2018, 1:07 PM  Clinical Narrative:  33 yr old female admitted for COVID 19 treatment. Patient is from home. Presently on 8L HFNC, Remdesivir. Case manager will continue to monitor for needs as patient medically improves. May she be blessed to do so.                  Patient Goals and CMS Choice        Expected Discharge Plan and Services           Expected Discharge Date: 12/02/18                                    Prior Living Arrangements/Services                       Activities of Daily Living Home Assistive Devices/Equipment: None ADL Screening (condition at time of admission) Patient's cognitive ability adequate to safely complete daily activities?: Yes Is the patient deaf or have difficulty hearing?: No Does the patient have difficulty seeing, even when wearing glasses/contacts?: No Does the patient have difficulty concentrating, remembering, or making decisions?: No Patient able to express need for assistance with ADLs?: Yes Does the patient have difficulty dressing or bathing?: No Independently performs ADLs?: Yes (appropriate for developmental age) Does the patient have difficulty walking or climbing stairs?: Yes Weakness of Legs: None Weakness of Arms/Hands: None  Permission Sought/Granted                  Emotional Assessment              Admission diagnosis:  Acute respiratory failure with hypoxia ([place]) [J96.01] Pneumonia of right middle lobe due to infectious organism [J18.9] Pneumonia due to COVID-19 virus [U07.1, J12.89] Patient Active Problem List   Diagnosis Date Noted  . Acute respiratory disease due to COVID-19 virus 11/24/2018   PCP:  Louretta Shorten,  MD Pharmacy:   [id number] [place], [place] [address] [place] [address] Phone: [phone] Fax: [phone]  [facility] [place], [place] [address] Phone: [phone] Fax: [phone]     Social Determinants of Health (SDOH) Interventions    Readmission Risk Interventions No flowsheet data found.

## 2018-11-27 LAB — PREPARE FRESH FROZEN PLASMA

## 2018-11-27 LAB — FERRITIN: Ferritin: 155 ng/mL (ref 11–307)

## 2018-11-27 LAB — COMPREHENSIVE METABOLIC PANEL
ALT: 70 U/L — ABNORMAL HIGH (ref 0–44)
AST: 40 U/L (ref 15–41)
Albumin: 3.3 g/dL — ABNORMAL LOW (ref 3.5–5.0)
Alkaline Phosphatase: 44 U/L (ref 38–126)
Anion gap: 13 (ref 5–15)
BUN: 16 mg/dL (ref 6–20)
CO2: 26 mmol/L (ref 22–32)
Calcium: 8.7 mg/dL — ABNORMAL LOW (ref 8.9–10.3)
Chloride: 100 mmol/L (ref 98–111)
Creatinine, Ser: 0.75 mg/dL (ref 0.44–1.00)
GFR calc Af Amer: >60 mL/min (ref >60)
GFR calc non Af Amer: >60 mL/min (ref >60)
Glucose, Bld: 163 mg/dL — ABNORMAL HIGH (ref 70–99)
Potassium: 4.2 mmol/L (ref 3.5–5.1)
Sodium: 139 mmol/L (ref 135–145)
Total Bilirubin: 0.7 mg/dL (ref 0.3–1.2)
Total Protein: 7.4 g/dL (ref 6.5–8.1)

## 2018-11-27 LAB — BPAM FFP
Blood Product Expiration Date: 202010071430
ISSUE DATE / TIME: 202010061437
Unit Type and Rh: 7300

## 2018-11-27 LAB — D-DIMER, QUANTITATIVE: D-Dimer, Quant: 0.83 ug/mL-FEU — ABNORMAL HIGH (ref 0.00–0.50)

## 2018-11-27 LAB — CBC
HCT: 47.2 % — ABNORMAL HIGH (ref 36.0–46.0)
Hemoglobin: 15.3 g/dL — ABNORMAL HIGH (ref 12.0–15.0)
MCH: 30.6 pg (ref 26.0–34.0)
MCHC: 32.4 g/dL (ref 30.0–36.0)
MCV: 94.4 fL (ref 80.0–100.0)
Platelets: 311 10*3/uL (ref 150–400)
RBC: 5 MIL/uL (ref 3.87–5.11)
RDW: 13 % (ref 11.5–15.5)
WBC: 9.7 10*3/uL (ref 4.0–10.5)
nRBC: 0 % (ref 0.0–0.2)

## 2018-11-27 LAB — C-REACTIVE PROTEIN: CRP: <0.8 mg/dL (ref <1.0)

## 2018-11-27 MED ORDER — TOCILIZUMAB 400 MG/20ML IV SOLN
800.0000 mg | Freq: Once | INTRAVENOUS | Status: AC
  Administered 2018-11-27: 800 mg via INTRAVENOUS
  Filled 2018-11-27: qty 40

## 2018-11-27 NOTE — Progress Notes (Signed)
Educated pt on IS, flutter Valve and prone. Pt returned demonstration and is currently in prone position sats 955 12 L HFNC

## 2018-11-27 NOTE — Progress Notes (Addendum)
PROGRESS NOTE  Katrina Carlson GUR:[id number] DOB: [birth year] DOA: 11/24/2018 PCP: Louretta Shorten, MD   LOS: 3 days   Brief Narrative / Interim history:  33 year old female who has morbid obesity with a BMI of 51, is being admitted to the hospital on 11/24/2018 after progressive shortness of breath, cough, fever, chills for the past 5 days.  Her symptoms of started on 11/20/2018.  She tested positive for Covid on 10/2, attempted managing her symptoms at home but given progressive shortness of breath decided to come to the ED.  She was found to be hypoxic on room air requiring supplemental oxygen, and a chest x-ray showed bilateral airspace opacities.  Subjective / 24h Interval events: Overnight requiring more oxygen, she went up to 5 L then 8 L high flow Patient states that she is feeling more short of breath this morning with minimal activity -She denies any chest pain, no abdominal pain, no nausea or vomiting  Assessment & Plan: Active Problems:   Acute respiratory disease due to COVID-19 virus   Acute Hypoxic Respiratory Failure due to Covid-19 Viral Illness -Chest x-ray significant for bilateral opacities, she is with rapid increase in her oxygen requirement, repeat chest x-ray 10/6 with worsening opacity. -Continue with IV Remdesivir -Continue with IV steroids -Received convulsant plasma 10/6. -We will discuss Actemra with patient today -Low threshold to transfer to the ICU should her respiratory status worsen   Addendum 10/7 6:00 PM -I have discussed with the patient Actemra, explained risks and benefits, patient CRP has normalized, but this would be expected given she is on IV steroids, patient with rapid deterioration in her oxygen requirement, she is currently on 12 L nasal cannula, will proceed with Actemra as per my discussion with her.  The treatment plan and use of medications and known side effects were discussed with patient/family, they were clearly explained that there  is no proven definitive treatment for COVID-19 infection, any medications used here are based on published clinical articles/anecdotal data which are not peer-reviewed or randomized control trials.  Complete risks and long-term side effects are unknown, however in the best clinical judgment they seem to be of some clinical benefit rather than medical risks.  Patient/family agree with the treatment plan and want to receive the given medications. [place]    11/24/18 1941  11/24/18 1942 11/25/18 1020 11/26/18 0410 11/27/18 0310  DDIMER 0.77*  --   --  0.78* 1.05* 0.83*  FERRITIN  --   --  179 150  --  155  LDH 319*  --   --   --   --   --   CRP  --    < > 1.8* 2.3* 1.2* <0.8   < > = values in this interval not displayed.    No results found for: SARSCOV2NAA   Active Problems Morbid obesity -Patient will benefit from significant weight loss  Scheduled Meds: . enoxaparin (LOVENOX) injection  80 mg Subcutaneous Q24H  . feeding supplement (ENSURE ENLIVE)  237 mL Oral BID BM  . Ipratropium-Albuterol  1 puff Inhalation Q6H  . methylPREDNISolone (SOLU-MEDROL) injection  0.5 mg/kg Intravenous Q12H  . multivitamin with minerals  1 tablet Oral Daily  . vitamin C  500 mg Oral Daily  . zinc sulfate  220 mg Oral Daily   Continuous Infusions: . remdesivir 100 mg in NS 250 mL Stopped (11/27/18 1100)   PRN Meds:.acetaminophen, chlorpheniramine-HYDROcodone, guaiFENesin-dextromethorphan, ondansetron **OR** ondansetron (ZOFRAN) IV, sodium chloride  DVT prophylaxis: Lovenox  Code Status: Full code Family Communication: d/w patient  Disposition Plan: home when ready   Consultants:  None   Procedures:  None   Microbiology: None   Antimicrobials: None    Objective: Vitals:   11/26/18 1950 11/27/18 0447 11/27/18 0830 11/27/18 0938  BP:  133/85 135/80   Pulse:   97 89  Resp:   18   Temp:  97.6 F (36.4 C) 99.1 F (37.3 C)   TempSrc:  Oral Oral   SpO2: 94% 90% 91%  95%  Weight:      Height:        Intake/Output Summary (Last 24 hours) at 11/27/2018 1458 Last data filed at 11/27/2018 1100 Gross per 24 hour  Intake 813.33 ml  Output 750 ml  Net 63.33 ml   Filed Weights   11/24/18 1911 11/25/18 0158  Weight: (!) 151.5 kg (!) 148.6 kg    Examination:  Awake Alert, Oriented X 3, No new F.N deficits, Normal affect Symmetrical Chest wall movement, Good air movement bilaterally, CTAB RRR,No Gallops,Rubs or new Murmurs, No Parasternal Heave +ve B.Sounds, Abd Soft, No tenderness, No rebound - guarding or rigidity. No Cyanosis, Clubbing or edema, No new Rash or bruise    It was seen and examined with presents of her nurse Melissa  Data Reviewed: I have independently reviewed following labs and imaging studies   CBC: Recent Labs  Lab 11/24/18 1941 11/25/18 1020 11/26/18 0410 11/27/18 0310  WBC 4.5 4.1 7.2 9.7  NEUTROABS 3.0  --   --   --   HGB 16.2* 15.8* 15.7* 15.3*  HCT 50.6* 48.1* 48.5* 47.2*  MCV 94.1 93.0 93.4 94.4  PLT 259 245 277 311   Basic Metabolic Panel: Recent Labs  Lab 11/24/18 1941 11/25/18 1020 11/26/18 0410 11/27/18 0310  NA 134* 136 139 139  K 3.7 4.2 4.3 4.2  CL 101 101 103 100  CO2 22 25 25 26   GLUCOSE 100* 181* 159* 163*  BUN 8 9 14 16   CREATININE 0.83 0.77 0.73 0.75  CALCIUM 8.3* 8.3* 8.5* 8.7*  MG  --  2.0  --   --    GFR: Estimated Creatinine Clearance: 152.2 mL/min (by C-G formula based on SCr of 0.75 mg/dL). Liver Function Tests: Recent Labs  Lab 11/24/18 1941 11/25/18 1020 11/26/18 0410 11/27/18 0310  AST 99* 78* 68* 40  ALT 90* 90* 89* 70*  ALKPHOS 50 48 47 44  BILITOT 0.6 0.5 0.4 0.7  PROT 8.1 7.7 7.6 7.4  ALBUMIN 3.5 3.3* 3.3* 3.3*   No results for input(s): LIPASE, AMYLASE in the last 168 hours. No results for input(s): AMMONIA in the last 168 hours. Coagulation Profile: No results for input(s): INR, PROTIME in the last 168 hours. Cardiac Enzymes: No results for input(s): CKTOTAL,  CKMB, CKMBINDEX, TROPONINI in the last 168 hours. BNP (last 3 results) No results for input(s): PROBNP in the last 8760 hours. HbA1C: No results for input(s): HGBA1C in the last 72 hours. CBG: No results for input(s): GLUCAP in the last 168 hours. Lipid Profile: Recent Labs    11/24/18 1942  TRIG 114   Thyroid Function Tests: No results for input(s): TSH, T4TOTAL, FREET4, T3FREE, THYROIDAB in the last 72 hours. Anemia Panel: Recent Labs    11/25/18 1020 11/27/18 0310  FERRITIN 150 155   Urine analysis:    Component Value Date/Time   COLORURINE YELLOW 09/25/2014 1430   APPEARANCEUR CLEAR 09/25/2014 1430   LABSPEC 1.022 09/25/2014 1430  PHURINE 8.0 09/25/2014 1430   GLUCOSEU NEGATIVE 09/25/2014 1430   HGBUR LARGE (A) 09/25/2014 1430   BILIRUBINUR NEGATIVE 09/25/2014 1430   KETONESUR NEGATIVE 09/25/2014 1430   PROTEINUR 30 (A) 09/25/2014 1430   UROBILINOGEN 1.0 09/25/2014 1430   NITRITE NEGATIVE 09/25/2014 1430   LEUKOCYTESUR NEGATIVE 09/25/2014 1430   Sepsis Labs: Invalid input(s): PROCALCITONIN, LACTICIDVEN  Recent Results (from the past 240 hour(s))  Blood Culture (routine x 2)     Status: None (Preliminary result)   Collection Time: 11/24/18  7:40 PM   Specimen: BLOOD  Result Value Ref Range Status   Specimen Description   Final    BLOOD LEFT ANTECUBITAL Performed at [facility], [address] [place], [place] [address]    Special Requests   Final    BOTTLES DRAWN AEROBIC AND ANAEROBIC Blood Culture results may not be optimal due to an excessive volume of blood received in culture bottles Performed at [facility], [address], [place], [place] [address]    Culture   Final    NO GROWTH 3 DAYS Performed at [facility], [address], [place], [place] [address]    Report Status PENDING  Incomplete      Radiology Studies: Dg Chest Port 1 View  Result Date: 11/26/2018 CLINICAL DATA:  Hypoxia EXAM: PORTABLE CHEST 1  VIEW COMPARISON:  11/24/2018, 05/29/2016 FINDINGS: Low lung volumes. Worsening of bilateral lower lung airspace opacities. Stable cardiomediastinal silhouette. No pneumothorax. IMPRESSION: Low lung volumes with interval worsening of bilateral lower lung airspace disease/suspected pneumonia Electronically Signed   By: Jasmine Pang M.D.   On: 11/26/2018 19:42    Huey Bienenstock, MD [facility]  Contact via  [email or web address]  [facility] Office Info P: [phone] F: [phone]

## 2018-11-28 LAB — COMPREHENSIVE METABOLIC PANEL
ALT: 55 U/L — ABNORMAL HIGH (ref 0–44)
AST: 32 U/L (ref 15–41)
Albumin: 3.1 g/dL — ABNORMAL LOW (ref 3.5–5.0)
Alkaline Phosphatase: 42 U/L (ref 38–126)
Anion gap: 11 (ref 5–15)
BUN: 17 mg/dL (ref 6–20)
CO2: 28 mmol/L (ref 22–32)
Calcium: 8.2 mg/dL — ABNORMAL LOW (ref 8.9–10.3)
Chloride: 100 mmol/L (ref 98–111)
Creatinine, Ser: 0.76 mg/dL (ref 0.44–1.00)
GFR calc Af Amer: >60 mL/min (ref >60)
GFR calc non Af Amer: >60 mL/min (ref >60)
Glucose, Bld: 156 mg/dL — ABNORMAL HIGH (ref 70–99)
Potassium: 3.5 mmol/L (ref 3.5–5.1)
Sodium: 139 mmol/L (ref 135–145)
Total Bilirubin: 0.5 mg/dL (ref 0.3–1.2)
Total Protein: 7.2 g/dL (ref 6.5–8.1)

## 2018-11-28 LAB — SAMPLE TO BLOOD BANK

## 2018-11-28 LAB — BRAIN NATRIURETIC PEPTIDE: B Natriuretic Peptide: 25.1 pg/mL (ref 0.0–100.0)

## 2018-11-28 LAB — HIV ANTIBODY (ROUTINE TESTING W REFLEX): HIV Screen 4th Generation wRfx: NONREACTIVE

## 2018-11-28 LAB — D-DIMER, QUANTITATIVE: D-Dimer, Quant: 0.83 ug/mL-FEU — ABNORMAL HIGH (ref 0.00–0.50)

## 2018-11-28 MED ORDER — POTASSIUM CHLORIDE CRYS ER 20 MEQ PO TBCR
20.0000 meq | EXTENDED_RELEASE_TABLET | Freq: Once | ORAL | Status: AC
  Administered 2018-11-28: 20 meq via ORAL
  Filled 2018-11-28: qty 1

## 2018-11-28 MED ORDER — POTASSIUM CHLORIDE CRYS ER 20 MEQ PO TBCR
40.0000 meq | EXTENDED_RELEASE_TABLET | Freq: Once | ORAL | Status: AC
  Administered 2018-11-28: 40 meq via ORAL
  Filled 2018-11-28: qty 2

## 2018-11-28 MED ORDER — FUROSEMIDE 10 MG/ML IJ SOLN
60.0000 mg | Freq: Once | INTRAMUSCULAR | Status: AC
  Administered 2018-11-28: 60 mg via INTRAVENOUS
  Filled 2018-11-28: qty 6

## 2018-11-28 NOTE — Progress Notes (Signed)
PROGRESS NOTE  Katrina Carlson OXB:[id number] DOB: Feb 18, 1986 DOA: 11/24/2018 PCP: Candice Camp, MD   LOS: 4 days   Brief Narrative / Interim history:  33 year old female who has morbid obesity with a BMI of 51, is being admitted to the hospital on 11/24/2018 after progressive shortness of breath, cough, fever, chills for the past 5 days.  Her symptoms of started on 11/20/2018.  She tested positive for Covid on 10/2, attempted managing her symptoms at home but given progressive shortness of breath decided to come to the ED.  She was found to be hypoxic on room air requiring supplemental oxygen, and a chest x-ray showed bilateral airspace opacities.  Subjective / 24h Interval events:  She denies any complaints overnight, reports dyspnea has improved, still reports cough.  Assessment & Plan: Active Problems:   Acute respiratory disease due to COVID-19 virus   Acute Hypoxic Respiratory Failure due to Covid-19 Viral Illness -Chest x-ray significant for bilateral opacities, she is with rapid increase in her oxygen requirement, she was on 12 L nasal cannula yesterday, as well had worsening chest x-ray on 10/6, she did receive IV Actemra, and convulsant plasma over the last 48 hours. repeat chest x-ray 10/6 with worsening opacity,  -Continue with IV Remdesivir -Continue with IV steroids -Received convulsant plasma 10/6. -Received IV Actemra 10/7 -Low threshold to transfer to the ICU should her respiratory status worsen, as she is high risk for decompensation. -Improvement of oxygen requirement today, I was able to wean her to 7 L this morning -will give 1 dose of 60 mg IV Lasix today   The treatment plan and use of medications and known side effects were discussed with patient/family, they were clearly explained that there is no proven definitive treatment for COVID-19 infection, any medications used here are based on published clinical articles/anecdotal data which are not peer-reviewed or  randomized control trials.  Complete risks and long-term side effects are unknown, however in the best clinical judgment they seem to be of some clinical benefit rather than medical risks.  Patient/family agree with the treatment plan and want to receive the given medications. COVID-19 Labs  Recent Labs    11/26/18 0410 11/27/18 0310 11/28/18 0940  DDIMER 1.05* 0.83* 0.83*  FERRITIN  --  155  --   CRP 1.2* <0.8  --     No results found for: SARSCOV2NAA   Active Problems Morbid obesity -Patient will benefit from significant weight loss  Scheduled Meds: . enoxaparin (LOVENOX) injection  80 mg Subcutaneous Q24H  . feeding supplement (ENSURE ENLIVE)  237 mL Oral BID BM  . Ipratropium-Albuterol  1 puff Inhalation Q6H  . methylPREDNISolone (SOLU-MEDROL) injection  0.5 mg/kg Intravenous Q12H  . multivitamin with minerals  1 tablet Oral Daily  . vitamin C  500 mg Oral Daily  . zinc sulfate  220 mg Oral Daily   Continuous Infusions: . remdesivir 100 mg in NS 250 mL 100 mg (11/28/18 0922)   PRN Meds:.acetaminophen, chlorpheniramine-HYDROcodone, guaiFENesin-dextromethorphan, ondansetron **OR** ondansetron (ZOFRAN) IV, sodium chloride  DVT prophylaxis: Lovenox Code Status: Full code Family Communication: d/w husband via phone 10/7 Disposition Plan: home when ready   Consultants:  None   Procedures:  None   Microbiology: None   Antimicrobials: None    Objective: Vitals:   11/27/18 1934 11/28/18 0000 11/28/18 0414 11/28/18 0819  BP: (!) 153/90  124/63 135/76  Pulse: 91 79 77 80  Resp: 18   18  Temp: 99.1 F (37.3 C)  98.4 F (  36.9 C) 98.5 F (36.9 C)  TempSrc: Oral  Oral Oral  SpO2: 92% 93% 94% 92%  Weight:      Height:        Intake/Output Summary (Last 24 hours) at 11/28/2018 1620 Last data filed at 11/28/2018 1230 Gross per 24 hour  Intake 600 ml  Output -  Net 600 ml   Filed Weights   11/24/18 1911 11/25/18 0158  Weight: (!) 151.5 kg (!) 148.6 kg     Examination:  Awake Alert, Oriented X 3, No new F.N deficits, Normal affect Symmetrical Chest wall movement, Good air movement bilaterally, CTAB RRR,No Gallops,Rubs or new Murmurs, No Parasternal Heave +ve B.Sounds, Abd Soft, No tenderness, No rebound - guarding or rigidity. No Cyanosis, Clubbing or edema, No new Rash or bruise     Data Reviewed: I have independently reviewed following labs and imaging studies   CBC: Recent Labs  Lab 11/24/18 1941 11/25/18 1020 11/26/18 0410 11/27/18 0310  WBC 4.5 4.1 7.2 9.7  NEUTROABS 3.0  --   --   --   HGB 16.2* 15.8* 15.7* 15.3*  HCT 50.6* 48.1* 48.5* 47.2*  MCV 94.1 93.0 93.4 94.4  PLT 259 245 277 989   Basic Metabolic Panel: Recent Labs  Lab 11/24/18 1941 11/25/18 1020 11/26/18 0410 11/27/18 0310 11/28/18 0940  NA 134* 136 139 139 139  K 3.7 4.2 4.3 4.2 3.5  CL 101 101 103 100 100  CO2 22 25 25 26 28   GLUCOSE 100* 181* 159* 163* 156*  BUN 8 9 14 16 17   CREATININE 0.83 0.77 0.73 0.75 0.76  CALCIUM 8.3* 8.3* 8.5* 8.7* 8.2*  MG  --  2.0  --   --   --    GFR: Estimated Creatinine Clearance: 152.2 mL/min (by C-G formula based on SCr of 0.76 mg/dL). Liver Function Tests: Recent Labs  Lab 11/24/18 1941 11/25/18 1020 11/26/18 0410 11/27/18 0310 11/28/18 0940  AST 99* 78* 68* 40 32  ALT 90* 90* 89* 70* 55*  ALKPHOS 50 48 47 44 42  BILITOT 0.6 0.5 0.4 0.7 0.5  PROT 8.1 7.7 7.6 7.4 7.2  ALBUMIN 3.5 3.3* 3.3* 3.3* 3.1*   No results for input(s): LIPASE, AMYLASE in the last 168 hours. No results for input(s): AMMONIA in the last 168 hours. Coagulation Profile: No results for input(s): INR, PROTIME in the last 168 hours. Cardiac Enzymes: No results for input(s): CKTOTAL, CKMB, CKMBINDEX, TROPONINI in the last 168 hours. BNP (last 3 results) No results for input(s): PROBNP in the last 8760 hours. HbA1C: No results for input(s): HGBA1C in the last 72 hours. CBG: No results for input(s): GLUCAP in the last 168 hours.  Lipid Profile: No results for input(s): CHOL, HDL, LDLCALC, TRIG, CHOLHDL, LDLDIRECT in the last 72 hours. Thyroid Function Tests: No results for input(s): TSH, T4TOTAL, FREET4, T3FREE, THYROIDAB in the last 72 hours. Anemia Panel: Recent Labs    11/27/18 0310  FERRITIN 155   Urine analysis:    Component Value Date/Time   COLORURINE YELLOW 09/25/2014 1430   APPEARANCEUR CLEAR 09/25/2014 1430   LABSPEC 1.022 09/25/2014 1430   PHURINE 8.0 09/25/2014 1430   GLUCOSEU NEGATIVE 09/25/2014 1430   HGBUR LARGE (A) 09/25/2014 1430   BILIRUBINUR NEGATIVE 09/25/2014 1430   KETONESUR NEGATIVE 09/25/2014 1430   PROTEINUR 30 (A) 09/25/2014 1430   UROBILINOGEN 1.0 09/25/2014 1430   NITRITE NEGATIVE 09/25/2014 1430   LEUKOCYTESUR NEGATIVE 09/25/2014 1430   Sepsis Labs: Invalid input(s): PROCALCITONIN, LACTICIDVEN  Recent Results (from the past 240 hour(s))  Blood Culture (routine x 2)     Status: None (Preliminary result)   Collection Time: 11/24/18  7:40 PM   Specimen: BLOOD  Result Value Ref Range Status   Specimen Description   Final    BLOOD LEFT ANTECUBITAL Performed at [facility], [address] [place], [place] [address]    Special Requests   Final    BOTTLES DRAWN AEROBIC AND ANAEROBIC Blood Culture results may not be optimal due to an excessive volume of blood received in culture bottles Performed at [facility], [address] [place], [place] [address]    Culture   Final    NO GROWTH 4 DAYS Performed at [facility], [address] [place], [place] [address]    Report Status PENDING  Incomplete      Radiology Studies: Dg Chest Port 1 View  Result Date: 11/26/2018 CLINICAL DATA:  Hypoxia EXAM: PORTABLE CHEST 1 VIEW COMPARISON:  11/24/2018, 05/29/2016 FINDINGS: Low lung volumes. Worsening of bilateral lower lung airspace opacities. Stable cardiomediastinal silhouette. No pneumothorax. IMPRESSION: Low lung volumes with interval  worsening of bilateral lower lung airspace disease/suspected pneumonia Electronically Signed   By: Jasmine PangKim  Fujinaga M.D.   On: 11/26/2018 19:42    Huey Bienenstockdawood Willies Laviolette, MD [facility]  Contact via  [email or web address]  [facility] Office Info P: [phone] F: [phone]

## 2018-11-28 NOTE — Progress Notes (Signed)
Patient deferred family update phone call. 

## 2018-11-29 LAB — COMPREHENSIVE METABOLIC PANEL
ALT: 56 U/L — ABNORMAL HIGH (ref 0–44)
AST: 28 U/L (ref 15–41)
Albumin: 3.4 g/dL — ABNORMAL LOW (ref 3.5–5.0)
Alkaline Phosphatase: 45 U/L (ref 38–126)
Anion gap: 13 (ref 5–15)
BUN: 20 mg/dL (ref 6–20)
CO2: 27 mmol/L (ref 22–32)
Calcium: 8.6 mg/dL — ABNORMAL LOW (ref 8.9–10.3)
Chloride: 97 mmol/L — ABNORMAL LOW (ref 98–111)
Creatinine, Ser: 0.66 mg/dL (ref 0.44–1.00)
GFR calc Af Amer: >60 mL/min (ref >60)
GFR calc non Af Amer: >60 mL/min (ref >60)
Glucose, Bld: 201 mg/dL — ABNORMAL HIGH (ref 70–99)
Potassium: 4.3 mmol/L (ref 3.5–5.1)
Sodium: 137 mmol/L (ref 135–145)
Total Bilirubin: 0.7 mg/dL (ref 0.3–1.2)
Total Protein: 7.6 g/dL (ref 6.5–8.1)

## 2018-11-29 LAB — CULTURE, BLOOD (ROUTINE X 2): Culture: NO GROWTH

## 2018-11-29 LAB — CBC
HCT: 49.6 % — ABNORMAL HIGH (ref 36.0–46.0)
Hemoglobin: 16 g/dL — ABNORMAL HIGH (ref 12.0–15.0)
MCH: 30.2 pg (ref 26.0–34.0)
MCHC: 32.3 g/dL (ref 30.0–36.0)
MCV: 93.6 fL (ref 80.0–100.0)
Platelets: 352 10*3/uL (ref 150–400)
RBC: 5.3 MIL/uL — ABNORMAL HIGH (ref 3.87–5.11)
RDW: 12.7 % (ref 11.5–15.5)
WBC: 8.1 10*3/uL (ref 4.0–10.5)
nRBC: 0 % (ref 0.0–0.2)

## 2018-11-29 LAB — D-DIMER, QUANTITATIVE: D-Dimer, Quant: 0.7 ug/mL-FEU — ABNORMAL HIGH (ref 0.00–0.50)

## 2018-11-29 MED ORDER — FUROSEMIDE 10 MG/ML IJ SOLN
40.0000 mg | Freq: Once | INTRAMUSCULAR | Status: AC
  Administered 2018-11-29: 40 mg via INTRAVENOUS
  Filled 2018-11-29: qty 4

## 2018-11-29 NOTE — Progress Notes (Signed)
Patient deferred family update phone call. 

## 2018-11-29 NOTE — Progress Notes (Signed)
PROGRESS NOTE  Katrina Carlson CVE:[id number] DOB: 09/04/1985 DOA: 11/24/2018 PCP: Candice Camp, MD   LOS: 5 days   Brief Narrative / Interim history:  33 year old female who has morbid obesity with a BMI of 51, is being admitted to the hospital on 11/24/2018 after progressive shortness of breath, cough, fever, chills for the past 5 days.  Her symptoms of started on 11/20/2018.  She tested positive for Covid on 10/2, attempted managing her symptoms at home but given progressive shortness of breath decided to come to the ED.  She was found to be hypoxic on room air requiring supplemental oxygen, and a chest x-ray showed bilateral airspace opacities.  Subjective / 24h Interval events:  Patient reports dyspnea at baseline, still having some cough, no chest pain, fever or chills  Assessment & Plan: Active Problems:   Acute respiratory disease due to COVID-19 virus   Acute Hypoxic Respiratory Failure due to Covid-19 Viral Illness -Chest x-ray significant for bilateral opacities, she is with rapid increase in her oxygen requirement, she was on 12 L nasal cannula , this is improving, he was on 7 L yesterday, was able to wean to 4 L nasal cannula today , I have encouraged her to use incentive spirometry, flutter valve, and to prone once in bed, and out of bed to chair . -Continue with IV Remdesivir -Continue with IV steroids -Received convulsant plasma 10/6. -Received IV Actemra 10/7 -Low threshold to transfer to the ICU should her respiratory status worsen, as she is high risk for decompensation. -Did receive 60 mg of IV Lasix yesterday, will give another 40 mg of IV Lasix today, especially with stable renal function.   The treatment plan and use of medications and known side effects were discussed with patient/family, they were clearly explained that there is no proven definitive treatment for COVID-19 infection, any medications used here are based on published clinical articles/anecdotal data  which are not peer-reviewed or randomized control trials.  Complete risks and long-term side effects are unknown, however in the best clinical judgment they seem to be of some clinical benefit rather than medical risks.  Patient/family agree with the treatment plan and want to receive the given medications. COVID-19 Labs  Recent Labs    11/27/18 0310 11/28/18 0940 11/29/18 0520  DDIMER 0.83* 0.83* 0.70*  FERRITIN 155  --   --   CRP <0.8  --   --     No results found for: SARSCOV2NAA   Active Problems Morbid obesity -Patient will benefit from significant weight loss  Scheduled Meds:  enoxaparin (LOVENOX) injection  80 mg Subcutaneous Q24H   feeding supplement (ENSURE ENLIVE)  237 mL Oral BID BM   Ipratropium-Albuterol  1 puff Inhalation Q6H   methylPREDNISolone (SOLU-MEDROL) injection  0.5 mg/kg Intravenous Q12H   multivitamin with minerals  1 tablet Oral Daily   vitamin C  500 mg Oral Daily   zinc sulfate  220 mg Oral Daily   Continuous Infusions:  PRN Meds:.acetaminophen, chlorpheniramine-HYDROcodone, guaiFENesin-dextromethorphan, ondansetron **OR** ondansetron (ZOFRAN) IV, sodium chloride  DVT prophylaxis: Lovenox Code Status: Full code Family Communication: d/w patient Disposition Plan: home when ready   Consultants:  None   Procedures:  None   Microbiology: None   Antimicrobials: None    Objective: Vitals:   11/28/18 2100 11/29/18 0535 11/29/18 0844 11/29/18 0941  BP: 134/85 122/60 123/78   Pulse: 89 82 81   Resp: 20 18 17    Temp: 98.6 F (37 C) 98.3 F (36.8 C) 97.8  F (36.6 C)   TempSrc: Oral Oral Oral   SpO2: 91% 91% 92% 90%  Weight:      Height:        Intake/Output Summary (Last 24 hours) at 11/29/2018 1223 Last data filed at 11/29/2018 1012 Gross per 24 hour  Intake 2133.61 ml  Output --  Net 2133.61 ml   Filed Weights   11/24/18 1911 11/25/18 0158  Weight: (!) 151.5 kg (!) 148.6 kg    Examination:  Awake Alert, Oriented X  3, No new F.N deficits, Normal affect, sitting in a chair Symmetrical Chest wall movement, Good air movement bilaterally, CTAB RRR,No Gallops,Rubs or new Murmurs, No Parasternal Heave +ve B.Sounds, Abd Soft, No tenderness, No rebound - guarding or rigidity. No Cyanosis, Clubbing or edema, No new Rash or bruise      Data Reviewed: I have independently reviewed following labs and imaging studies   CBC: Recent Labs  Lab 11/24/18 1941 11/25/18 1020 11/26/18 0410 11/27/18 0310 11/29/18 0520  WBC 4.5 4.1 7.2 9.7 8.1  NEUTROABS 3.0  --   --   --   --   HGB 16.2* 15.8* 15.7* 15.3* 16.0*  HCT 50.6* 48.1* 48.5* 47.2* 49.6*  MCV 94.1 93.0 93.4 94.4 93.6  PLT 259 245 277 311 352   Basic Metabolic Panel: Recent Labs  Lab 11/25/18 1020 11/26/18 0410 11/27/18 0310 11/28/18 0940 11/29/18 0520  NA 136 139 139 139 137  K 4.2 4.3 4.2 3.5 4.3  CL 101 103 100 100 97*  CO2 25 25 26 28 27   GLUCOSE 181* 159* 163* 156* 201*  BUN 9 14 16 17 20   CREATININE 0.77 0.73 0.75 0.76 0.66  CALCIUM 8.3* 8.5* 8.7* 8.2* 8.6*  MG 2.0  --   --   --   --    GFR: Estimated Creatinine Clearance: 152.2 mL/min (by C-G formula based on SCr of 0.66 mg/dL). Liver Function Tests: Recent Labs  Lab 11/25/18 1020 11/26/18 0410 11/27/18 0310 11/28/18 0940 11/29/18 0520  AST 78* 68* 40 32 28  ALT 90* 89* 70* 55* 56*  ALKPHOS 48 47 44 42 45  BILITOT 0.5 0.4 0.7 0.5 0.7  PROT 7.7 7.6 7.4 7.2 7.6  ALBUMIN 3.3* 3.3* 3.3* 3.1* 3.4*   No results for input(s): LIPASE, AMYLASE in the last 168 hours. No results for input(s): AMMONIA in the last 168 hours. Coagulation Profile: No results for input(s): INR, PROTIME in the last 168 hours. Cardiac Enzymes: No results for input(s): CKTOTAL, CKMB, CKMBINDEX, TROPONINI in the last 168 hours. BNP (last 3 results) No results for input(s): PROBNP in the last 8760 hours. HbA1C: No results for input(s): HGBA1C in the last 72 hours. CBG: No results for input(s): GLUCAP  in the last 168 hours. Lipid Profile: No results for input(s): CHOL, HDL, LDLCALC, TRIG, CHOLHDL, LDLDIRECT in the last 72 hours. Thyroid Function Tests: No results for input(s): TSH, T4TOTAL, FREET4, T3FREE, THYROIDAB in the last 72 hours. Anemia Panel: Recent Labs    11/27/18 0310  FERRITIN 155   Urine analysis:    Component Value Date/Time   COLORURINE YELLOW 09/25/2014 1430   APPEARANCEUR CLEAR 09/25/2014 1430   LABSPEC 1.022 09/25/2014 1430   PHURINE 8.0 09/25/2014 1430   GLUCOSEU NEGATIVE 09/25/2014 1430   HGBUR LARGE (A) 09/25/2014 1430   BILIRUBINUR NEGATIVE 09/25/2014 1430   KETONESUR NEGATIVE 09/25/2014 1430   PROTEINUR 30 (A) 09/25/2014 1430   UROBILINOGEN 1.0 09/25/2014 1430   NITRITE NEGATIVE 09/25/2014 1430  LEUKOCYTESUR NEGATIVE 09/25/2014 1430   Sepsis Labs: Invalid input(s): PROCALCITONIN, LACTICIDVEN  Recent Results (from the past 240 hour(s))  Blood Culture (routine x 2)     Status: None   Collection Time: 11/24/18  7:40 PM   Specimen: BLOOD  Result Value Ref Range Status   Specimen Description   Final    BLOOD LEFT ANTECUBITAL Performed at [facility], [address] [place], [place] [address]    Special Requests   Final    BOTTLES DRAWN AEROBIC AND ANAEROBIC Blood Culture results may not be optimal due to an excessive volume of blood received in culture bottles Performed at [place] [address], [place], [place] [address]    Culture   Final    NO GROWTH 5 DAYS Performed at [facility], [place] [address] [place], [place] [address]    Report Status 11/29/2018 FINAL  Final      Radiology Studies: No results found.  Phillips Climes, MD [facility]  Contact via  [email or web address]  [place] P: [phone] F: [phone]

## 2018-11-30 LAB — D-DIMER, QUANTITATIVE: D-Dimer, Quant: 0.66 ug/mL-FEU — ABNORMAL HIGH (ref 0.00–0.50)

## 2018-11-30 LAB — COMPREHENSIVE METABOLIC PANEL
ALT: 53 U/L — ABNORMAL HIGH (ref 0–44)
AST: 25 U/L (ref 15–41)
Albumin: 3.7 g/dL (ref 3.5–5.0)
Alkaline Phosphatase: 50 U/L (ref 38–126)
Anion gap: 11 (ref 5–15)
BUN: 23 mg/dL — ABNORMAL HIGH (ref 6–20)
CO2: 29 mmol/L (ref 22–32)
Calcium: 8.9 mg/dL (ref 8.9–10.3)
Chloride: 97 mmol/L — ABNORMAL LOW (ref 98–111)
Creatinine, Ser: 0.89 mg/dL (ref 0.44–1.00)
GFR calc Af Amer: >60 mL/min (ref >60)
GFR calc non Af Amer: >60 mL/min (ref >60)
Glucose, Bld: 182 mg/dL — ABNORMAL HIGH (ref 70–99)
Potassium: 4.6 mmol/L (ref 3.5–5.1)
Sodium: 137 mmol/L (ref 135–145)
Total Bilirubin: 0.8 mg/dL (ref 0.3–1.2)
Total Protein: 8.3 g/dL — ABNORMAL HIGH (ref 6.5–8.1)

## 2018-11-30 LAB — CBC
HCT: 52.8 % — ABNORMAL HIGH (ref 36.0–46.0)
Hemoglobin: 16.9 g/dL — ABNORMAL HIGH (ref 12.0–15.0)
MCH: 30.2 pg (ref 26.0–34.0)
MCHC: 32 g/dL (ref 30.0–36.0)
MCV: 94.5 fL (ref 80.0–100.0)
Platelets: 408 10*3/uL — ABNORMAL HIGH (ref 150–400)
RBC: 5.59 MIL/uL — ABNORMAL HIGH (ref 3.87–5.11)
RDW: 12.6 % (ref 11.5–15.5)
WBC: 11.8 10*3/uL — ABNORMAL HIGH (ref 4.0–10.5)
nRBC: 0 % (ref 0.0–0.2)

## 2018-11-30 MED ORDER — PANTOPRAZOLE SODIUM 40 MG PO TBEC
40.0000 mg | DELAYED_RELEASE_TABLET | Freq: Two times a day (BID) | ORAL | Status: DC
  Administered 2018-11-30 – 2018-12-03 (×7): 40 mg via ORAL
  Filled 2018-11-30 (×7): qty 1

## 2018-11-30 NOTE — Progress Notes (Addendum)
Attempted to call patient's husband, no answer. Patient is alert and oriented and has personal cell phone at bedside. Will call and update spouse later if needed by patient.   17:14 patient's O2 saturations ambulating on 2L 91%

## 2018-11-30 NOTE — Progress Notes (Signed)
PROGRESS NOTE  Katrina Carlson JJO:[id number] DOB: [birth year] DOA: 11/24/2018 PCP: Candice Camp, MD   LOS: 6 days   Brief Narrative / Interim history:  33 year old female who has morbid obesity with a BMI of 51, is being admitted to the hospital on 11/24/2018 after progressive shortness of breath, cough, fever, chills for the past 5 days.  Her symptoms of started on 11/20/2018.  She tested positive for Covid on 10/2, attempted managing her symptoms at home but given progressive shortness of breath decided to come to the ED.  She was found to be hypoxic on room air requiring supplemental oxygen, and a chest x-ray showed bilateral airspace opacities.  Subjective / 24h Interval events:  Patient reports dyspnea at baseline, still having some cough, no chest pain, fever or chills  Assessment & Plan: Active Problems:   Acute respiratory disease due to COVID-19 virus   Acute Hypoxic Respiratory Failure due to Covid-19 Viral Illness -Chest x-ray significant for bilateral opacities, she is with rapid increase in her oxygen requirement, she was on 12 L nasal cannula , this is improving, she is being weaned gradually, this morning she is on 2 L nasal cannula, she is compliant with incentive spirometry, flutter valve, out of bed to chair and pruning. -Received  with IV Remdesivir -Continue with IV steroids -Received convulsant plasma 10/6. -Received IV Actemra 10/7 -Patient has been receiving Lasix as needed, will hold on Lasix today as she appears to be on the dry side.    The treatment plan and use of medications and known side effects were discussed with patient/family, they were clearly explained that there is no proven definitive treatment for COVID-19 infection, any medications used here are based on published clinical articles/anecdotal data which are not peer-reviewed or randomized control trials.  Complete risks and long-term side effects are unknown, however in the best clinical judgment  they seem to be of some clinical benefit rather than medical risks.  Patient/family agree with the treatment plan and want to receive the given medications. COVID-19 Labs  Recent Labs    11/28/18 0940 11/29/18 0520 11/30/18 0049  DDIMER 0.83* 0.70* 0.66*    No results found for: SARSCOV2NAA   Active Problems Morbid obesity -Patient will benefit from significant weight loss  Scheduled Meds:  enoxaparin (LOVENOX) injection  80 mg Subcutaneous Q24H   feeding supplement (ENSURE ENLIVE)  237 mL Oral BID BM   Ipratropium-Albuterol  1 puff Inhalation Q6H   methylPREDNISolone (SOLU-MEDROL) injection  0.5 mg/kg Intravenous Q12H   multivitamin with minerals  1 tablet Oral Daily   pantoprazole  40 mg Oral BID   vitamin C  500 mg Oral Daily   zinc sulfate  220 mg Oral Daily   Continuous Infusions:  PRN Meds:.acetaminophen, chlorpheniramine-HYDROcodone, guaiFENesin-dextromethorphan, ondansetron **OR** ondansetron (ZOFRAN) IV, sodium chloride  DVT prophylaxis: Lovenox Code Status: Full code Family Communication: d/w patient, discussed with husband via phone 10/9 Disposition Plan: home when ready   Consultants:  None   Procedures:  None   Microbiology: None   Antimicrobials: None    Objective: Vitals:   11/30/18 0000 11/30/18 0045 11/30/18 0430 11/30/18 0744  BP:   132/73 133/69  Pulse:  62 75 85  Resp:   17 18  Temp:   (!) 97.5 F (36.4 C) 98.3 F (36.8 C)  TempSrc:   Oral Oral  SpO2: 92% 94% 91% 92%  Weight:      Height:       No intake or output data  in the 24 hours ending 11/30/18 1407 Filed Weights   11/24/18 1911 11/25/18 0158  Weight: (!) 151.5 kg (!) 148.6 kg    Examination:  Awake Alert, Oriented X 3, No new F.N deficits, Normal affect Symmetrical Chest wall movement, Good air movement bilaterally, CTAB RRR,No Gallops,Rubs or new Murmurs, No Parasternal Heave +ve B.Sounds, Abd Soft, No tenderness, No rebound - guarding or rigidity. No  Cyanosis, Clubbing or edema, No new Rash or bruise      Data Reviewed: I have independently reviewed following labs and imaging studies   CBC: Recent Labs  Lab 11/24/18 1941 11/25/18 1020 11/26/18 0410 11/27/18 0310 11/29/18 0520 11/30/18 0049  WBC 4.5 4.1 7.2 9.7 8.1 11.8*  NEUTROABS 3.0  --   --   --   --   --   HGB 16.2* 15.8* 15.7* 15.3* 16.0* 16.9*  HCT 50.6* 48.1* 48.5* 47.2* 49.6* 52.8*  MCV 94.1 93.0 93.4 94.4 93.6 94.5  PLT 259 245 277 311 352 003*   Basic Metabolic Panel: Recent Labs  Lab 11/25/18 1020 11/26/18 0410 11/27/18 0310 11/28/18 0940 11/29/18 0520 11/30/18 0049  NA 136 139 139 139 137 137  K 4.2 4.3 4.2 3.5 4.3 4.6  CL 101 103 100 100 97* 97*  CO2 25 25 26 28 27 29   GLUCOSE 181* 159* 163* 156* 201* 182*  BUN 9 14 16 17 20  23*  CREATININE 0.77 0.73 0.75 0.76 0.66 0.89  CALCIUM 8.3* 8.5* 8.7* 8.2* 8.6* 8.9  MG 2.0  --   --   --   --   --    GFR: Estimated Creatinine Clearance: 136.8 mL/min (by C-G formula based on SCr of 0.89 mg/dL). Liver Function Tests: Recent Labs  Lab 11/26/18 0410 11/27/18 0310 11/28/18 0940 11/29/18 0520 11/30/18 0049  AST 68* 40 32 28 25  ALT 89* 70* 55* 56* 53*  ALKPHOS 47 44 42 45 50  BILITOT 0.4 0.7 0.5 0.7 0.8  PROT 7.6 7.4 7.2 7.6 8.3*  ALBUMIN 3.3* 3.3* 3.1* 3.4* 3.7   No results for input(s): LIPASE, AMYLASE in the last 168 hours. No results for input(s): AMMONIA in the last 168 hours. Coagulation Profile: No results for input(s): INR, PROTIME in the last 168 hours. Cardiac Enzymes: No results for input(s): CKTOTAL, CKMB, CKMBINDEX, TROPONINI in the last 168 hours. BNP (last 3 results) No results for input(s): PROBNP in the last 8760 hours. HbA1C: No results for input(s): HGBA1C in the last 72 hours. CBG: No results for input(s): GLUCAP in the last 168 hours. Lipid Profile: No results for input(s): CHOL, HDL, LDLCALC, TRIG, CHOLHDL, LDLDIRECT in the last 72 hours. Thyroid Function Tests: No  results for input(s): TSH, T4TOTAL, FREET4, T3FREE, THYROIDAB in the last 72 hours. Anemia Panel: No results for input(s): VITAMINB12, FOLATE, FERRITIN, TIBC, IRON, RETICCTPCT in the last 72 hours. Urine analysis:    Component Value Date/Time   COLORURINE YELLOW 09/25/2014 1430   APPEARANCEUR CLEAR 09/25/2014 1430   LABSPEC 1.022 09/25/2014 1430   PHURINE 8.0 09/25/2014 1430   GLUCOSEU NEGATIVE 09/25/2014 1430   HGBUR LARGE (A) 09/25/2014 1430   BILIRUBINUR NEGATIVE 09/25/2014 1430   KETONESUR NEGATIVE 09/25/2014 1430   PROTEINUR 30 (A) 09/25/2014 1430   UROBILINOGEN 1.0 09/25/2014 1430   NITRITE NEGATIVE 09/25/2014 1430   LEUKOCYTESUR NEGATIVE 09/25/2014 1430   Sepsis Labs: Invalid input(s): PROCALCITONIN, LACTICIDVEN  Recent Results (from the past 240 hour(s))  Blood Culture (routine x 2)     Status: None  Collection Time: 11/24/18  7:40 PM   Specimen: BLOOD  Result Value Ref Range Status   Specimen Description   Final    BLOOD LEFT ANTECUBITAL Performed at [facility], [address] [place], [place] [address]    Special Requests   Final    BOTTLES DRAWN AEROBIC AND ANAEROBIC Blood Culture results may not be optimal due to an excessive volume of blood received in culture bottles Performed at [facility], [address] [place], [place] [address]    Culture   Final    NO GROWTH 5 DAYS Performed at [facility], [address] [place], [place] [address]    Report Status 11/29/2018 FINAL  Final      Radiology Studies: No results found.  Huey Bienenstockdawood Shravya Wickwire, MD [facility]  Contact via  [email or web address]  [facility] Office Info P: [phone] F: [phone]

## 2018-11-30 NOTE — TOC Progression Note (Signed)
Transition of Care [facility]) - Progression Note    Patient Details  Name: Katrina Carlson MRN: [id number] Date of Birth: 07-19-1985  Transition of Care [facility]) CM/SW Contact  Loletha Grayer Beverely Pace, RN Phone Number: 11/30/2018, 4:03 PM  Clinical Narrative:   33 year old female admitted  after progressive shortness of breath, cough, fever, chills.She tested positive for Covid on 10/2. On 2L [facility] now. Case manager will continue to monitor for needs.          Expected Discharge Plan and Services           Expected Discharge Date: 12/02/18                                     Social Determinants of Health (SDOH) Interventions    Readmission Risk Interventions No flowsheet data found.

## 2018-12-01 LAB — COMPREHENSIVE METABOLIC PANEL
ALT: 35 U/L (ref 0–44)
AST: 14 U/L — ABNORMAL LOW (ref 15–41)
Albumin: 3.4 g/dL — ABNORMAL LOW (ref 3.5–5.0)
Alkaline Phosphatase: 43 U/L (ref 38–126)
Anion gap: 11 (ref 5–15)
BUN: 19 mg/dL (ref 6–20)
CO2: 26 mmol/L (ref 22–32)
Calcium: 8.5 mg/dL — ABNORMAL LOW (ref 8.9–10.3)
Chloride: 100 mmol/L (ref 98–111)
Creatinine, Ser: 0.68 mg/dL (ref 0.44–1.00)
GFR calc Af Amer: >60 mL/min (ref >60)
GFR calc non Af Amer: >60 mL/min (ref >60)
Glucose, Bld: 145 mg/dL — ABNORMAL HIGH (ref 70–99)
Potassium: 4 mmol/L (ref 3.5–5.1)
Sodium: 137 mmol/L (ref 135–145)
Total Bilirubin: 1 mg/dL (ref 0.3–1.2)
Total Protein: 7.3 g/dL (ref 6.5–8.1)

## 2018-12-01 LAB — CBC
HCT: 49.1 % — ABNORMAL HIGH (ref 36.0–46.0)
Hemoglobin: 15.9 g/dL — ABNORMAL HIGH (ref 12.0–15.0)
MCH: 30.1 pg (ref 26.0–34.0)
MCHC: 32.4 g/dL (ref 30.0–36.0)
MCV: 92.8 fL (ref 80.0–100.0)
Platelets: 418 10*3/uL — ABNORMAL HIGH (ref 150–400)
RBC: 5.29 MIL/uL — ABNORMAL HIGH (ref 3.87–5.11)
RDW: 12.5 % (ref 11.5–15.5)
WBC: 14.4 10*3/uL — ABNORMAL HIGH (ref 4.0–10.5)
nRBC: 0 % (ref 0.0–0.2)

## 2018-12-01 LAB — D-DIMER, QUANTITATIVE: D-Dimer, Quant: 0.7 ug/mL-FEU — ABNORMAL HIGH (ref 0.00–0.50)

## 2018-12-01 NOTE — Progress Notes (Signed)
Patient declined for RN to make update call to family.

## 2018-12-01 NOTE — Plan of Care (Addendum)
Patient completed 3 laps around unit at beginning of shift with mild dyspnea and maintained sats >90.Resting well overnight.  Problem: Education: Goal: Knowledge of General Education information will improve Description: Including pain rating scale, medication(s)/side effects and non-pharmacologic comfort measures Outcome: Progressing   Problem: Health Behavior/Discharge Planning: Goal: Ability to manage health-related needs will improve Outcome: Progressing   Problem: Clinical Measurements: Goal: Ability to maintain clinical measurements within normal limits will improve Outcome: Progressing Goal: Will remain free from infection Outcome: Progressing Goal: Respiratory complications will improve Outcome: Progressing   Problem: Activity: Goal: Risk for activity intolerance will decrease Outcome: Progressing   Problem: Nutrition: Goal: Adequate nutrition will be maintained Outcome: Progressing   Problem: Coping: Goal: Level of anxiety will decrease Outcome: Progressing   Problem: Safety: Goal: Ability to remain free from injury will improve Outcome: Progressing   Problem: Skin Integrity: Goal: Risk for impaired skin integrity will decrease Outcome: Progressing   Problem: Education: Goal: Knowledge of risk factors and measures for prevention of condition will improve Outcome: Progressing   Problem: Coping: Goal: Psychosocial and spiritual needs will be supported Outcome: Progressing   Problem: Respiratory: Goal: Will maintain a patent airway Outcome: Progressing Goal: Complications related to the disease process, condition or treatment will be avoided or minimized Outcome: Progressing

## 2018-12-01 NOTE — Progress Notes (Signed)
PROGRESS NOTE  Katrina Carlson JSH:[id number] DOB: [birth year] DOA: 11/24/2018 PCP: Louretta Shorten, MD   LOS: 7 days   Brief Narrative / Interim history:  33 year old female who has morbid obesity with a BMI of 51, is being admitted to the hospital on 11/24/2018 after progressive shortness of breath, cough, fever, chills for the past 5 days.  Her symptoms of started on 11/20/2018.  She tested positive for Covid on 10/2, attempted managing her symptoms at home but given progressive shortness of breath decided to come to the ED.  She was found to be hypoxic on room air requiring supplemental oxygen, and a chest x-ray showed bilateral airspace opacities.  Subjective / 24h Interval events:  Patient reports dyspnea has improved, was able to walk in the hallway yesterday with oxygen at 2 L .  Assessment & Plan: Active Problems:   Acute respiratory disease due to COVID-19 virus   Acute Hypoxic Respiratory Failure due to Covid-19 Viral Illness -Chest x-ray significant for bilateral opacities, initially with rapid increase in her oxygen requirement, she was on 12 L nasal cannula , this is improving, she is being weaned gradually, he remains on 2 L nasal cannula this morning, she was able to ambulate yesterday on 2 L nasal cannula as well, she was encouraged again today to prone once in bed, use incentive spirometry and out of bed to chair.   -Received  with IV Remdesivir -Continue with IV steroids -Received convulsant plasma 10/6. -Received IV Actemra 10/7 -Patient has been receiving Lasix as needed, appears to be on the dry side, will hold on Lasix today.   The treatment plan and use of medications and known side effects were discussed with patient/family, they were clearly explained that there is no proven definitive treatment for COVID-19 infection, any medications used here are based on published clinical articles/anecdotal data which are not peer-reviewed or randomized control trials.  Complete  risks and long-term side effects are unknown, however in the best clinical judgment they seem to be of some clinical benefit rather than medical risks.  Patient/family agree with the treatment plan and want to receive the given medications. COVID-19 Labs  Recent Labs    11/29/18 0520 11/30/18 0049 12/01/18 0855  DDIMER 0.70* 0.66* 0.70*    No results found for: SARSCOV2NAA   Active Problems Morbid obesity -Patient will benefit from significant weight loss  Scheduled Meds: . enoxaparin (LOVENOX) injection  80 mg Subcutaneous Q24H  . feeding supplement (ENSURE ENLIVE)  237 mL Oral BID BM  . Ipratropium-Albuterol  1 puff Inhalation Q6H  . methylPREDNISolone (SOLU-MEDROL) injection  0.5 mg/kg Intravenous Q12H  . multivitamin with minerals  1 tablet Oral Daily  . pantoprazole  40 mg Oral BID  . vitamin C  500 mg Oral Daily  . zinc sulfate  220 mg Oral Daily   Continuous Infusions:  PRN Meds:.acetaminophen, chlorpheniramine-HYDROcodone, guaiFENesin-dextromethorphan, ondansetron **OR** ondansetron (ZOFRAN) IV, sodium chloride  DVT prophylaxis: Lovenox Code Status: Full code Family Communication: d/w patient, discussed with husband via phone 10/9 Disposition Plan: home when ready   Consultants:  None   Procedures:  None   Microbiology: None   Antimicrobials: None    Objective: Vitals:   11/30/18 1557 11/30/18 1953 12/01/18 0517 12/01/18 0825  BP: (!) 145/81 130/87 120/69 116/67  Pulse: 70 75 63 65  Resp: 19 19 20 17   Temp: 99 F (37.2 C) 98.7 F (37.1 C) 98.7 F (37.1 C) 98.4 F (36.9 C)  TempSrc: Oral Oral Oral Oral  SpO2: 91% 93% 91% (!) 89%  Weight:      Height:        Intake/Output Summary (Last 24 hours) at 12/01/2018 1436 Last data filed at 12/01/2018 0100 Gross per 24 hour  Intake 480 ml  Output 350 ml  Net 130 ml   Filed Weights   11/24/18 1911 11/25/18 0158  Weight: (!) 151.5 kg (!) 148.6 kg    Examination:  Awake Alert, Oriented X 3, No  new F.N deficits, Normal affect Symmetrical Chest wall movement, Good air movement bilaterally, CTAB RRR,No Gallops,Rubs or new Murmurs, No Parasternal Heave +ve B.Sounds, Abd Soft, No tenderness, No rebound - guarding or rigidity. No Cyanosis, Clubbing or edema, No new Rash or bruise       Data Reviewed: I have independently reviewed following labs and imaging studies   CBC: Recent Labs  Lab 11/24/18 1941  11/26/18 0410 11/27/18 0310 11/29/18 0520 11/30/18 0049 12/01/18 0855  WBC 4.5   < > 7.2 9.7 8.1 11.8* 14.4*  NEUTROABS 3.0  --   --   --   --   --   --   HGB 16.2*   < > 15.7* 15.3* 16.0* 16.9* 15.9*  HCT 50.6*   < > 48.5* 47.2* 49.6* 52.8* 49.1*  MCV 94.1   < > 93.4 94.4 93.6 94.5 92.8  PLT 259   < > 277 311 352 408* 418*   < > = values in this interval not displayed.   Basic Metabolic Panel: Recent Labs  Lab 11/25/18 1020  11/27/18 0310 11/28/18 0940 11/29/18 0520 11/30/18 0049 12/01/18 0855  NA 136   < > 139 139 137 137 137  K 4.2   < > 4.2 3.5 4.3 4.6 4.0  CL 101   < > 100 100 97* 97* 100  CO2 25   < > 26 28 27 29 26   GLUCOSE 181*   < > 163* 156* 201* 182* 145*  BUN 9   < > 16 17 20  23* 19  CREATININE 0.77   < > 0.75 0.76 0.66 0.89 0.68  CALCIUM 8.3*   < > 8.7* 8.2* 8.6* 8.9 8.5*  MG 2.0  --   --   --   --   --   --    < > = values in this interval not displayed.   GFR: Estimated Creatinine Clearance: 152.2 mL/min (by C-G formula based on SCr of 0.68 mg/dL). Liver Function Tests: Recent Labs  Lab 11/27/18 0310 11/28/18 0940 11/29/18 0520 11/30/18 0049 12/01/18 0855  AST 40 32 28 25 14*  ALT 70* 55* 56* 53* 35  ALKPHOS 44 42 45 50 43  BILITOT 0.7 0.5 0.7 0.8 1.0  PROT 7.4 7.2 7.6 8.3* 7.3  ALBUMIN 3.3* 3.1* 3.4* 3.7 3.4*   No results for input(s): LIPASE, AMYLASE in the last 168 hours. No results for input(s): AMMONIA in the last 168 hours. Coagulation Profile: No results for input(s): INR, PROTIME in the last 168 hours. Cardiac Enzymes: No  results for input(s): CKTOTAL, CKMB, CKMBINDEX, TROPONINI in the last 168 hours. BNP (last 3 results) No results for input(s): PROBNP in the last 8760 hours. HbA1C: No results for input(s): HGBA1C in the last 72 hours. CBG: No results for input(s): GLUCAP in the last 168 hours. Lipid Profile: No results for input(s): CHOL, HDL, LDLCALC, TRIG, CHOLHDL, LDLDIRECT in the last 72 hours. Thyroid Function Tests: No results for input(s): TSH, T4TOTAL, FREET4, T3FREE, THYROIDAB in the last  72 hours. Anemia Panel: No results for input(s): VITAMINB12, FOLATE, FERRITIN, TIBC, IRON, RETICCTPCT in the last 72 hours. Urine analysis:    Component Value Date/Time   COLORURINE YELLOW 09/25/2014 1430   APPEARANCEUR CLEAR 09/25/2014 1430   LABSPEC 1.022 09/25/2014 1430   PHURINE 8.0 09/25/2014 1430   GLUCOSEU NEGATIVE 09/25/2014 1430   HGBUR LARGE (A) 09/25/2014 1430   BILIRUBINUR NEGATIVE 09/25/2014 1430   KETONESUR NEGATIVE 09/25/2014 1430   PROTEINUR 30 (A) 09/25/2014 1430   UROBILINOGEN 1.0 09/25/2014 1430   NITRITE NEGATIVE 09/25/2014 1430   LEUKOCYTESUR NEGATIVE 09/25/2014 1430   Sepsis Labs: Invalid input(s): PROCALCITONIN, LACTICIDVEN  Recent Results (from the past 240 hour(s))  Blood Culture (routine x 2)     Status: None   Collection Time: 11/24/18  7:40 PM   Specimen: BLOOD  Result Value Ref Range Status   Specimen Description   Final    BLOOD LEFT ANTECUBITAL Performed at [facility], [address] [place], [place] [address]    Special Requests   Final    BOTTLES DRAWN AEROBIC AND ANAEROBIC Blood Culture results may not be optimal due to an excessive volume of blood received in culture bottles Performed at [facility], [address] [place], [place] [address]    Culture   Final    NO GROWTH 5 DAYS Performed at [facility], [address], [place], [place] [address]    Report Status 11/29/2018 FINAL  Final      Radiology Studies: No  results found.  Huey Bienenstock, MD [facility]  Contact via  [email or web address]  [facility] Office Info P: [phone] F: [phone]

## 2018-12-01 NOTE — Plan of Care (Signed)
Pt slept well during the night. No complaints of pain verbalized. Alert and oriented. Vitals stable on 2L O2 via nasal cannula. Still with dyspnea on exertion and productive cough noted, prn Robitussin given. Minimal assist with ADLs. Stand by assist x1 to the [facility] for toileting needs. Skin assessed, MASD on skin folds noted, interdry maintained.  IV on SL. Able to reposition independently in bed. Tolerated prone positioning for >4hrs overnight. No other issues, will monitor.   Problem: Education: Goal: Knowledge of General Education information will improve Description: Including pain rating scale, medication(s)/side effects and non-pharmacologic comfort measures Outcome: Progressing   Problem: Health Behavior/Discharge Planning: Goal: Ability to manage health-related needs will improve Outcome: Progressing   Problem: Clinical Measurements: Goal: Ability to maintain clinical measurements within normal limits will improve Outcome: Progressing Goal: Will remain free from infection Outcome: Progressing Goal: Respiratory complications will improve Outcome: Progressing   Problem: Activity: Goal: Risk for activity intolerance will decrease Outcome: Progressing   Problem: Nutrition: Goal: Adequate nutrition will be maintained Outcome: Progressing   Problem: Coping: Goal: Level of anxiety will decrease Outcome: Progressing   Problem: Safety: Goal: Ability to remain free from injury will improve Outcome: Progressing   Problem: Skin Integrity: Goal: Risk for impaired skin integrity will decrease Outcome: Progressing   Problem: Education: Goal: Knowledge of risk factors and measures for prevention of condition will improve Outcome: Progressing   Problem: Coping: Goal: Psychosocial and spiritual needs will be supported Outcome: Progressing   Problem: Respiratory: Goal: Will maintain a patent airway Outcome: Progressing Goal: Complications related to the disease process,  condition or treatment will be avoided or minimized Outcome: Progressing

## 2018-12-02 LAB — CBC
HCT: 48.8 % — ABNORMAL HIGH (ref 36.0–46.0)
Hemoglobin: 15.7 g/dL — ABNORMAL HIGH (ref 12.0–15.0)
MCH: 30.1 pg (ref 26.0–34.0)
MCHC: 32.2 g/dL (ref 30.0–36.0)
MCV: 93.5 fL (ref 80.0–100.0)
Platelets: 412 10*3/uL — ABNORMAL HIGH (ref 150–400)
RBC: 5.22 MIL/uL — ABNORMAL HIGH (ref 3.87–5.11)
RDW: 12.6 % (ref 11.5–15.5)
WBC: 11.6 10*3/uL — ABNORMAL HIGH (ref 4.0–10.5)
nRBC: 0 % (ref 0.0–0.2)

## 2018-12-02 LAB — COMPREHENSIVE METABOLIC PANEL
ALT: 34 U/L (ref 0–44)
AST: 16 U/L (ref 15–41)
Albumin: 3.1 g/dL — ABNORMAL LOW (ref 3.5–5.0)
Alkaline Phosphatase: 41 U/L (ref 38–126)
Anion gap: 10 (ref 5–15)
BUN: 19 mg/dL (ref 6–20)
CO2: 25 mmol/L (ref 22–32)
Calcium: 8.4 mg/dL — ABNORMAL LOW (ref 8.9–10.3)
Chloride: 100 mmol/L (ref 98–111)
Creatinine, Ser: 0.66 mg/dL (ref 0.44–1.00)
GFR calc Af Amer: >60 mL/min (ref >60)
GFR calc non Af Amer: >60 mL/min (ref >60)
Glucose, Bld: 199 mg/dL — ABNORMAL HIGH (ref 70–99)
Potassium: 4.9 mmol/L (ref 3.5–5.1)
Sodium: 135 mmol/L (ref 135–145)
Total Bilirubin: 0.9 mg/dL (ref 0.3–1.2)
Total Protein: 7.1 g/dL (ref 6.5–8.1)

## 2018-12-02 LAB — D-DIMER, QUANTITATIVE: D-Dimer, Quant: 0.68 ug/mL-FEU — ABNORMAL HIGH (ref 0.00–0.50)

## 2018-12-02 MED ORDER — FUROSEMIDE 10 MG/ML IJ SOLN
40.0000 mg | Freq: Once | INTRAMUSCULAR | Status: AC
  Administered 2018-12-02: 40 mg via INTRAVENOUS
  Filled 2018-12-02: qty 4

## 2018-12-02 NOTE — Progress Notes (Signed)
SATURATION QUALIFICATIONS: (This note is used to comply with regulatory documentation for home oxygen)  Patient Saturations on Room Air at Rest = 92%  Patient Saturations on Room Air while Ambulating = 85%  Patient Saturations on 2 Liters of oxygen while Ambulating = 92%  Please briefly explain why patient needs home oxygen: 

## 2018-12-02 NOTE — Progress Notes (Signed)
SATURATION QUALIFICATIONS: (This note is used to comply with regulatory documentation for home oxygen)  Patient Saturations on Room Air at Rest = 91%  Patient Saturations on Room Air while Ambulating = 88%  Patient Saturations on 0Liters of oxygen while Ambulating = 88-90%  Please briefly explain why patient needs home oxygen:

## 2018-12-02 NOTE — Plan of Care (Signed)
  Problem: Education: Goal: Knowledge of General Education information will improve Description: Including pain rating scale, medication(s)/side effects and non-pharmacologic comfort measures Outcome: Progressing   Problem: Health Behavior/Discharge Planning: Goal: Ability to manage health-related needs will improve Outcome: Progressing   Problem: Clinical Measurements: Goal: Ability to maintain clinical measurements within normal limits will improve Outcome: Progressing Goal: Will remain free from infection Outcome: Progressing Goal: Respiratory complications will improve Outcome: Progressing   Problem: Activity: Goal: Risk for activity intolerance will decrease Outcome: Progressing   Problem: Nutrition: Goal: Adequate nutrition will be maintained Outcome: Progressing   Problem: Coping: Goal: Level of anxiety will decrease Outcome: Progressing   Problem: Safety: Goal: Ability to remain free from injury will improve Outcome: Progressing   Problem: Skin Integrity: Goal: Risk for impaired skin integrity will decrease Outcome: Progressing   Problem: Education: Goal: Knowledge of risk factors and measures for prevention of condition will improve Outcome: Progressing   Problem: Coping: Goal: Psychosocial and spiritual needs will be supported Outcome: Progressing   Problem: Respiratory: Goal: Will maintain a patent airway Outcome: Progressing Goal: Complications related to the disease process, condition or treatment will be avoided or minimized Outcome: Progressing

## 2018-12-02 NOTE — Progress Notes (Signed)
Nutrition Follow-up RD working remotely.  DOCUMENTATION CODES:   Morbid obesity  INTERVENTION:   Continue Ensure Enlive po BID, each supplement provides 350 kcal and 20 grams of protein.  Continue MVI with minerals daily.  Continue Hormel Shake daily with Breakfast which provides 520 kcals and 22 g of protein and Magic cup BID with lunch and dinner, each supplement provides 290 kcal and 9 grams of protein, automatically on meal trays to optimize nutritional intake.   NUTRITION DIAGNOSIS:   Increased nutrient needs related to acute illness(COVID) as evidenced by estimated needs.  GOAL:   Patient will meet greater than or equal to 90% of their needs  MONITOR:   PO intake, Supplement acceptance, Labs  ASSESSMENT:   32 yo female admitted with progressive SOB r/t COVID-19, symptoms began 9/30, positive test 10/2. PMH includes morbid obesity.   Patient reports good intake of meals, consuming "almost all" of her meals. She hasn't had many Ensure Enlive supplements, but has had a few. Encouraged intake of meals and supplements.   Labs reviewed.  Medications reviewed and include Solu-medrol, MVI, vitamin C, zinc sulfate.   Diet Order:   Diet Order            Diet regular Room service appropriate? Yes; Fluid consistency: Thin  Diet effective now              EDUCATION NEEDS:   Not appropriate for education at this time  Skin:  Skin Assessment: Skin Integrity Issues: Skin Integrity Issues:: Other (Comment) Other: MASD to abdomen  Last BM:  10/10  Height:   Ht Readings from Last 1 Encounters:  11/25/18 5\' 7"  (1.702 m)    Weight:   Wt Readings from Last 1 Encounters:  11/25/18 (!) 148.6 kg    Ideal Body Weight:  61.4 kg  BMI:  Body mass index is 51.31 kg/m.  Estimated Nutritional Needs:   Kcal:  4098-1191  Protein:  130-150 gm  Fluid:  >/=2.2 L    Molli Barrows, RD, LDN, CNSC Pager [phone] After Hours Pager [phone]

## 2018-12-02 NOTE — TOC Initial Note (Signed)
Transition of Care [facility]) - Initial/Assessment Note    Patient Details  Name: Katrina Carlson MRN: [id number] Date of Birth: [birth year]  [facility] CM/SW Contact:    Carles Collet, RN Phone Number: 12/02/2018, 4:39 PM  Clinical Narrative:                 Spoke w patient to set up home oxygen. Patient lives at home w spouse, and he will be contact for O2 delivery. Info provided to [place].  [employer] notified of new O2 set up and will deliver to house tomorrow 10/13. Will also restock [employer] supply of transport tanks.  AC notified (currently out of tanks) will bring one to room w pulse ox tomorrow.     Expected Discharge Plan: Home/Self Care Barriers to Discharge: Continued Medical Work up   Patient Goals and CMS Choice        Expected Discharge Plan and Services Expected Discharge Plan: Home/Self Care         Expected Discharge Date: 12/02/18                                    Prior Living Arrangements/Services   Lives with:: Spouse                   Activities of Daily Living Home Assistive Devices/Equipment: None ADL Screening (condition at time of admission) Patient's cognitive ability adequate to safely complete daily activities?: Yes Is the patient deaf or have difficulty hearing?: No Does the patient have difficulty seeing, even when wearing glasses/contacts?: No Does the patient have difficulty concentrating, remembering, or making decisions?: No Patient able to express need for assistance with ADLs?: Yes Does the patient have difficulty dressing or bathing?: No Independently performs ADLs?: Yes (appropriate for developmental age) Does the patient have difficulty walking or climbing stairs?: Yes Weakness of Legs: None Weakness of Arms/Hands: None  Permission Sought/Granted                  Emotional Assessment              Admission diagnosis:  Acute respiratory failure with hypoxia ([place]) [J96.01] Pneumonia of right  middle lobe due to infectious organism [J18.9] Pneumonia due to COVID-19 virus [U07.1, J12.89] Patient Active Problem List   Diagnosis Date Noted  . Acute respiratory disease due to COVID-19 virus 11/24/2018   PCP:  Louretta Shorten, MD Pharmacy:   [id number] [place], [place] [address] [place] [address] Phone: [phone] Fax: [phone]  [facility] [place], [place] [address] Phone: [phone] Fax: [phone]     Social Determinants of Health (SDOH) Interventions    Readmission Risk Interventions No flowsheet data found.

## 2018-12-02 NOTE — Progress Notes (Signed)
PROGRESS NOTE  Katrina Carlson ZOX:[id number] DOB: 1985-10-10 DOA: 11/24/2018 PCP: Katrina CampLowe, David, MD   LOS: 8 days   Brief Narrative / Interim history:  33 year old female who has morbid obesity with a BMI of 51, is being admitted to the hospital on 11/24/2018 after progressive shortness of breath, cough, fever, chills for the past 5 days.  Her symptoms of started on 11/20/2018.  She tested positive for Covid on 10/2, attempted managing her symptoms at home but given progressive shortness of breath decided to come to the ED.  She was found to be hypoxic on room air requiring supplemental oxygen, and a chest x-ray showed bilateral airspace opacities.  Subjective / 24h Interval events:  Patient reports dyspnea has improved, liters in the hallway today, she still requiring 2 L nasal cannula on exertion .  Assessment & Plan: Active Problems:   Acute respiratory disease due to COVID-19 virus   Acute Hypoxic Respiratory Failure due to Covid-19 Viral Illness -Chest x-ray significant for bilateral opacities, initially with rapid increase in her oxygen requirement, she was on 12 L nasal cannula , this is improving, morning she is on 1 L at rest, on ambulation she did drop 85% saturation, she did require 2 L nasal cannula . -She has been compliant with pronating, incentive spirometry, and out of bed to chair.   -Received  with IV Remdesivir -Continue with IV steroids -Received convulsant plasma 10/6. -Received IV Actemra 10/7 -Patient has been receiving Lasix as needed, give 40 mg of IV Lasix today.   The treatment plan and use of medications and known side effects were discussed with patient/family, they were clearly explained that there is no proven definitive treatment for COVID-19 infection, any medications used here are based on published clinical articles/anecdotal data which are not peer-reviewed or randomized control trials.  Complete risks and long-term side effects are unknown, however in  the best clinical judgment they seem to be of some clinical benefit rather than medical risks.  Patient/family agree with the treatment plan and want to receive the given medications. COVID-19 Labs  Recent Labs    11/30/18 0049 12/01/18 0855 12/02/18 0240  DDIMER 0.66* 0.70* 0.68*    No results found for: SARSCOV2NAA   Active Problems Morbid obesity -Patient will benefit from significant weight loss  Scheduled Meds:  enoxaparin (LOVENOX) injection  80 mg Subcutaneous Q24H   feeding supplement (ENSURE ENLIVE)  237 mL Oral BID BM   furosemide  40 mg Intravenous Once   Ipratropium-Albuterol  1 puff Inhalation Q6H   methylPREDNISolone (SOLU-MEDROL) injection  0.5 mg/kg Intravenous Q12H   multivitamin with minerals  1 tablet Oral Daily   pantoprazole  40 mg Oral BID   vitamin C  500 mg Oral Daily   zinc sulfate  220 mg Oral Daily   Continuous Infusions:  PRN Meds:.acetaminophen, chlorpheniramine-HYDROcodone, guaiFENesin-dextromethorphan, ondansetron **OR** ondansetron (ZOFRAN) IV, sodium chloride  DVT prophylaxis: Lovenox Code Status: Full code Family Communication: d/w patient, discussed with husband via phone 10/9 Disposition Plan: home when ready   Consultants:  None   Procedures:  None   Microbiology: None   Antimicrobials: None    Objective: Vitals:   12/02/18 0000 12/02/18 0415 12/02/18 0751 12/02/18 0752  BP:  126/76 127/77 127/77  Pulse:  93  60  Resp:    18  Temp:  98.4 F (36.9 C) 98.5 F (36.9 C)   TempSrc:  Oral Oral   SpO2: 94% 93%  93%  Weight:  Height:        Intake/Output Summary (Last 24 hours) at 12/02/2018 1123 Last data filed at 12/02/2018 0500 Gross per 24 hour  Intake 400 ml  Output --  Net 400 ml   Filed Weights   11/24/18 1911 11/25/18 0158  Weight: (!) 151.5 kg (!) 148.6 kg    Examination:  Awake Alert, Oriented X 3, No new F.N deficits, Normal affect Symmetrical Chest wall movement, Good air movement  bilaterally, CTAB RRR,No Gallops,Rubs or new Murmurs, No Parasternal Heave +ve B.Sounds, Abd Soft, No tenderness, No rebound - guarding or rigidity. No Cyanosis, Clubbing or edema, No new Rash or bruise       Data Reviewed: I have independently reviewed following labs and imaging studies   CBC: Recent Labs  Lab 11/27/18 0310 11/29/18 0520 11/30/18 0049 12/01/18 0855 12/02/18 0240  WBC 9.7 8.1 11.8* 14.4* 11.6*  HGB 15.3* 16.0* 16.9* 15.9* 15.7*  HCT 47.2* 49.6* 52.8* 49.1* 48.8*  MCV 94.4 93.6 94.5 92.8 93.5  PLT 311 352 408* 418* 017*   Basic Metabolic Panel: Recent Labs  Lab 11/28/18 0940 11/29/18 0520 11/30/18 0049 12/01/18 0855 12/02/18 0240  NA 139 137 137 137 135  K 3.5 4.3 4.6 4.0 4.9  CL 100 97* 97* 100 100  CO2 28 27 29 26 25   GLUCOSE 156* 201* 182* 145* 199*  BUN 17 20 23* 19 19  CREATININE 0.76 0.66 0.89 0.68 0.66  CALCIUM 8.2* 8.6* 8.9 8.5* 8.4*   GFR: Estimated Creatinine Clearance: 152.2 mL/min (by C-G formula based on SCr of 0.66 mg/dL). Liver Function Tests: Recent Labs  Lab 11/28/18 0940 11/29/18 0520 11/30/18 0049 12/01/18 0855 12/02/18 0240  AST 32 28 25 14* 16  ALT 55* 56* 53* 35 34  ALKPHOS 42 45 50 43 41  BILITOT 0.5 0.7 0.8 1.0 0.9  PROT 7.2 7.6 8.3* 7.3 7.1  ALBUMIN 3.1* 3.4* 3.7 3.4* 3.1*   No results for input(s): LIPASE, AMYLASE in the last 168 hours. No results for input(s): AMMONIA in the last 168 hours. Coagulation Profile: No results for input(s): INR, PROTIME in the last 168 hours. Cardiac Enzymes: No results for input(s): CKTOTAL, CKMB, CKMBINDEX, TROPONINI in the last 168 hours. BNP (last 3 results) No results for input(s): PROBNP in the last 8760 hours. HbA1C: No results for input(s): HGBA1C in the last 72 hours. CBG: No results for input(s): GLUCAP in the last 168 hours. Lipid Profile: No results for input(s): CHOL, HDL, LDLCALC, TRIG, CHOLHDL, LDLDIRECT in the last 72 hours. Thyroid Function Tests: No  results for input(s): TSH, T4TOTAL, FREET4, T3FREE, THYROIDAB in the last 72 hours. Anemia Panel: No results for input(s): VITAMINB12, FOLATE, FERRITIN, TIBC, IRON, RETICCTPCT in the last 72 hours. Urine analysis:    Component Value Date/Time   COLORURINE YELLOW 09/25/2014 1430   APPEARANCEUR CLEAR 09/25/2014 1430   LABSPEC 1.022 09/25/2014 1430   PHURINE 8.0 09/25/2014 1430   GLUCOSEU NEGATIVE 09/25/2014 1430   HGBUR LARGE (A) 09/25/2014 1430   BILIRUBINUR NEGATIVE 09/25/2014 1430   KETONESUR NEGATIVE 09/25/2014 1430   PROTEINUR 30 (A) 09/25/2014 1430   UROBILINOGEN 1.0 09/25/2014 1430   NITRITE NEGATIVE 09/25/2014 1430   LEUKOCYTESUR NEGATIVE 09/25/2014 1430   Sepsis Labs: Invalid input(s): PROCALCITONIN, LACTICIDVEN  Recent Results (from the past 240 hour(s))  Blood Culture (routine x 2)     Status: None   Collection Time: 11/24/18  7:40 PM   Specimen: BLOOD  Result Value Ref Range Status  Specimen Description   Final    BLOOD LEFT ANTECUBITAL Performed at [facility], [address] [place], [place] [address]    Special Requests   Final    BOTTLES DRAWN AEROBIC AND ANAEROBIC Blood Culture results may not be optimal due to an excessive volume of blood received in culture bottles Performed at [facility], [address] [place], [place] [address]    Culture   Final    NO GROWTH 5 DAYS Performed at [facility], [address], [place], [place] [address]    Report Status 11/29/2018 FINAL  Final      Radiology Studies: No results found.  Huey Bienenstock, MD [facility]  Contact via  [email or web address]  [facility] Office Info P: [phone] F: [phone]

## 2018-12-02 NOTE — Progress Notes (Signed)
Patient declined RN to update family

## 2018-12-03 LAB — CBC
HCT: 48.9 % — ABNORMAL HIGH (ref 36.0–46.0)
Hemoglobin: 15.8 g/dL — ABNORMAL HIGH (ref 12.0–15.0)
MCH: 29.9 pg (ref 26.0–34.0)
MCHC: 32.3 g/dL (ref 30.0–36.0)
MCV: 92.6 fL (ref 80.0–100.0)
Platelets: 431 10*3/uL — ABNORMAL HIGH (ref 150–400)
RBC: 5.28 MIL/uL — ABNORMAL HIGH (ref 3.87–5.11)
RDW: 12.6 % (ref 11.5–15.5)
WBC: 13.7 10*3/uL — ABNORMAL HIGH (ref 4.0–10.5)
nRBC: 0 % (ref 0.0–0.2)

## 2018-12-03 MED ORDER — PANTOPRAZOLE SODIUM 40 MG PO TBEC: 40.0000 mg | DELAYED_RELEASE_TABLET | Freq: Every day | ORAL | 0 refills | Status: DC

## 2018-12-03 MED ORDER — PREDNISONE 10 MG (21) PO TBPK: ORAL_TABLET | ORAL | 0 refills | Status: DC

## 2018-12-03 NOTE — Discharge Summary (Signed)
Katrina Carlson, is a 33 y.o. female  DOB May 18, 1985  MRN [id number].  Admission date:  11/24/2018  Admitting Physician  Lenore Cordia, MD  Discharge Date:  12/03/2018   Primary MD  Louretta Shorten, MD  Recommendations for primary care physician for things to follow:  - please check CBC,CMP during next visit.   Admission Diagnosis  Acute respiratory failure with hypoxia (HCC) [J96.01] Pneumonia of right middle lobe due to infectious organism [J18.9] Pneumonia due to COVID-19 virus [U07.1, J12.89]   Discharge Diagnosis  Acute respiratory failure with hypoxia ([place]) [J96.01] Pneumonia of right middle lobe due to infectious organism [J18.9] Pneumonia due to COVID-19 virus [U07.1, J12.89]    Active Problems:   Acute respiratory disease due to COVID-19 virus      History reviewed. No pertinent past medical history.  Past Surgical History:  Procedure Laterality Date  . FRACTURE SURGERY    . WRIST SURGERY         History of present illness and  Hospital Course:     Kindly see H&P for history of present illness and admission details, please review complete Labs, Consult reports and Test reports for all details in brief  HPI  from the history and physical done on the day of admission 11/24/2018  HPI: Katrina Carlson is a 33 y.o. female with medical history significant for morbid obesity who presents to the ED for evaluation of progressive worsening shortness of breath, cough productive of scant yellow/brown sputum, myalgias, rhinorrhea, subjective fevers, diaphoresis, chills, and nausea without vomiting.  Patient states symptoms began on 11/20/2018.  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  She attempted management at home but had continued progressive symptoms therefore presented to the ED for further  evaluation.  Patient denies any smoking, alcohol, or illicit drug use.  She reports a history of breast cancer in her mother and diabetes in her father.   ED Course:  Initial vitals showed BP 120/73, pulse 108, RR 26, temp 100.1 Fahrenheit, SPO2 reportedly 90% on room air improved to 96% on 2 L supplemental O2 via [place].  Labs notable for WBC 4.5, hemoglobin 16.2, platelets 259,000, sodium 134, potassium 3.7, bicarb 22, BUN 8, creatinine 0.83, AST 99, ALT 90, alk phos 50, total bilirubin 0.6, lactic acid 1.0, d-dimer 0.77, LDH 319, ferritin 179, fibrinogen 533, CRP 1.8.  I-STAT beta-hCG is negative.  Repeat SARS-CoV-2 test was ordered and pending.  Blood cultures were drawn and pending.  Portable chest x-ray showed right upper lobe consolidative airspace opacity with heterogeneous opacity bilaterally most notably in the lung bases.  Patient was given IV ceftriaxone and azithromycin and the hospitalist service was consulted for further evaluation and management.    Hospital Course   Acute Hypoxic Respiratory Failure due to Covid-19 Viral Illness -Chest x-ray significant for bilateral opacities, initially with rapid increase in her oxygen requirement, she was on 12 L nasal cannula ,  she was treated with IV Remdesivir, IV Solu-Medrol, she did receive convulsant plasma, and received Actemra as well, and she was kept on PRN diuresis, she was compliant with out of bed to chair, flutter valve, incentive spirometry and with prone position, she had significant improvement in her hypoxia, currently 0 to 1 L nasal cannula, requiring oxygen only on ambulation, she will be discharged home with oxygen . -she Will be discharged on prednisone taper -She was on PRN Lasix, she appears to be euvolemic at the time of discharge, no indication for Lasix on discharge.  COVID-19 Labs  Recent Labs    12/01/18 0855 12/02/18 0240  DDIMER 0.70* 0.68*    No results found for: [place]  Morbid obesity  -Patient will benefit from significant weight loss   Discharge Condition:  stable   Discharge Instructions  and  Discharge Medications   Discharge Instructions    Discharge instructions   Complete by: As directed    Follow with Primary MD Louretta Shorten, MD in 14 days   Get CBC, CMP,checked  by Primary MD next visit.    Activity: As tolerated with Full fall precautions use walker/cane & assistance as needed   Disposition Home    Diet: Regular diet   On your next visit with your primary care physician please Get Medicines reviewed and adjusted.   Please request your Prim.MD to go over all Hospital Tests and Procedure/Radiological results at the follow up, please get all Hospital records sent to your Prim MD by signing hospital release before you go home.   If you experience worsening of your admission symptoms, develop shortness of breath, life threatening emergency, suicidal or homicidal thoughts you must seek medical attention immediately by calling 911 or calling your MD immediately  if symptoms less severe.  You Must read complete instructions/literature along with all the possible adverse reactions/side effects for all the Medicines you take and that have been prescribed to you. Take any new Medicines after you have completely understood and accpet all the possible adverse reactions/side effects.   Do not drive, operating heavy machinery, perform activities at heights, swimming or participation in water activities or provide baby sitting services if your were admitted for syncope or siezures until you have seen by Primary MD or a Neurologist and advised to do so again.  Do not drive when taking Pain medications.    Do not take more than prescribed Pain, Sleep and Anxiety Medications  Special Instructions: If you have smoked or chewed Tobacco  in the last 2 yrs please stop smoking, stop any regular Alcohol  and or any Recreational drug use.  Wear Seat belts while  driving.   Please note  You were cared for by a hospitalist during your hospital stay. If you have any questions about your discharge medications or the care you received while you were in the hospital after you are discharged, you can call the unit and asked to speak with the hospitalist on call if the hospitalist that took care of you is not available. Once you are discharged, your primary care physician will handle any further medical issues. Please note that NO REFILLS for any discharge medications will be authorized once you are discharged, as it is imperative that you return to your primary care physician (or establish a relationship with a primary care physician if you do not have one) for your aftercare needs so that they can reassess your need for medications and monitor your lab values.   Increase  activity slowly   Complete by: As directed      Allergies as of 12/03/2018   No Known Allergies     Medication List    STOP taking these medications   cyclobenzaprine 10 MG tablet Commonly known as: FLEXERIL   HYDROcodone-acetaminophen 5-325 MG tablet Commonly known as: NORCO/VICODIN   ibuprofen 200 MG tablet Commonly known as: ADVIL   meloxicam 15 MG tablet Commonly known as: MOBIC   predniSONE 20 MG tablet Commonly known as: DELTASONE Replaced by: predniSONE 10 MG (21) Tbpk tablet     TAKE these medications   albuterol 108 (90 Base) MCG/ACT inhaler Commonly known as: VENTOLIN HFA Inhale 2 puffs into the lungs every 4 (four) hours as needed for shortness of breath.   benzonatate 100 MG capsule Commonly known as: TESSALON Take 100 mg by mouth 3 (three) times daily as needed for cough.   pantoprazole 40 MG tablet Commonly known as: PROTONIX Take 1 tablet (40 mg total) by mouth daily.   predniSONE 10 MG (21) Tbpk tablet Commonly known as: STERAPRED UNI-PAK 21 TAB Please use per package instruction Replaces: predniSONE 20 MG tablet            Durable Medical  Equipment  (From admission, onward)         Start     Ordered   12/02/18 1123  For home use only DME oxygen  Once    Question Answer Comment  Length of Need 6 Months   Mode or (Route) Nasal cannula   Liters per Minute 2   Frequency Continuous (stationary and portable oxygen unit needed)   Oxygen conserving device Yes   Oxygen delivery system Gas      12/02/18 1123            Diet and Activity recommendation: See Discharge Instructions above   Consults obtained -  None   Major procedures and Radiology Reports - PLEASE review detailed and final reports for all details, in brief -     Dg Chest Port 1 View  Result Date: 11/26/2018 CLINICAL DATA:  Hypoxia EXAM: PORTABLE CHEST 1 VIEW COMPARISON:  11/24/2018, 05/29/2016 FINDINGS: Low lung volumes. Worsening of bilateral lower lung airspace opacities. Stable cardiomediastinal silhouette. No pneumothorax. IMPRESSION: Low lung volumes with interval worsening of bilateral lower lung airspace disease/suspected pneumonia Electronically Signed   By: Donavan Foil M.D.   On: 11/26/2018 19:42   Dg Chest Port 1 View  Result Date: 11/24/2018 CLINICAL DATA:  COVID-19, difficulty breathing EXAM: PORTABLE CHEST 1 VIEW COMPARISON:  05/29/2016 FINDINGS: The heart size and mediastinal contours are within normal limits. There is consolidative airspace opacity of the peripheral right upper lobe and heterogeneous opacity elsewhere bilaterally, particularly the lung bases. The visualized skeletal structures are unremarkable. IMPRESSION: There is consolidative airspace opacity of the peripheral right upper lobe and heterogeneous opacity elsewhere bilaterally, particularly the lung bases. Findings are consistent with multifocal infection and reported COVID-19 diagnosis, including the possibility of bacterial superinfection given focal consolidation in the right lung. Electronically Signed   By: Eddie Candle M.D.   On: 11/24/2018 19:29    Micro Results      Recent Results (from the past 240 hour(s))  Blood Culture (routine x 2)     Status: None   Collection Time: 11/24/18  7:40 PM   Specimen: BLOOD  Result Value Ref Range Status   Specimen Description   Final    BLOOD LEFT ANTECUBITAL Performed at [facility], [place]  [address], [place], [place] [address]    Special Requests   Final    BOTTLES DRAWN AEROBIC AND ANAEROBIC Blood Culture results may not be optimal due to an excessive volume of blood received in culture bottles Performed at [place] [address] [place], [place] [address]    Culture   Final    NO GROWTH 5 DAYS Performed at [facility], [place] [address], [place], [place] [address]    Report Status 11/29/2018 FINAL  Final       Today   Subjective:   Katrina Carlson today has no headache,no chest or abdominal pain,no new weakness tingling or numbness, feels much better wants to go home today.  Is able to ambulate in the hallway today, she will be discharged on oxygen.  Objective:   Blood pressure 124/74, pulse 96, temperature 98.4 F (36.9 C), temperature source Oral, resp. rate 16, height 5' 7"  (1.702 m), weight (!) 148.6 kg, last menstrual period 11/18/2018, SpO2 91 %.   Intake/Output Summary (Last 24 hours) at 12/03/2018 1315 Last data filed at 12/02/2018 2200 Gross per 24 hour  Intake 250 ml  Output -  Net 250 ml    Exam Awake Alert, Oriented x 3, No new F.N deficits, Normal affect  Symmetrical Chest wall movement, Good air movement bilaterally, CTAB RRR,No Gallops,Rubs or new Murmurs, No Parasternal Heave +ve B.Sounds, Abd Soft, Non tender, No rebound -guarding or rigidity. No Cyanosis, Clubbing or edema, No new Rash or bruise  Data Review   CBC w Diff:  Lab Results  Component Value Date   WBC 13.7 (H) 12/03/2018   HGB 15.8 (H) 12/03/2018   HCT 48.9 (H) 12/03/2018   PLT 431 (H) 12/03/2018   LYMPHOPCT 21 11/24/2018   MONOPCT 11 11/24/2018   EOSPCT 0  11/24/2018   BASOPCT 0 11/24/2018    CMP:  Lab Results  Component Value Date   NA 135 12/02/2018   K 4.9 12/02/2018   CL 100 12/02/2018   CO2 25 12/02/2018   BUN 19 12/02/2018   CREATININE 0.66 12/02/2018   PROT 7.1 12/02/2018   ALBUMIN 3.1 (L) 12/02/2018   BILITOT 0.9 12/02/2018   ALKPHOS 41 12/02/2018   AST 16 12/02/2018   ALT 34 12/02/2018  .   Total Time in preparing paper work, data evaluation and todays exam - 3 minutes  Phillips Climes M.D on 12/03/2018 at 1:15 PM  [place]  [phone]

## 2018-12-03 NOTE — Discharge Instructions (Signed)
Person Under Monitoring Name: Katrina Carlson  Location: [address] [place] [address]   Infection Prevention Recommendations for Individuals Confirmed to have, or Being Evaluated for, 2019 Novel Coronavirus (COVID-19) Infection Who Receive Care at Home  Individuals who are confirmed to have, or are being evaluated for, COVID-19 should follow the prevention steps below until a healthcare provider or local or state health department says they can return to normal activities.  Stay home except to get medical care You should restrict activities outside your home, except for getting medical care. Do not go to work, school, or public areas, and do not use public transportation or taxis.  Call ahead before visiting your doctor Before your medical appointment, call the healthcare provider and tell them that you have, or are being evaluated for, COVID-19 infection. This will help the healthcare providers office take steps to keep other people from getting infected. Ask your healthcare provider to call the local or state health department.  Monitor your symptoms Seek prompt medical attention if your illness is worsening (e.g., difficulty breathing). Before going to your medical appointment, call the healthcare provider and tell them that you have, or are being evaluated for, COVID-19 infection. Ask your healthcare provider to call the local or state health department.  Wear a facemask You should wear a facemask that covers your nose and mouth when you are in the same room with other people and when you visit a healthcare provider. People who live with or visit you should also wear a facemask while they are in the same room with you.  Separate yourself from other people in your home As much as possible, you should stay in a different room from other people in your home. Also, you should use a separate bathroom, if available.  Avoid sharing household items You  should not share dishes, drinking glasses, cups, eating utensils, towels, bedding, or other items with other people in your home. After using these items, you should wash them thoroughly with soap and water.  Cover your coughs and sneezes Cover your mouth and nose with a tissue when you cough or sneeze, or you can cough or sneeze into your sleeve. Throw used tissues in a lined trash can, and immediately wash your hands with soap and water for at least 20 seconds or use an alcohol-based hand rub.  Wash your [facility] your hands often and thoroughly with soap and water for at least 20 seconds. You can use an alcohol-based hand sanitizer if soap and water are not available and if your hands are not visibly dirty. Avoid touching your eyes, nose, and mouth with unwashed hands.   Prevention Steps for Caregivers and Household Members of Individuals Confirmed to have, or Being Evaluated for, COVID-19 Infection Being Cared for in the Home  If you live with, or provide care at home for, a person confirmed to have, or being evaluated for, COVID-19 infection please follow these guidelines to prevent infection:  Follow healthcare providers instructions Make sure that you understand and can help the patient follow any healthcare provider instructions for all care.  Provide for the patients basic needs You should help the patient with basic needs in the home and provide support for getting groceries, prescriptions, and other personal needs.  Monitor the patients symptoms If they are getting sicker, call his or her medical provider and tell them that the patient has, or is being evaluated for, COVID-19 infection. This will help the healthcare  providers office take steps to keep other people from getting infected. Ask the healthcare provider to call the local or state health department.  Limit the number of people who have contact with the patient  If possible, have only one caregiver for the  patient.  Other household members should stay in another home or place of residence. If this is not possible, they should stay  in another room, or be separated from the patient as much as possible. Use a separate bathroom, if available.  Restrict visitors who do not have an essential need to be in the home.  Keep older adults, very young children, and other sick people away from the patient Keep older adults, very young children, and those who have compromised immune systems or chronic health conditions away from the patient. This includes people with chronic heart, lung, or kidney conditions, diabetes, and cancer.  Ensure good ventilation Make sure that shared spaces in the home have good air flow, such as from an air conditioner or an opened window, weather permitting.  Wash your hands often  Wash your hands often and thoroughly with soap and water for at least 20 seconds. You can use an alcohol based hand sanitizer if soap and water are not available and if your hands are not visibly dirty.  Avoid touching your eyes, nose, and mouth with unwashed hands.  Use disposable paper towels to dry your hands. If not available, use dedicated cloth towels and replace them when they become wet.  Wear a facemask and gloves  Wear a disposable facemask at all times in the room and gloves when you touch or have contact with the patients blood, body fluids, and/or secretions or excretions, such as sweat, saliva, sputum, nasal mucus, vomit, urine, or feces.  Ensure the mask fits over your nose and mouth tightly, and do not touch it during use.  Throw out disposable facemasks and gloves after using them. Do not reuse.  Wash your hands immediately after removing your facemask and gloves.  If your personal clothing becomes contaminated, carefully remove clothing and launder. Wash your hands after handling contaminated clothing.  Place all used disposable facemasks, gloves, and other waste in a lined  container before disposing them with other household waste.  Remove gloves and wash your hands immediately after handling these items.  Do not share dishes, glasses, or other household items with the patient  Avoid sharing household items. You should not share dishes, drinking glasses, cups, eating utensils, towels, bedding, or other items with a patient who is confirmed to have, or being evaluated for, COVID-19 infection.  After the person uses these items, you should wash them thoroughly with soap and water.  Wash laundry thoroughly  Immediately remove and wash clothes or bedding that have blood, body fluids, and/or secretions or excretions, such as sweat, saliva, sputum, nasal mucus, vomit, urine, or feces, on them.  Wear gloves when handling laundry from the patient.  Read and follow directions on labels of laundry or clothing items and detergent. In general, wash and dry with the warmest temperatures recommended on the label.  Clean all areas the individual has used often  Clean all touchable surfaces, such as counters, tabletops, doorknobs, bathroom fixtures, toilets, phones, keyboards, tablets, and bedside tables, every day. Also, clean any surfaces that may have blood, body fluids, and/or secretions or excretions on them.  Wear gloves when cleaning surfaces the patient has come in contact with.  Use a diluted bleach solution (e.g., dilute bleach with  1 part bleach and 10 parts water) or a household disinfectant with a label that says EPA-registered for coronaviruses. To make a bleach solution at home, add 1 tablespoon of bleach to 1 quart (4 cups) of water. For a larger supply, add  cup of bleach to 1 gallon (16 cups) of water.  Read labels of cleaning products and follow recommendations provided on product labels. Labels contain instructions for safe and effective use of the cleaning product including precautions you should take when applying the product, such as wearing gloves or  eye protection and making sure you have good ventilation during use of the product.  Remove gloves and wash hands immediately after cleaning.  Monitor yourself for signs and symptoms of illness Caregivers and household members are considered close contacts, should monitor their health, and will be asked to limit movement outside of the home to the extent possible. Follow the monitoring steps for close contacts listed on the symptom monitoring form.   ? If you have additional questions, contact your local health department or call the epidemiologist on call at [phone] (available 24/7). ? This guidance is subject to change. For the most up-to-date guidance from [facility], please refer to their website: [email or web address]

## 2018-12-03 NOTE — Progress Notes (Signed)
Patient ambulated 50 ft in hallway. SPO2 dropped to 87%. Ambulated back to room on 2L/[place].

## 2018-12-20 ENCOUNTER — Other Ambulatory Visit: Payer: Self-pay

## 2018-12-28 IMAGING — DX DG CHEST 2V
2 series · 2 of 2 positions shown · non-contrast
Comparison: None.

CLINICAL DATA: Intermittent chest pain and shortness of breath.

EXAM:
CHEST  2 VIEW

[chest pa]
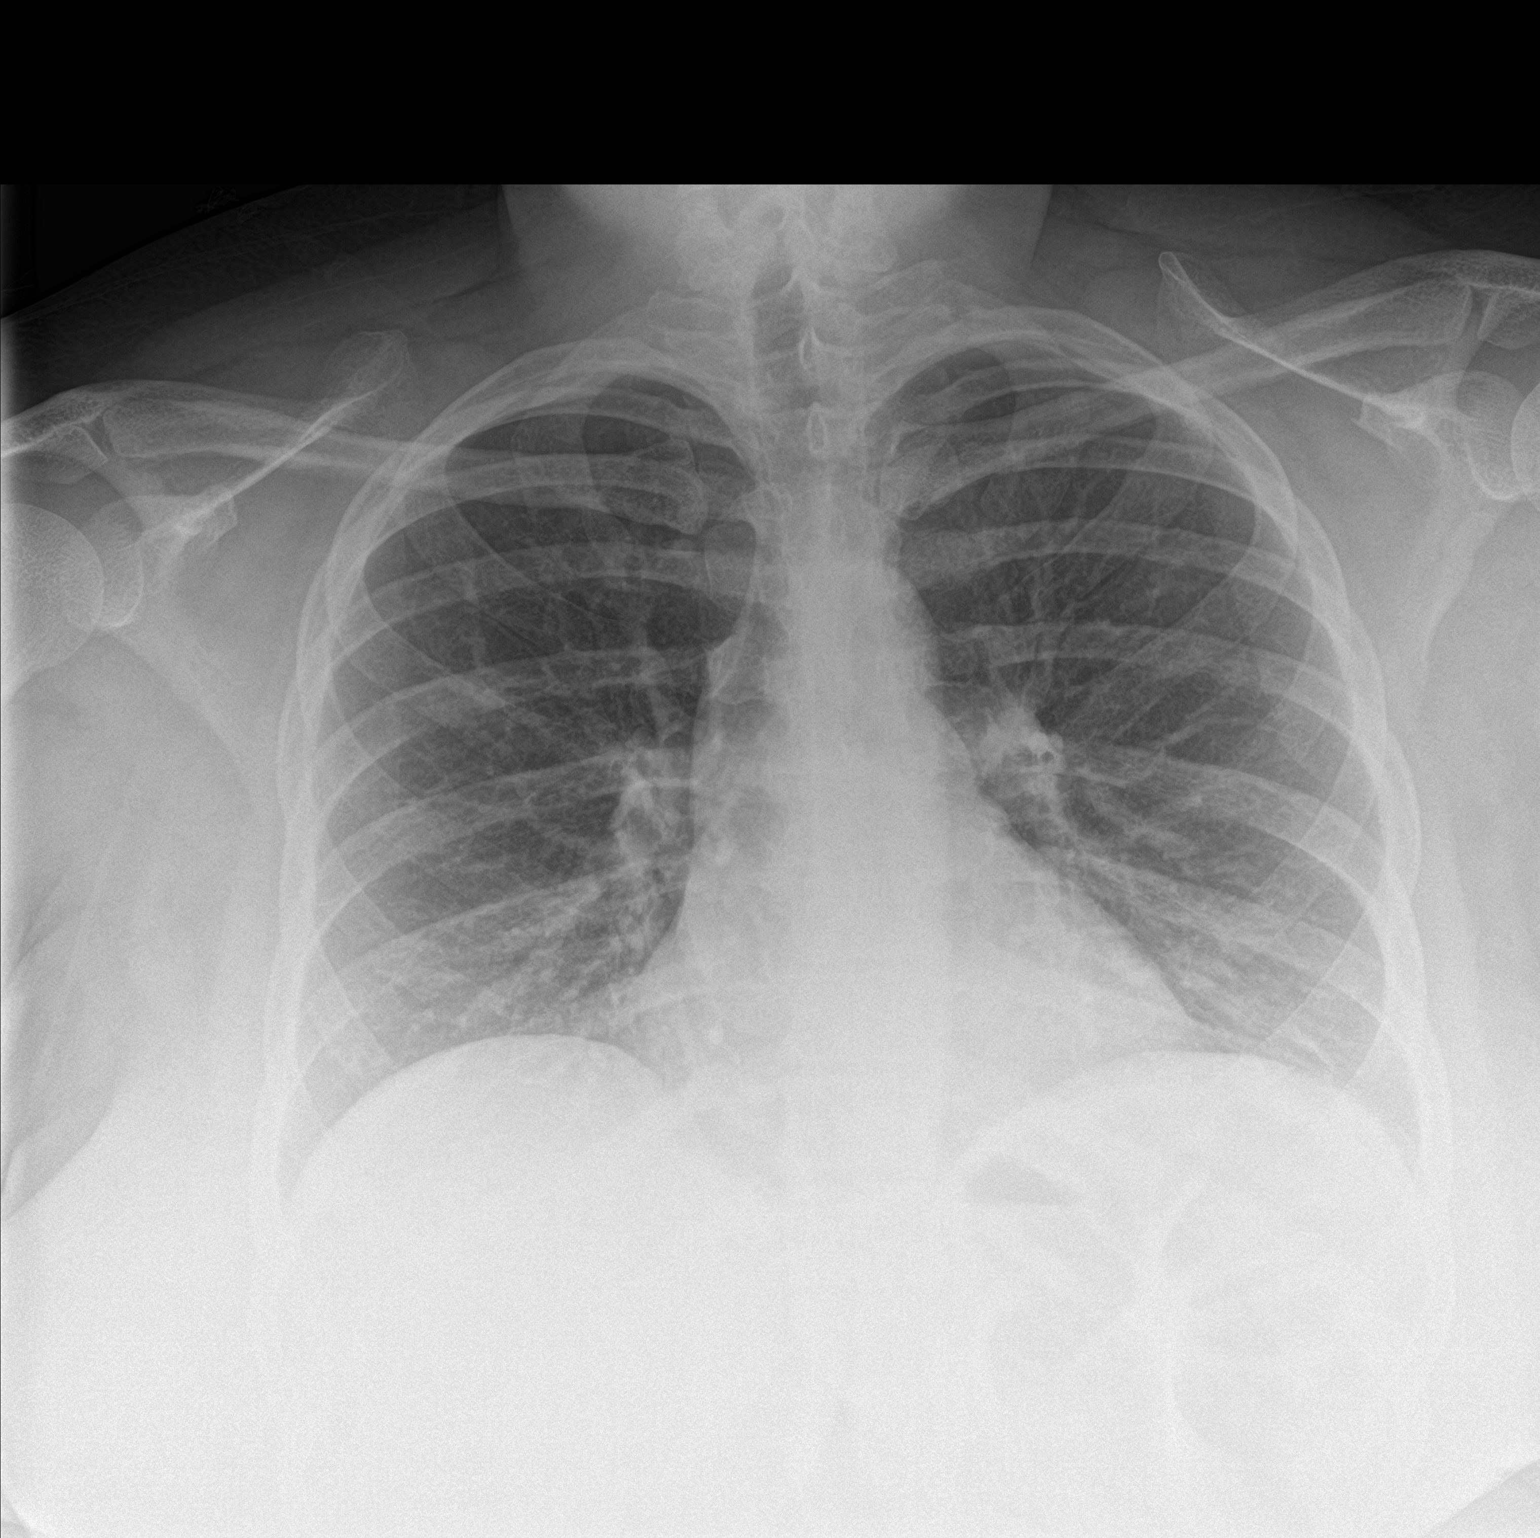

[chest lat]
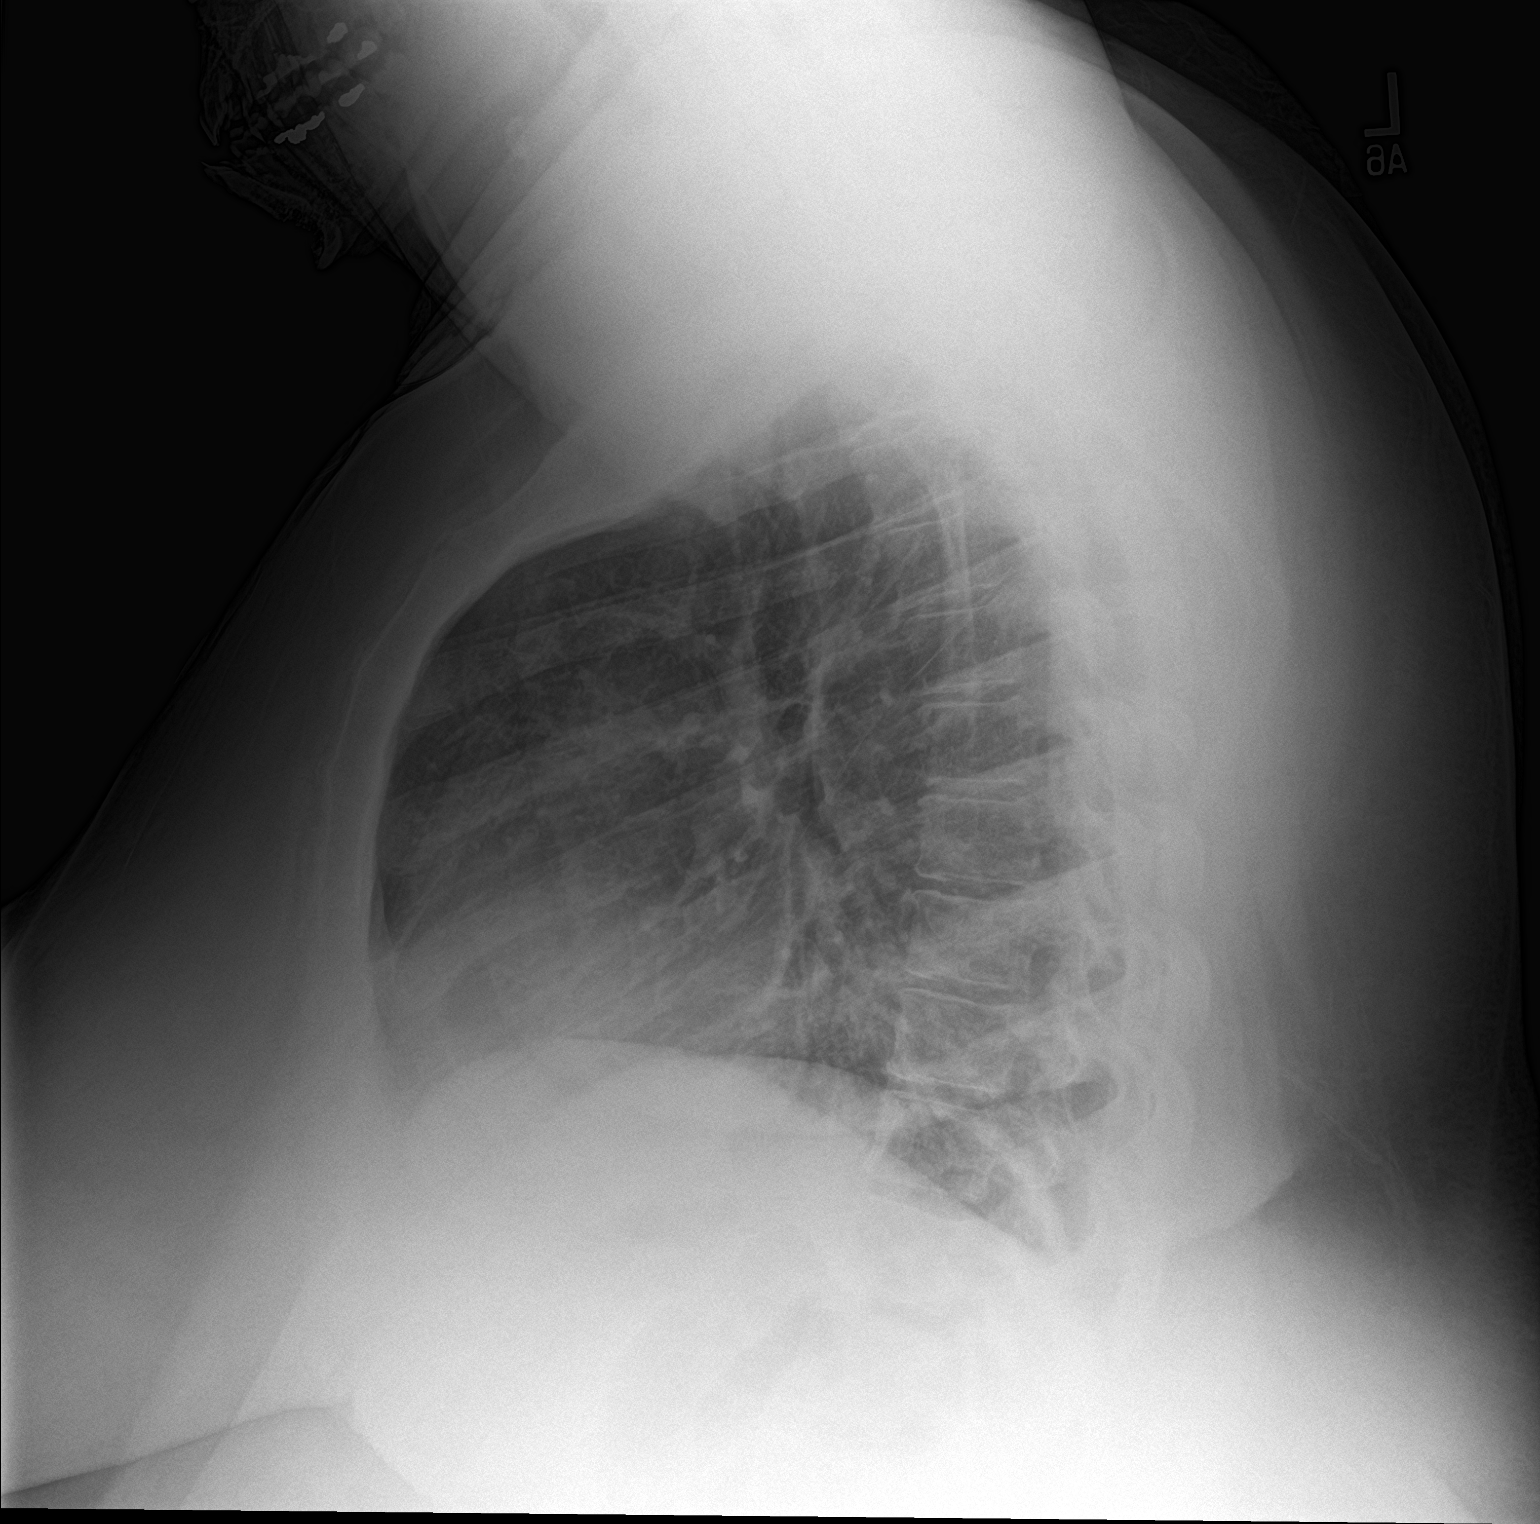

[2 of 2 positions shown; findings below may reference images not displayed]

FINDINGS: The heart size and mediastinal contours are within normal limits.
Both lungs are clear. The visualized skeletal structures are
unremarkable. Increased body habitus.
IMPRESSION: No active cardiopulmonary disease.

## 2019-04-29 ENCOUNTER — Ambulatory Visit (INDEPENDENT_AMBULATORY_CARE_PROVIDER_SITE_OTHER): Payer: BC Managed Care – PPO | Admitting: Family Medicine

## 2019-04-29 ENCOUNTER — Other Ambulatory Visit: Payer: Self-pay

## 2019-04-29 ENCOUNTER — Encounter (INDEPENDENT_AMBULATORY_CARE_PROVIDER_SITE_OTHER): Payer: Self-pay | Admitting: Family Medicine

## 2019-04-29 VITALS — BP 143/83 | HR 68 | Temp 98.2°F | Ht 67.0 in | Wt 331.0 lb

## 2019-04-29 DIAGNOSIS — R0602 Shortness of breath: Secondary | ICD-10-CM

## 2019-04-29 DIAGNOSIS — Z6841 Body Mass Index (BMI) 40.0 and over, adult: Secondary | ICD-10-CM

## 2019-04-29 DIAGNOSIS — G4719 Other hypersomnia: Secondary | ICD-10-CM

## 2019-04-29 DIAGNOSIS — R7303 Prediabetes: Secondary | ICD-10-CM | POA: Diagnosis not present

## 2019-04-29 DIAGNOSIS — Z1331 Encounter for screening for depression: Secondary | ICD-10-CM | POA: Diagnosis not present

## 2019-04-29 DIAGNOSIS — I10 Essential (primary) hypertension: Secondary | ICD-10-CM | POA: Diagnosis not present

## 2019-04-29 DIAGNOSIS — Z9189 Other specified personal risk factors, not elsewhere classified: Secondary | ICD-10-CM | POA: Diagnosis not present

## 2019-04-29 DIAGNOSIS — R5383 Other fatigue: Secondary | ICD-10-CM

## 2019-04-29 DIAGNOSIS — Z0289 Encounter for other administrative examinations: Secondary | ICD-10-CM

## 2019-04-29 NOTE — Progress Notes (Signed)
Dear Dr. Kateri Plummer,   Thank you for referring Katrina Carlson to our clinic. The following note includes my evaluation and treatment recommendations.  Chief Complaint:   OBESITY Katrina Carlson (MR# [id number]) is a 34 y.o. female who presents for evaluation and treatment of obesity and related comorbidities. Current BMI is Body mass index is 51.84 kg/m. Katrina Carlson has been struggling with her weight for many years and has been unsuccessful in either losing weight, maintaining weight loss, or reaching her healthy weight goal.  Katrina Carlson is currently in the action stage of change and ready to dedicate time achieving and maintaining a healthier weight. Katrina Carlson is interested in becoming our patient and working on intensive lifestyle modifications including (but not limited to) diet and exercise for weight loss.  Katrina Carlson reports that she had COVID last October.  She was in the hospital for 8 days and was on steroids.  Her A1c increased to 6.5 around that time.  It is now 5.8 and she is in a diabetes prevention program.  Her A1c will be rechecked with per PCP.  Katrina Carlson's habits were reviewed today and are as follows: Her family eats meals together, she thinks her family will eat healthier with her, her desired weight loss is 328 pounds, she has been heavy most of her life, she started gaining weight in middle school, her heaviest weight ever was 340 pounds, she craves soul food, [place] food, and hibachi, she skips breakfast almost daily, she is frequently drinking liquids with calories, she frequently makes poor food choices, she frequently eats larger portions than normal and she struggles with emotional eating.  Depression Screen Katrina Carlson's Food and Mood (modified PHQ-9) score was 5.  Depression screen PHQ 2/9 04/29/2019  Decreased Interest 1  Down, Depressed, Hopeless 0  PHQ - 2 Score 1  Altered sleeping 0  Tired, decreased energy 1  Change in appetite 1  Feeling bad or failure about  yourself  1  Trouble concentrating 1  Moving slowly or fidgety/restless 0  Suicidal thoughts 0  PHQ-9 Score 5  Difficult doing work/chores Not difficult at all   Subjective:   1. Other fatigue Katrina Carlson admits to daytime somnolence and denies waking up still tired.  Katrina Carlson generally gets 5 or 6 hours of sleep per night, and states that she has generally restful sleep. Snoring is sometimes present. Apneic episodes are not present. Epworth Sleepiness Score is 6.  2. SOB (shortness of breath) on exertion Katrina Carlson notes increasing shortness of breath with exercising and seems to be worsening over time with weight gain. She notes getting out of breath sooner with activity than she used to. This has not gotten worse recently. Katrina Carlson denies shortness of breath at rest or orthopnea.  3. Essential hypertension Review: taking medications as instructed, no medication side effects noted, no chest pain on exertion, no dyspnea on exertion, no swelling of ankles.   BP Readings from Last 3 Encounters:  04/29/19 (!) 143/83  12/03/18 130/72  02/15/17 122/88   4. Prediabetes Katrina Carlson has a diagnosis of prediabetes based on her elevated HgA1c and was informed this puts her at greater risk of developing diabetes. She continues to work on diet and exercise to decrease her risk of diabetes. She denies nausea or hypoglycemia.  5. Excessive daytime sleepiness Katrina Carlson snores and gets about 5-6 hours of sleep per night.  Situation Chance of Dozing or Sleeping  Sitting and reading 1 = slight chance of dozing or sleeping  Watching television 1 =  slight chance of dozing or sleeping  Sitting inactive in a public place (theater or meeting) 0 = would never doze or sleep  Lying down in the afternoon when circumstances permit 2 = moderate chance of dozing or sleeping  Sitting as a passenger in a car for an hour 1 = slight chance of dozing or sleeping  Sitting and talking to someone 0 = would never doze or sleep    Sitting quietly after lunch without alcohol 1 = slight chance of dozing or sleeping  In a car, while stopped for a few minutes in traffic 0 = would never doze or sleep  TOTAL 6   Assessment/Plan:   1. Other fatigue [name] does not feel that her weight is causing her energy to be lower than it should be. Fatigue may be related to obesity, depression or many other causes. Labs will be ordered, and in the meanwhile, [name] will focus on self care including making healthy food choices, increasing physical activity and focusing on stress reduction.  Orders - EKG 12-Lead - VITAMIN D 25 Hydroxy (Vit-D Deficiency, Fractures) - TSH - T4, free - T3 - Anemia panel  2. SOB (shortness of breath) on exertion Katrina Carlson does not feel that she gets out of breath more easily that she used to when she exercises. Katrina Carlson's shortness of breath appears to be obesity related and exercise induced. She has agreed to work on weight loss and gradually increase exercise to treat her exercise induced shortness of breath. Will continue to monitor closely.  3. Essential hypertension Katrina Carlson is working on healthy weight loss and exercise to improve blood pressure control. We will watch for signs of hypotension as she continues her lifestyle modifications.  Orders - Lipid panel  4. Prediabetes Katrina Carlson will continue to work on weight loss, exercise, and decreasing simple carbohydrates to help decrease the risk of diabetes.   Orders - Comprehensive metabolic panel - CBC with Differential/Platelet  5. Excessive daytime sleepiness Counseling: Intensive lifestyle modifications are the first line treatment for this issue. We discussed several lifestyle modifications today and she will continue to work on diet, exercise and weight loss efforts. We will continue to monitor. Orders and follow up as documented in patient record.  6. Depression screening [name] had a positive depression screening. Depression is commonly  associated with obesity and often results in emotional eating behaviors. We will monitor this closely and work on CBT to help improve the non-hunger eating patterns. Referral to Psychology may be required if no improvement is seen as she continues in our clinic.  7. At risk for heart disease Katrina Carlson was given approximately 15 minutes of coronary artery disease prevention counseling today. She is 34 y.o. female and has risk factors for heart disease including obesity. We discussed intensive lifestyle modifications today with an emphasis on specific weight loss instructions and strategies.   8. 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 I recommend Katrina Carlson begin the structured treatment plan as follows:  She has agreed to the Category 3 Plan.  Exercise goals: No exercise has been prescribed at this time.   Behavioral modification strategies: increasing lean protein intake, decreasing simple carbohydrates, increasing vegetables, increasing water intake and decreasing liquid calories.  She was informed of the importance of frequent follow-up visits to maximize her success with  intensive lifestyle modifications for her multiple health conditions. She was informed we would discuss her lab results at her next visit unless there is a critical issue that needs to be addressed sooner. Katrina Carlson agreed to keep her next visit at the agreed upon time to discuss these results.  Objective:   Blood pressure (!) 143/83, pulse 68, temperature 98.2 F (36.8 C), temperature source Oral, height 5\' 7"  (1.702 m), weight (!) 331 lb (150.1 kg), last menstrual period 03/28/2019, SpO2 100 %. Body mass index is 51.84 kg/m.  EKG: Normal sinus rhythm, rate 80 bpm.  Indirect Calorimeter completed today shows a VO2 of 304 and a REE of  2119.  Her calculated basal metabolic rate is 6734 thus her basal metabolic rate is worse than expected.  General: Cooperative, alert, well developed, in no acute distress. HEENT: Conjunctivae and lids unremarkable. Cardiovascular: Regular rhythm.  Lungs: Normal work of breathing. Neurologic: No focal deficits.   Lab Results  Component Value Date   CREATININE 0.66 12/02/2018   BUN 19 12/02/2018   NA 135 12/02/2018   K 4.9 12/02/2018   CL 100 12/02/2018   CO2 25 12/02/2018   Lab Results  Component Value Date   ALT 34 12/02/2018   AST 16 12/02/2018   ALKPHOS 41 12/02/2018   BILITOT 0.9 12/02/2018   Lab Results  Component Value Date   TRIG 114 11/24/2018   Lab Results  Component Value Date   WBC 13.7 (H) 12/03/2018   HGB 15.8 (H) 12/03/2018   HCT 48.9 (H) 12/03/2018   MCV 92.6 12/03/2018   PLT 431 (H) 12/03/2018   Lab Results  Component Value Date   FERRITIN 155 11/27/2018   Attestation Statements:   This is the patient's first visit at [facility]. The patient's NEW PATIENT PACKET was reviewed at length. Included in the packet: current and past health history, medications, allergies, ROS, gynecologic history (women only), surgical history, family history, social history, weight history, weight loss surgery history (for those that have had weight loss surgery), nutritional evaluation, mood and food questionnaire, PHQ9, Epworth questionnaire, sleep habits questionnaire, patient life and health improvement goals questionnaire. These will all be scanned into the patient's chart under media.   During the visit, I independently reviewed the patient's EKG, bioimpedance scale results, and indirect calorimeter results. I used this information to tailor a meal plan for the patient that will help her to lose weight and will improve her obesity-related conditions going forward. I performed a medically necessary appropriate examination and/or evaluation. I discussed the  assessment and treatment plan with the patient. The patient was provided an opportunity to ask questions and all were answered. The patient agreed with the plan and demonstrated an understanding of the instructions. Labs were ordered at this visit and will be reviewed at the next visit unless more critical results need to be addressed immediately. Clinical information was updated and documented in the EMR.   I, Water quality scientist, CMA, am acting as Location manager for [employer], DO.  I have reviewed the above documentation for accuracy and completeness, and I agree with the above. Briscoe Deutscher, DO

## 2019-04-30 LAB — CBC WITH DIFFERENTIAL/PLATELET
Basophils Absolute: 0.1 10*3/uL (ref 0.0–0.2)
Basos: 1 %
EOS (ABSOLUTE): 0.2 10*3/uL (ref 0.0–0.4)
Eos: 2 %
Hemoglobin: 14.2 g/dL (ref 11.1–15.9)
Immature Grans (Abs): 0 10*3/uL (ref 0.0–0.1)
Immature Granulocytes: 0 %
Lymphocytes Absolute: 1.9 10*3/uL (ref 0.7–3.1)
Lymphs: 22 %
MCH: 31.6 pg (ref 26.6–33.0)
MCHC: 34.8 g/dL (ref 31.5–35.7)
MCV: 91 fL (ref 79–97)
Monocytes Absolute: 0.5 10*3/uL (ref 0.1–0.9)
Monocytes: 6 %
Neutrophils Absolute: 6 10*3/uL (ref 1.4–7.0)
Neutrophils: 69 %
Platelets: 318 10*3/uL (ref 150–450)
RBC: 4.5 x10E6/uL (ref 3.77–5.28)
RDW: 12.6 % (ref 11.7–15.4)
WBC: 8.7 10*3/uL (ref 3.4–10.8)

## 2019-04-30 LAB — COMPREHENSIVE METABOLIC PANEL
ALT: 15 IU/L (ref 0–32)
AST: 18 IU/L (ref 0–40)
Albumin/Globulin Ratio: 1.3 (ref 1.2–2.2)
Albumin: 3.9 g/dL (ref 3.8–4.8)
Alkaline Phosphatase: 54 IU/L (ref 39–117)
BUN/Creatinine Ratio: 13 (ref 9–23)
BUN: 8 mg/dL (ref 6–20)
Bilirubin Total: 0.7 mg/dL (ref 0.0–1.2)
CO2: 22 mmol/L (ref 20–29)
Calcium: 8.6 mg/dL — ABNORMAL LOW (ref 8.7–10.2)
Chloride: 103 mmol/L (ref 96–106)
Creatinine, Ser: 0.61 mg/dL (ref 0.57–1.00)
GFR calc Af Amer: 138 mL/min/{1.73_m2} (ref >59)
GFR calc non Af Amer: 119 mL/min/{1.73_m2} (ref >59)
Globulin, Total: 3 g/dL (ref 1.5–4.5)
Glucose: 105 mg/dL — ABNORMAL HIGH (ref 65–99)
Potassium: 4.3 mmol/L (ref 3.5–5.2)
Sodium: 139 mmol/L (ref 134–144)
Total Protein: 6.9 g/dL (ref 6.0–8.5)

## 2019-04-30 LAB — ANEMIA PANEL
Ferritin: 75 ng/mL (ref 15–150)
Folate, Hemolysate: 304 ng/mL
Folate, RBC: 745 ng/mL (ref >498)
Hematocrit: 40.8 % (ref 34.0–46.6)
Iron Saturation: 47 % (ref 15–55)
Iron: 130 ug/dL (ref 27–159)
Retic Ct Pct: 1.5 % (ref 0.6–2.6)
Total Iron Binding Capacity: 275 ug/dL (ref 250–450)
UIBC: 145 ug/dL (ref 131–425)
Vitamin B-12: 361 pg/mL (ref 232–1245)

## 2019-04-30 LAB — LIPID PANEL
Chol/HDL Ratio: 3.5 ratio (ref 0.0–4.4)
Cholesterol, Total: 142 mg/dL (ref 100–199)
HDL: 41 mg/dL (ref >39)
LDL Chol Calc (NIH): 81 mg/dL (ref 0–99)
Triglycerides: 109 mg/dL (ref 0–149)
VLDL Cholesterol Cal: 20 mg/dL (ref 5–40)

## 2019-04-30 LAB — T4, FREE: Free T4: 0.95 ng/dL (ref 0.82–1.77)

## 2019-04-30 LAB — T3: T3, Total: 152 ng/dL (ref 71–180)

## 2019-04-30 LAB — VITAMIN D 25 HYDROXY (VIT D DEFICIENCY, FRACTURES): Vit D, 25-Hydroxy: 7.9 ng/mL — ABNORMAL LOW (ref 30.0–100.0)

## 2019-04-30 LAB — TSH: TSH: 1.19 u[IU]/mL (ref 0.450–4.500)

## 2019-05-13 ENCOUNTER — Other Ambulatory Visit: Payer: Self-pay

## 2019-05-13 ENCOUNTER — Encounter (INDEPENDENT_AMBULATORY_CARE_PROVIDER_SITE_OTHER): Payer: Self-pay | Admitting: Family Medicine

## 2019-05-13 ENCOUNTER — Ambulatory Visit (INDEPENDENT_AMBULATORY_CARE_PROVIDER_SITE_OTHER): Payer: BC Managed Care – PPO | Admitting: Family Medicine

## 2019-05-13 VITALS — BP 149/82 | HR 81 | Temp 98.4°F | Ht 67.0 in | Wt 329.0 lb

## 2019-05-13 DIAGNOSIS — E538 Deficiency of other specified B group vitamins: Secondary | ICD-10-CM | POA: Diagnosis not present

## 2019-05-13 DIAGNOSIS — I10 Essential (primary) hypertension: Secondary | ICD-10-CM | POA: Diagnosis not present

## 2019-05-13 DIAGNOSIS — E559 Vitamin D deficiency, unspecified: Secondary | ICD-10-CM

## 2019-05-13 DIAGNOSIS — R7303 Prediabetes: Secondary | ICD-10-CM

## 2019-05-13 DIAGNOSIS — Z6841 Body Mass Index (BMI) 40.0 and over, adult: Secondary | ICD-10-CM

## 2019-05-14 MED ORDER — VITAMIN D (ERGOCALCIFEROL) 1.25 MG (50000 UNIT) PO CAPS: 50000.0000 [IU] | ORAL_CAPSULE | ORAL | 0 refills | Status: DC

## 2019-05-14 NOTE — Progress Notes (Signed)
Chief Complaint:   OBESITY Katrina Carlson is here to discuss her progress with her obesity treatment plan along with follow-up of her obesity related diagnoses. Katrina Carlson is on the Category 3 Plan and states she is following her eating plan approximately 80% of the time. Katrina Carlson states she is walking for 30 minutes 4-5 times per week.  Today's visit was #: 2 Starting weight: 331 lbs Starting date: 04/29/2019 Today's weight: 329 lbs Today's date: 05/13/2019 Total lbs lost to date: 2 lbs Total lbs lost since last in-office visit: 2 lbs  Interim History: Katrina Carlson reports she was unable to walk until last week due to foot pain. The pain has improved.   Subjective:   1. Vitamin D deficiency Katrina Carlson's Vitamin D level was 7.9 on 04/29/2019. She is not currently taking vit D. She denies nausea, vomiting or muscle weakness.  2. Vitamin B12 deficiency, mild She is not a vegetarian.  She does not have a previous diagnosis of pernicious anemia.  She does not have a history of weight loss surgery.   Lab Results  Component Value Date   VITAMINB12 361 04/29/2019   3. Essential hypertension Review: taking medications as instructed, no medication side effects noted, no chest pain on exertion, no dyspnea on exertion, no swelling of ankles.   BP Readings from Last 3 Encounters:  05/13/19 (!) 149/82  04/29/19 (!) 143/83  12/03/18 130/72   4. Prediabetes Katrina Carlson has a diagnosis of prediabetes (after high-dose steroids) based on her elevated HgA1c and was informed this puts her at greater risk of developing diabetes. She continues to work on diet and exercise to decrease her risk of diabetes. She denies nausea or hypoglycemia.  Assessment/Plan:   1. Vitamin D deficiency Low Vitamin D level contributes to fatigue and are associated with obesity, breast, and colon cancer. She agrees to start to take prescription Vitamin D @50 ,000 IU every week and will follow-up for routine testing of Vitamin D, at least  2-3 times per year to avoid over-replacement.  Orders - Vitamin D, Ergocalciferol, (DRISDOL) 1.25 MG (50000 UNIT) CAPS capsule; Take 1 capsule (50,000 Units total) by mouth every 7 (seven) days.  Dispense: 4 capsule; Refill: 0  2. Vitamin B12 deficiency, mild We will continue to monitor. Orders and follow up as documented in patient record.  Counseling . The body needs vitamin B12: to make red blood cells; to make DNA; and to help the nerves work properly so they can carry messages from the brain to the body.  . The main causes of vitamin B12 deficiency include dietary deficiency, digestive diseases, pernicious anemia, and having a surgery in which part of the stomach or small intestine is removed.  . Certain medicines can make it harder for the body to absorb vitamin B12. These medicines include: heartburn medications; some antibiotics; some medications used to treat diabetes, gout, and high cholesterol.  . In some cases, there are no symptoms of this condition. If the condition leads to anemia or nerve damage, various symptoms can occur, such as weakness or fatigue, shortness of breath, and numbness or tingling in your hands and feet.   . Treatment:  o May include taking vitamin B12 supplements.  o Avoid alcohol.  o Eat lots of healthy foods that contain vitamin B12: - Beef, pork, chicken, , and organ meats, such as liver.  - Seafood: This includes clams, rainbow trout, salmon, tuna, and haddock. Eggs.  - Cereal and dairy products that are fortified: This means that vitamin  B12 has been added to the food.   3. Essential hypertension Katrina Carlson is working on healthy weight loss and exercise to improve blood pressure control. We will watch for signs of hypotension as she continues her lifestyle modifications.  4. Prediabetes Katrina Carlson will continue to work on weight loss, exercise, and decreasing simple carbohydrates to help decrease the risk of diabetes.   5. Class 3 severe obesity with  serious comorbidity and body mass index (BMI) of 50.0 to 59.9 in adult, unspecified obesity type (HCC) Katrina Carlson is currently in the action stage of change. As such, her goal is to continue with weight loss efforts. She has agreed to the Category 3 Plan.   Exercise goals: Walking  Behavioral modification strategies: increasing lean protein intake and increasing water intake.  Katrina Carlson has agreed to follow-up with our clinic in 2 weeks. She was informed of the importance of frequent follow-up visits to maximize her success with intensive lifestyle modifications for her multiple health conditions.   Objective:   Blood pressure (!) 149/82, pulse 81, temperature 98.4 F (36.9 C), temperature source Oral, height 5\' 7"  (1.702 m), weight (!) 329 lb (149.2 kg), last menstrual period 03/24/2019, SpO2 98 %. Body mass index is 51.53 kg/m.  General: Cooperative, alert, well developed, in no acute distress. HEENT: Conjunctivae and lids unremarkable. Cardiovascular: Regular rhythm.  Lungs: Normal work of breathing. Neurologic: No focal deficits.   Lab Results  Component Value Date   CREATININE 0.61 04/29/2019   BUN 8 04/29/2019   NA 139 04/29/2019   K 4.3 04/29/2019   CL 103 04/29/2019   CO2 22 04/29/2019   Lab Results  Component Value Date   ALT 15 04/29/2019   AST 18 04/29/2019   ALKPHOS 54 04/29/2019   BILITOT 0.7 04/29/2019   Lab Results  Component Value Date   TSH 1.190 04/29/2019   Lab Results  Component Value Date   CHOL 142 04/29/2019   HDL 41 04/29/2019   LDLCALC 81 04/29/2019   TRIG 109 04/29/2019   CHOLHDL 3.5 04/29/2019   Lab Results  Component Value Date   WBC 8.7 04/29/2019   HGB 14.2 04/29/2019   HCT 40.8 04/29/2019   MCV 91 04/29/2019   PLT 318 04/29/2019   Lab Results  Component Value Date   IRON 130 04/29/2019   TIBC 275 04/29/2019   FERRITIN 75 04/29/2019   Attestation Statements:   Reviewed by clinician on day of visit: allergies, medications,  problem list, medical history, surgical history, family history, social history, and previous encounter notes.  I, Water quality scientist, CMA, am acting as Location manager for [employer], DO.  I have reviewed the above documentation for accuracy and completeness, and I agree with the above. Briscoe Deutscher, DO

## 2019-05-26 ENCOUNTER — Other Ambulatory Visit: Payer: Self-pay

## 2019-05-26 ENCOUNTER — Ambulatory Visit (INDEPENDENT_AMBULATORY_CARE_PROVIDER_SITE_OTHER): Payer: BC Managed Care – PPO | Admitting: Family Medicine

## 2019-05-26 VITALS — BP 135/85 | HR 75 | Temp 98.4°F | Ht 67.0 in | Wt 327.0 lb

## 2019-05-26 DIAGNOSIS — I10 Essential (primary) hypertension: Secondary | ICD-10-CM

## 2019-05-26 DIAGNOSIS — R7303 Prediabetes: Secondary | ICD-10-CM

## 2019-05-26 DIAGNOSIS — Z6841 Body Mass Index (BMI) 40.0 and over, adult: Secondary | ICD-10-CM

## 2019-05-26 DIAGNOSIS — E559 Vitamin D deficiency, unspecified: Secondary | ICD-10-CM

## 2019-05-26 DIAGNOSIS — Z9189 Other specified personal risk factors, not elsewhere classified: Secondary | ICD-10-CM | POA: Diagnosis not present

## 2019-05-26 MED ORDER — BD PEN NEEDLE NANO 2ND GEN 32G X 4 MM MISC: 1.0000 | Freq: Every day | 0 refills | Status: DC

## 2019-05-26 MED ORDER — SAXENDA 18 MG/3ML ~~LOC~~ SOPN: 3.0000 mg | PEN_INJECTOR | Freq: Every day | SUBCUTANEOUS | 0 refills | Status: DC

## 2019-05-26 NOTE — Progress Notes (Signed)
Chief Complaint:   OBESITY Katrina Carlson is here to discuss her progress with her obesity treatment plan along with follow-up of her obesity related diagnoses. Katrina Carlson is on the Category 3 Plan and states she is following her eating plan approximately 80% of the time. Katrina Carlson states she is walking for 30 minutes 3 times per week.  Today's visit was #: 3 Starting weight: 331 lbs Starting date: 04/29/2019 Today's weight: 327 lbs Today's date: 05/26/2019 Total lbs lost to date: 4 lbs Total lbs lost since last in-office visit: 2 lbs  Interim History: Katrina Carlson reports that she was fatigued last week.  She had her second COVID vaccine.   Subjective:   1. Essential hypertension Review: taking medications as instructed, no medication side effects noted, no chest pain on exertion, no dyspnea on exertion, no swelling of ankles.   BP Readings from Last 3 Encounters:  05/26/19 135/85  05/13/19 (!) 149/82  04/29/19 (!) 143/83   2. Vitamin D deficiency Katrina Carlson's Vitamin D level was 7.9 on 04/29/2019. She is currently taking prescription vitamin D 50,000 IU each week. She denies nausea, vomiting or muscle weakness.  3. Prediabetes Katrina Carlson has a diagnosis of prediabetes based on her elevated HgA1c and was informed this puts her at greater risk of developing diabetes. She continues to work on diet and exercise to decrease her risk of diabetes. She denies nausea or hypoglycemia.  Assessment/Plan:   1. Essential hypertension Katrina Carlson is working on healthy weight loss and exercise to improve blood pressure control. We will watch for signs of hypotension as she continues her lifestyle modifications.  2. Vitamin D deficiency Low Vitamin D level contributes to fatigue and are associated with obesity, breast, and colon cancer. She agrees to continue to take prescription Vitamin D @50 ,000 IU every week and will follow-up for routine testing of Vitamin D, at least 2-3 times per year to avoid  over-replacement.  3. Prediabetes Katrina Carlson will continue to work on weight loss, exercise, and decreasing simple carbohydrates to help decrease the risk of diabetes.   4. At risk for diabetes mellitus Katrina Carlson was given approximately 15 minutes of diabetes education and counseling today. We discussed intensive lifestyle modifications today with an emphasis on weight loss as well as increasing exercise and decreasing simple carbohydrates in her diet. We also reviewed medication options with an emphasis on risk versus benefit of those discussed.   Repetitive spaced learning was employed today to elicit superior memory formation and behavioral change.  5. Class 3 severe obesity with serious comorbidity and body mass index (BMI) of 50.0 to 59.9 in adult, unspecified obesity type (HCC)  Orders - Liraglutide -Weight Management (SAXENDA) 18 MG/3ML SOPN; Inject 0.5 mLs (3 mg total) into the skin daily.  Dispense: 5 pen; Refill: 0 - Insulin Pen Needle (BD PEN NEEDLE NANO 2ND GEN) 32G X 4 MM MISC; 1 Package by Does not apply route daily.  Dispense: 100 each; Refill: 0  Katrina Carlson is currently in the action stage of change. As such, her goal is to continue with weight loss efforts. She has agreed to the Category 3 Plan.   Exercise goals: For substantial health benefits, adults should do at least 150 minutes (2 hours and 30 minutes) a week of moderate-intensity, or 75 minutes (1 hour and 15 minutes) a week of vigorous-intensity aerobic physical activity, or an equivalent combination of moderate- and vigorous-intensity aerobic activity. Aerobic activity should be performed in episodes of at least 10 minutes, and preferably, it should be  spread throughout the week.  Behavioral modification strategies: increasing lean protein intake and increasing water intake.  Katrina Carlson has agreed to follow-up with our clinic in 2 weeks. She was informed of the importance of frequent follow-up visits to maximize her success with  intensive lifestyle modifications for her multiple health conditions.   Objective:   Blood pressure 135/85, pulse 75, temperature 98.4 F (36.9 C), temperature source Oral, height 5\' 7"  (1.702 m), weight (!) 327 lb (148.3 kg), SpO2 96 %. Body mass index is 51.22 kg/m.  General: Cooperative, alert, well developed, in no acute distress. HEENT: Conjunctivae and lids unremarkable. Cardiovascular: Regular rhythm.  Lungs: Normal work of breathing. Neurologic: No focal deficits.   Lab Results  Component Value Date   CREATININE 0.61 04/29/2019   BUN 8 04/29/2019   NA 139 04/29/2019   K 4.3 04/29/2019   CL 103 04/29/2019   CO2 22 04/29/2019   Lab Results  Component Value Date   ALT 15 04/29/2019   AST 18 04/29/2019   ALKPHOS 54 04/29/2019   BILITOT 0.7 04/29/2019   Lab Results  Component Value Date   TSH 1.190 04/29/2019   Lab Results  Component Value Date   CHOL 142 04/29/2019   HDL 41 04/29/2019   LDLCALC 81 04/29/2019   TRIG 109 04/29/2019   CHOLHDL 3.5 04/29/2019   Lab Results  Component Value Date   WBC 8.7 04/29/2019   HGB 14.2 04/29/2019   HCT 40.8 04/29/2019   MCV 91 04/29/2019   PLT 318 04/29/2019   Lab Results  Component Value Date   IRON 130 04/29/2019   TIBC 275 04/29/2019   FERRITIN 75 04/29/2019   Attestation Statements:   Reviewed by clinician on day of visit: allergies, medications, problem list, medical history, surgical history, family history, social history, and previous encounter notes.  I, Water quality scientist, CMA, am acting as Location manager for [employer], DO.  I have reviewed the above documentation for accuracy and completeness, and I agree with the above. Briscoe Deutscher, DO

## 2019-06-02 ENCOUNTER — Encounter (INDEPENDENT_AMBULATORY_CARE_PROVIDER_SITE_OTHER): Payer: Self-pay | Admitting: *Deleted

## 2019-06-18 ENCOUNTER — Encounter (INDEPENDENT_AMBULATORY_CARE_PROVIDER_SITE_OTHER): Payer: Self-pay | Admitting: Family Medicine

## 2019-06-18 ENCOUNTER — Ambulatory Visit (INDEPENDENT_AMBULATORY_CARE_PROVIDER_SITE_OTHER): Payer: BC Managed Care – PPO | Admitting: Family Medicine

## 2019-06-18 ENCOUNTER — Other Ambulatory Visit: Payer: Self-pay

## 2019-06-18 VITALS — BP 121/78 | HR 84 | Temp 98.7°F | Ht 67.0 in | Wt 327.0 lb

## 2019-06-18 DIAGNOSIS — E559 Vitamin D deficiency, unspecified: Secondary | ICD-10-CM

## 2019-06-18 DIAGNOSIS — Z9189 Other specified personal risk factors, not elsewhere classified: Secondary | ICD-10-CM | POA: Diagnosis not present

## 2019-06-18 DIAGNOSIS — I10 Essential (primary) hypertension: Secondary | ICD-10-CM | POA: Diagnosis not present

## 2019-06-18 DIAGNOSIS — Z6841 Body Mass Index (BMI) 40.0 and over, adult: Secondary | ICD-10-CM

## 2019-06-18 DIAGNOSIS — R7303 Prediabetes: Secondary | ICD-10-CM | POA: Diagnosis not present

## 2019-06-18 NOTE — Progress Notes (Signed)
Chief Complaint:   OBESITY Katrina Carlson is here to discuss her progress with her obesity treatment plan along with follow-up of her obesity related diagnoses. Katrina Carlson is on the Category 3 Plan and states she is following her eating plan approximately 50% of the time. Katrina Carlson states she is exercising for 0 minutes 0 times per week.  Today's visit was #: 4 Starting weight: 331 lbs Starting date: 04/29/2019 Today's weight: 327 lbs Today's date: 06/18/2019 Total lbs lost to date: 4 lbs Total lbs lost since last in-office visit: 0  Interim History: Katrina Carlson says she has been walking around her neighborhood.  Subjective:   1. Vitamin D deficiency Katrina Carlson's Vitamin D level was 7.9 on 04/29/2019. She is currently taking prescription vitamin D 50,000 IU each week. She denies nausea, vomiting or muscle weakness.  2. Prediabetes, with polyphagia Katrina Carlson has a diagnosis of prediabetes based on her elevated HgA1c and was informed this puts her at greater risk of developing diabetes. She continues to work on diet and exercise to decrease her risk of diabetes. She denies nausea or hypoglycemia.  She was prescribed Saxenda at last visit.  She tolerated it well.  She is still on a low dose.  3. Essential hypertension Review: taking medications as instructed, no medication side effects noted, no chest pain on exertion, no dyspnea on exertion, no swelling of ankles.  Blood pressure is at goal.  BP Readings from Last 3 Encounters:  06/18/19 121/78  05/26/19 135/85  05/13/19 (!) 149/82   4. At risk for diabetes mellitus Katrina Carlson is at higher than average risk for developing diabetes due to her obesity.   Assessment/Plan:   1. Vitamin D deficiency Low Vitamin D level contributes to fatigue and are associated with obesity, breast, and colon cancer. She agrees to continue to take prescription Vitamin D @50 ,000 IU every week and will follow-up for routine testing of Vitamin D, at least 2-3 times per year to  avoid over-replacement.  Jerrye saw her GYN last week and 2 months of refills were given.  2. Prediabetes Katrina Carlson will continue to work on weight loss, exercise, and decreasing simple carbohydrates to help decrease the risk of diabetes.  Katrina Carlson will increase Saxenda slowly as tolerated.  3. Essential hypertension Bentleigh is working on healthy weight loss and exercise to improve blood pressure control. We will watch for signs of hypotension as she continues her lifestyle modifications.  4. At risk for diabetes mellitus Katrina Carlson was given approximately 15 minutes of diabetes education and counseling today. We discussed intensive lifestyle modifications today with an emphasis on weight loss as well as increasing exercise and decreasing simple carbohydrates in her diet. We also reviewed medication options with an emphasis on risk versus benefit of those discussed.   Repetitive spaced learning was employed today to elicit superior memory formation and behavioral change.  5. Class 3 severe obesity with serious comorbidity and body mass index (BMI) of 50.0 to 59.9 in adult, unspecified obesity type (HCC) Katrina Carlson is currently in the action stage of change. As such, her goal is to continue with weight loss efforts. She has agreed to the Category 3 Plan.   Exercise goals: Start thinking about a fun routine.  Behavioral modification strategies: increasing lean protein intake and increasing water intake.  Katrina Carlson has agreed to follow-up with our clinic in 3 weeks. She was informed of the importance of frequent follow-up visits to maximize her success with intensive lifestyle modifications for her multiple health conditions.   Objective:  Blood pressure 121/78, pulse 84, temperature 98.7 F (37.1 C), temperature source Oral, height 5\' 7"  (1.702 m), weight (!) 327 lb (148.3 kg), SpO2 96 %. Body mass index is 51.22 kg/m.  General: Cooperative, alert, well developed, in no acute distress. HEENT:  Conjunctivae and lids unremarkable. Cardiovascular: Regular rhythm.  Lungs: Normal work of breathing. Neurologic: No focal deficits.   Lab Results  Component Value Date   CREATININE 0.61 04/29/2019   BUN 8 04/29/2019   NA 139 04/29/2019   K 4.3 04/29/2019   CL 103 04/29/2019   CO2 22 04/29/2019   Lab Results  Component Value Date   ALT 15 04/29/2019   AST 18 04/29/2019   ALKPHOS 54 04/29/2019   BILITOT 0.7 04/29/2019   Lab Results  Component Value Date   TSH 1.190 04/29/2019   Lab Results  Component Value Date   CHOL 142 04/29/2019   HDL 41 04/29/2019   LDLCALC 81 04/29/2019   TRIG 109 04/29/2019   CHOLHDL 3.5 04/29/2019   Lab Results  Component Value Date   WBC 8.7 04/29/2019   HGB 14.2 04/29/2019   HCT 40.8 04/29/2019   MCV 91 04/29/2019   PLT 318 04/29/2019   Lab Results  Component Value Date   IRON 130 04/29/2019   TIBC 275 04/29/2019   FERRITIN 75 04/29/2019   Attestation Statements:   Reviewed by clinician on day of visit: allergies, medications, problem list, medical history, surgical history, family history, social history, and previous encounter notes.  I, Water quality scientist, CMA, am acting as Location manager for [employer], DO.  I have reviewed the above documentation for accuracy and completeness, and I agree with the above. Briscoe Deutscher, DO

## 2019-07-09 ENCOUNTER — Other Ambulatory Visit: Payer: Self-pay

## 2019-07-09 ENCOUNTER — Ambulatory Visit (INDEPENDENT_AMBULATORY_CARE_PROVIDER_SITE_OTHER): Payer: BC Managed Care – PPO | Admitting: Physician Assistant

## 2019-07-09 ENCOUNTER — Encounter (INDEPENDENT_AMBULATORY_CARE_PROVIDER_SITE_OTHER): Payer: Self-pay | Admitting: Physician Assistant

## 2019-07-09 VITALS — BP 137/81 | HR 68 | Temp 98.9°F | Ht 67.0 in | Wt 326.0 lb

## 2019-07-09 DIAGNOSIS — E559 Vitamin D deficiency, unspecified: Secondary | ICD-10-CM

## 2019-07-09 DIAGNOSIS — Z6841 Body Mass Index (BMI) 40.0 and over, adult: Secondary | ICD-10-CM | POA: Diagnosis not present

## 2019-07-09 NOTE — Progress Notes (Signed)
Chief Complaint:   OBESITY Katrina Carlson is here to discuss her progress with her obesity treatment plan along with follow-up of her obesity related diagnoses. Katrina Carlson is on the Category 3 Plan and states she is following her eating plan approximately 75% of the time. Katrina Carlson states she is walking for 30 minutes 2 times per week.  Today's visit was #: 5 Starting weight: 331 lbs Starting date: 04/29/2019 Today's weight: 326 lbs Today's date: 07/09/2019 Total lbs lost to date: 5 lbs Total lbs lost since last in-office visit: 1 lb  Interim History: Katrina Carlson states that she is typically not hungry until noon, so she sometimes skips breakfast.  She is on Saxenda 1.2 mg daily.  Subjective:   1. Vitamin D deficiency Katrina Carlson's Vitamin D level was 7.9 on 04/29/2019. She is currently taking prescription vitamin D 50,000 IU each week. She denies nausea, vomiting or muscle weakness.  Assessment/Plan:   1. Vitamin D deficiency Low Vitamin D level contributes to fatigue and are associated with obesity, breast, and colon cancer. She agrees to continue to take prescription Vitamin D @50 ,000 IU every week and will follow-up for routine testing of Vitamin D, at least 2-3 times per year to avoid over-replacement.  2. Class 3 severe obesity with serious comorbidity and body mass index (BMI) of 50.0 to 59.9 in adult, unspecified obesity type (HCC) Katrina Carlson is currently in the action stage of change. As such, her goal is to continue with weight loss efforts. She has agreed to the Category 3 Plan.   Exercise goals: For substantial health benefits, adults should do at least 150 minutes (2 hours and 30 minutes) a week of moderate-intensity, or 75 minutes (1 hour and 15 minutes) a week of vigorous-intensity aerobic physical activity, or an equivalent combination of moderate- and vigorous-intensity aerobic activity. Aerobic activity should be performed in episodes of at least 10 minutes, and preferably, it should be  spread throughout the week.  Behavioral modification strategies: meal planning and cooking strategies and keeping healthy foods in the home.  Katrina Carlson has agreed to follow-up with our clinic in 2 weeks. She was informed of the importance of frequent follow-up visits to maximize her success with intensive lifestyle modifications for her multiple health conditions.   Objective:   Blood pressure 137/81, pulse 68, temperature 98.9 F (37.2 C), temperature source Oral, height 5\' 7"  (1.702 m), weight (!) 326 lb (147.9 kg), SpO2 98 %. Body mass index is 51.06 kg/m.  General: Cooperative, alert, well developed, in no acute distress. HEENT: Conjunctivae and lids unremarkable. Cardiovascular: Regular rhythm.  Lungs: Normal work of breathing. Neurologic: No focal deficits.   Lab Results  Component Value Date   CREATININE 0.61 04/29/2019   BUN 8 04/29/2019   NA 139 04/29/2019   K 4.3 04/29/2019   CL 103 04/29/2019   CO2 22 04/29/2019   Lab Results  Component Value Date   ALT 15 04/29/2019   AST 18 04/29/2019   ALKPHOS 54 04/29/2019   BILITOT 0.7 04/29/2019   Lab Results  Component Value Date   TSH 1.190 04/29/2019   Lab Results  Component Value Date   CHOL 142 04/29/2019   HDL 41 04/29/2019   LDLCALC 81 04/29/2019   TRIG 109 04/29/2019   CHOLHDL 3.5 04/29/2019   Lab Results  Component Value Date   WBC 8.7 04/29/2019   HGB 14.2 04/29/2019   HCT 40.8 04/29/2019   MCV 91 04/29/2019   PLT 318 04/29/2019   Lab Results  Component Value Date   IRON 130 04/29/2019   TIBC 275 04/29/2019   FERRITIN 75 04/29/2019   Attestation Statements:   Reviewed by clinician on day of visit: allergies, medications, problem list, medical history, surgical history, family history, social history, and previous encounter notes.  Time spent on visit including pre-visit chart review and post-visit care and charting was 30 minutes.   I, Water quality scientist, CMA, am acting as Location manager for  [employer], PA-C.  I have reviewed the above documentation for accuracy and completeness, and I agree with the above. Abby Potash, PA-C

## 2019-07-30 ENCOUNTER — Ambulatory Visit (INDEPENDENT_AMBULATORY_CARE_PROVIDER_SITE_OTHER): Payer: BC Managed Care – PPO | Admitting: Physician Assistant

## 2019-07-30 ENCOUNTER — Other Ambulatory Visit: Payer: Self-pay

## 2019-07-30 ENCOUNTER — Encounter (INDEPENDENT_AMBULATORY_CARE_PROVIDER_SITE_OTHER): Payer: Self-pay | Admitting: Physician Assistant

## 2019-07-30 VITALS — BP 132/81 | HR 86 | Temp 98.7°F | Ht 67.0 in | Wt 321.0 lb

## 2019-07-30 DIAGNOSIS — Z6841 Body Mass Index (BMI) 40.0 and over, adult: Secondary | ICD-10-CM | POA: Diagnosis not present

## 2019-07-30 DIAGNOSIS — I10 Essential (primary) hypertension: Secondary | ICD-10-CM

## 2019-07-30 NOTE — Progress Notes (Signed)
Chief Complaint:   OBESITY Katrina Carlson is here to discuss her progress with her obesity treatment plan along with follow-up of her obesity related diagnoses. Katrina Carlson is on the Category 3 Plan and states she is following her eating plan approximately 75% of the time. [name] states she is walking 30 minutes 3 times per week.  Today's visit was #: 6 Starting weight: 331 lbs Starting date: 04/29/2019 Today's weight: 321 lbs Today's date: 07/30/2019 Total lbs lost to date: 10 Total lbs lost since last in-office visit: 5  Interim History: Katrina Carlson reports that she did not skip breakfast as much. She is eating multiple small meals a day rather than 3 big meals. She has not yet tried the protein yogurt or protein waffles.  Subjective:   Essential hypertension. Blood pressure is normal and controlled. Katrina Carlson is on no medication.  BP Readings from Last 3 Encounters:  07/30/19 132/81  07/09/19 137/81  06/18/19 121/78   Lab Results  Component Value Date   CREATININE 0.61 04/29/2019   CREATININE 0.66 12/02/2018   CREATININE 0.68 12/01/2018   Assessment/Plan:   Essential hypertension. [name] is working on healthy weight loss and exercise to improve blood pressure control. We will watch for signs of hypotension as she continues her lifestyle modifications.  Class 3 severe obesity with serious comorbidity and body mass index (BMI) of 50.0 to 59.9 in adult, unspecified obesity type (HCC).  Katrina Carlson is currently in the action stage of change. As such, her goal is to continue with weight loss efforts. She has agreed to the Category 3 Plan.   Exercise goals: For substantial health benefits, adults should do at least 150 minutes (2 hours and 30 minutes) a week of moderate-intensity, or 75 minutes (1 hour and 15 minutes) a week of vigorous-intensity aerobic physical activity, or an equivalent combination of moderate- and vigorous-intensity aerobic activity. Aerobic activity should be  performed in episodes of at least 10 minutes, and preferably, it should be spread throughout the week.  Behavioral modification strategies: meal planning and cooking strategies and planning for success.  Katrina Carlson has agreed to follow-up with our clinic in 3 weeks. She was informed of the importance of frequent follow-up visits to maximize her success with intensive lifestyle modifications for her multiple health conditions.   Objective:   Blood pressure 132/81, pulse 86, temperature 98.7 F (37.1 C), temperature source Oral, height 5\' 7"  (1.702 m), weight (!) 321 lb (145.6 kg), SpO2 96 %. Body mass index is 50.28 kg/m.  General: Cooperative, alert, well developed, in no acute distress. HEENT: Conjunctivae and lids unremarkable. Cardiovascular: Regular rhythm.  Lungs: Normal work of breathing. Neurologic: No focal deficits.   Lab Results  Component Value Date   CREATININE 0.61 04/29/2019   BUN 8 04/29/2019   NA 139 04/29/2019   K 4.3 04/29/2019   CL 103 04/29/2019   CO2 22 04/29/2019   Lab Results  Component Value Date   ALT 15 04/29/2019   AST 18 04/29/2019   ALKPHOS 54 04/29/2019   BILITOT 0.7 04/29/2019   No results found for: HGBA1C No results found for: INSULIN Lab Results  Component Value Date   TSH 1.190 04/29/2019   Lab Results  Component Value Date   CHOL 142 04/29/2019   HDL 41 04/29/2019   LDLCALC 81 04/29/2019   TRIG 109 04/29/2019   CHOLHDL 3.5 04/29/2019   Lab Results  Component Value Date   WBC 8.7 04/29/2019   HGB 14.2 04/29/2019  HCT 40.8 04/29/2019   MCV 91 04/29/2019   PLT 318 04/29/2019   Lab Results  Component Value Date   IRON 130 04/29/2019   TIBC 275 04/29/2019   FERRITIN 75 04/29/2019   Attestation Statements:   Reviewed by clinician on day of visit: allergies, medications, problem list, medical history, surgical history, family history, social history, and previous encounter notes.  Time spent on visit including pre-visit  chart review and post-visit charting and care was 31 minutes.   IMichaelene Song, am acting as transcriptionist for Abby Potash, PA-C   I have reviewed the above documentation for accuracy and completeness, and I agree with the above. Abby Potash, PA-C

## 2019-08-05 IMAGING — DX DG KNEE COMPLETE 4+V*R*
5 series · 5 of 5 positions shown · non-contrast
Comparison: None.

CLINICAL DATA: Right knee pain after fall today.

EXAM:
RIGHT KNEE - COMPLETE 4+ VIEW

[knee ap]
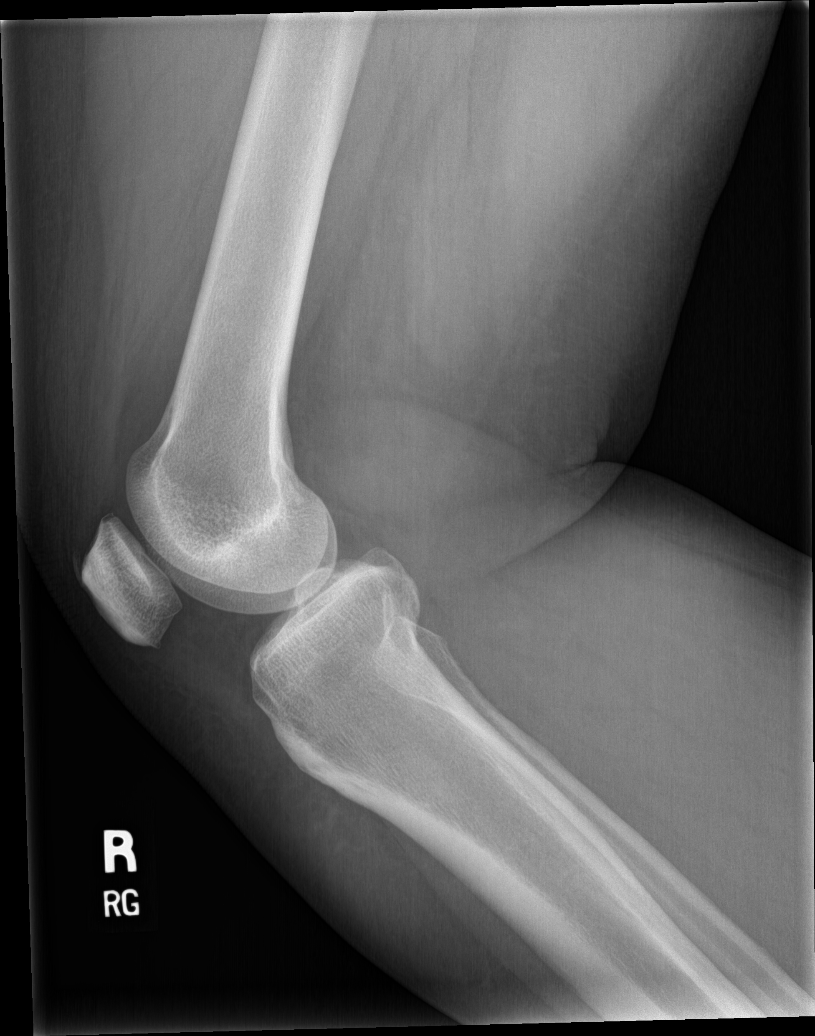

[knee obl (1 of 2)]
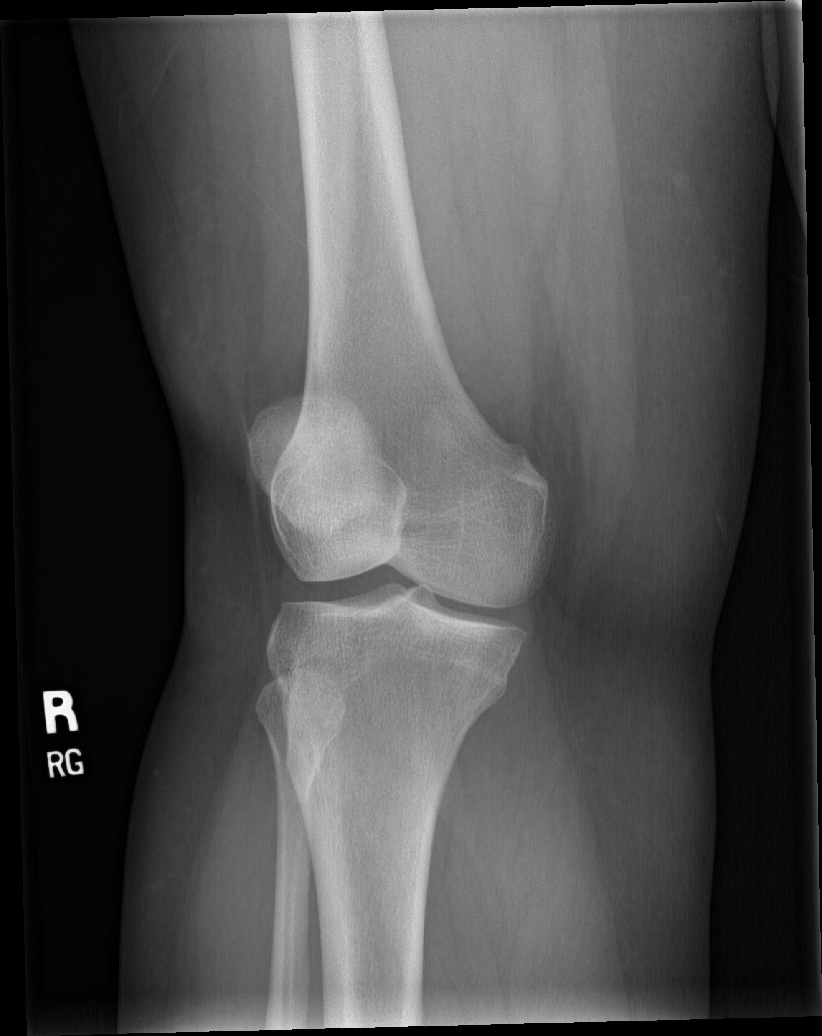

[knee obl (2 of 2)]
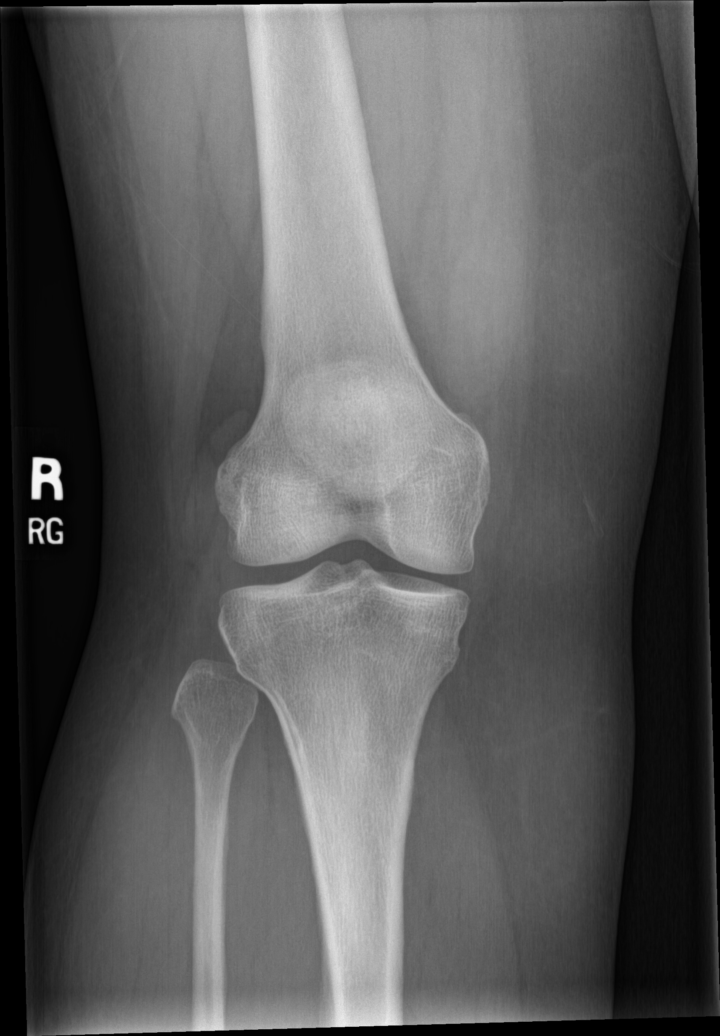

[knee lat]
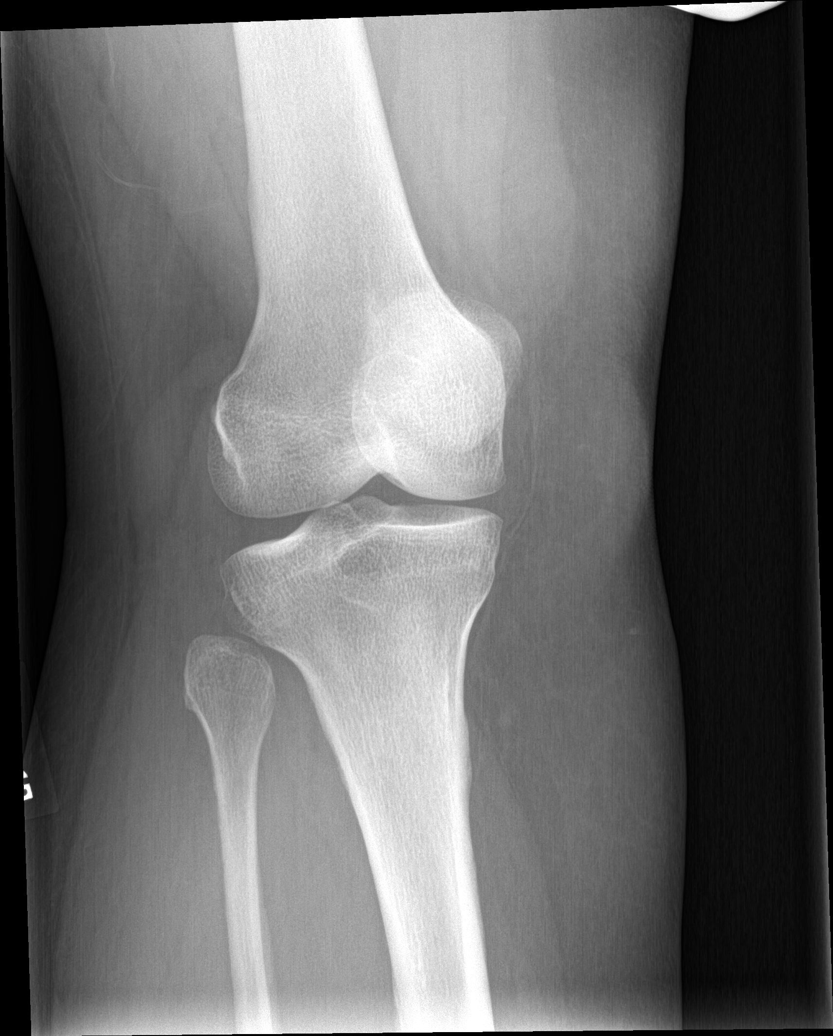

[sunrise]
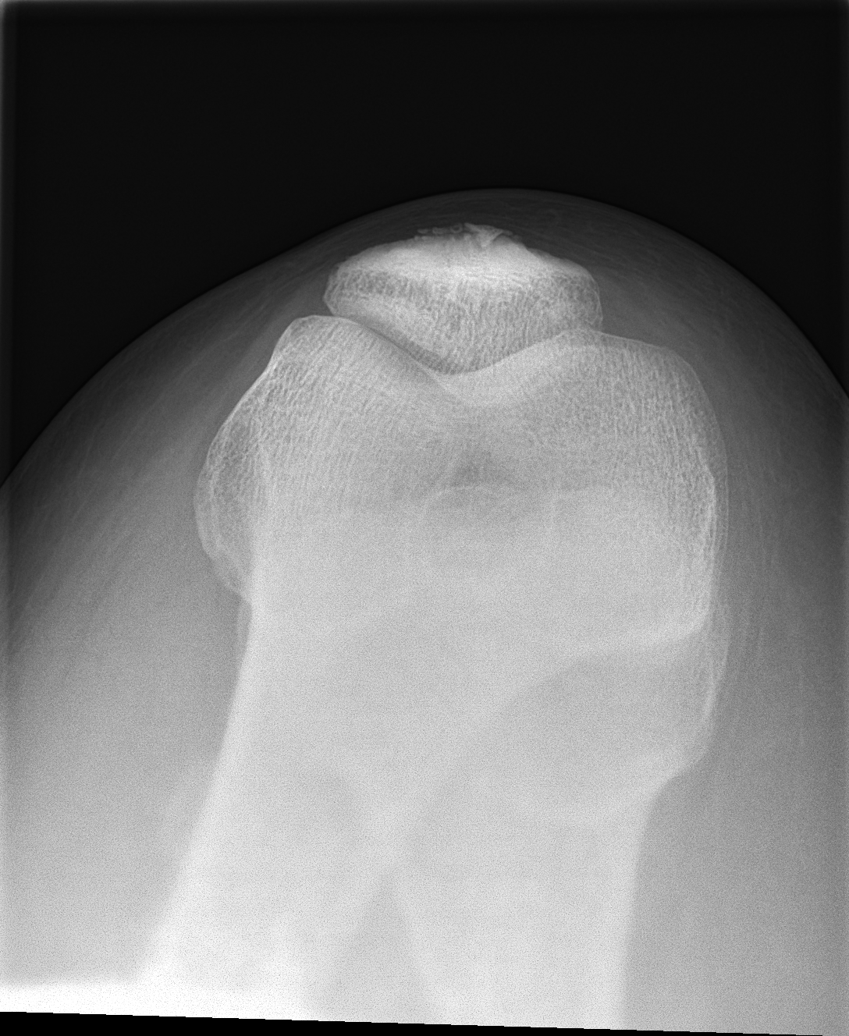

[5 of 5 positions shown; findings below may reference images not displayed]

FINDINGS: No evidence of fracture, dislocation, or joint effusion. No evidence
of arthropathy or other focal bone abnormality. Soft tissues are
unremarkable.
IMPRESSION: Normal right knee.

## 2019-08-20 ENCOUNTER — Other Ambulatory Visit: Payer: Self-pay

## 2019-08-20 ENCOUNTER — Encounter (INDEPENDENT_AMBULATORY_CARE_PROVIDER_SITE_OTHER): Payer: Self-pay | Admitting: Physician Assistant

## 2019-08-20 ENCOUNTER — Ambulatory Visit (INDEPENDENT_AMBULATORY_CARE_PROVIDER_SITE_OTHER): Payer: BC Managed Care – PPO | Admitting: Physician Assistant

## 2019-08-20 VITALS — BP 119/82 | HR 96 | Temp 98.7°F | Ht 67.0 in | Wt 316.0 lb

## 2019-08-20 DIAGNOSIS — Z9189 Other specified personal risk factors, not elsewhere classified: Secondary | ICD-10-CM | POA: Diagnosis not present

## 2019-08-20 DIAGNOSIS — R7303 Prediabetes: Secondary | ICD-10-CM | POA: Diagnosis not present

## 2019-08-20 DIAGNOSIS — Z6841 Body Mass Index (BMI) 40.0 and over, adult: Secondary | ICD-10-CM

## 2019-08-20 DIAGNOSIS — E559 Vitamin D deficiency, unspecified: Secondary | ICD-10-CM

## 2019-08-20 MED ORDER — SAXENDA 18 MG/3ML ~~LOC~~ SOPN: 3.0000 mg | PEN_INJECTOR | Freq: Every day | SUBCUTANEOUS | 0 refills | Status: DC

## 2019-08-20 NOTE — Progress Notes (Signed)
Chief Complaint:   OBESITY Katrina Carlson is here to discuss her progress with her obesity treatment plan along with follow-up of her obesity related diagnoses. Katrina Carlson is on the Category 4 Plan and states she is following her eating plan approximately 70% of the time. Katrina Carlson states she is boxing 60 minutes 3 times per week.  Today's visit was #: 7 Starting weight: 331 lbs Starting date: 04/29/2019 Today's weight: 316 lbs Today's date: 08/20/2019 Total lbs lost to date: 15 Total lbs lost since last in-office visit: 6  Interim History: Katrina Carlson is following the Category 4 meal plan and is also journaling. She is doing a better job with meal prep. She is physically going back to her office to work in 3 weeks. She is on Saxenda 2.4 mg and feels it controls her appetite.  Subjective:   Vitamin D deficiency. Katrina Carlson is on Vitamin D supplementation. No nausea, vomiting, or muscle weakness. Last level was not at goal - 7.9 on 04/29/2019.   Ref. Range 04/29/2019 12:54  Vitamin D, 25-Hydroxy Latest Ref Range: 30.0 - 100.0 ng/mL 7.9 (L)   Prediabetes. Katrina Carlson has a diagnosis of prediabetes based on her elevated HgA1c and was informed this puts her at greater risk of developing diabetes. She continues to work on diet and exercise to decrease her risk of diabetes. She denies nausea or hypoglycemia. Katrina Carlson is on Saxenda. No nausea, vomiting, or diarrhea. She is exercising regularly. Is due for labs.  No results found for: HGBA1C No results found for: INSULIN  At risk for diabetes mellitus. Katrina Carlson is at higher than average risk for developing diabetes due to her obesity.   Assessment/Plan:   Vitamin D deficiency. Low Vitamin D level contributes to fatigue and are associated with obesity, breast, and colon cancer. She will follow-up for routine testing of Vitamin D at her next office visit.  Prediabetes. Katrina Carlson will continue to work on weight loss, exercise, and decreasing simple  carbohydrates to help decrease the risk of diabetes. She will continue Saxenda as directed.  At risk for diabetes mellitus. Katrina Carlson was given approximately 15 minutes of diabetes education and counseling today. We discussed intensive lifestyle modifications today with an emphasis on weight loss as well as increasing exercise and decreasing simple carbohydrates in her diet. We also reviewed medication options with an emphasis on risk versus benefit of those discussed.   Repetitive spaced learning was employed today to elicit superior memory formation and behavioral change.  Class 3 severe obesity with serious comorbidity and body mass index (BMI) of 45.0 to 49.9 in adult, unspecified obesity type (Katrina Carlson). Refill was given for Liraglutide - Weight Management (SAXENDA) 18 MG/3ML SOPN #5 with 0 refills.  Katrina Carlson is currently in the action stage of change. As such, her goal is to continue with weight loss efforts. She has agreed to keeping a food journal and adhering to recommended goals of 1800 calories and 115 grams of protein daily.   Exercise goals: For substantial health benefits, adults should do at least 150 minutes (2 hours and 30 minutes) a week of moderate-intensity, or 75 minutes (1 hour and 15 minutes) a week of vigorous-intensity aerobic physical activity, or an equivalent combination of moderate- and vigorous-intensity aerobic activity. Aerobic activity should be performed in episodes of at least 10 minutes, and preferably, it should be spread throughout the week.  Behavioral modification strategies: meal planning and cooking strategies and keeping healthy foods in the home.  Katrina Carlson has agreed to follow-up with  our clinic in 3 weeks. She was informed of the importance of frequent follow-up visits to maximize her success with intensive lifestyle modifications for her multiple health conditions.   Objective:   Blood pressure 119/82, pulse 96, temperature 98.7 F (37.1 C), temperature source  Oral, height 5\' 7"  (1.702 m), weight (!) 316 lb (143.3 kg), SpO2 97 %. Body mass index is 49.49 kg/m.  General: Cooperative, alert, well developed, in no acute distress. HEENT: Conjunctivae and lids unremarkable. Cardiovascular: Regular rhythm.  Lungs: Normal work of breathing. Neurologic: No focal deficits.   Lab Results  Component Value Date   CREATININE 0.61 04/29/2019   BUN 8 04/29/2019   NA 139 04/29/2019   K 4.3 04/29/2019   CL 103 04/29/2019   CO2 22 04/29/2019   Lab Results  Component Value Date   ALT 15 04/29/2019   AST 18 04/29/2019   ALKPHOS 54 04/29/2019   BILITOT 0.7 04/29/2019   No results found for: HGBA1C No results found for: INSULIN Lab Results  Component Value Date   TSH 1.190 04/29/2019   Lab Results  Component Value Date   CHOL 142 04/29/2019   HDL 41 04/29/2019   LDLCALC 81 04/29/2019   TRIG 109 04/29/2019   CHOLHDL 3.5 04/29/2019   Lab Results  Component Value Date   WBC 8.7 04/29/2019   HGB 14.2 04/29/2019   HCT 40.8 04/29/2019   MCV 91 04/29/2019   PLT 318 04/29/2019   Lab Results  Component Value Date   IRON 130 04/29/2019   TIBC 275 04/29/2019   FERRITIN 75 04/29/2019   Attestation Statements:   Reviewed by clinician on day of visit: allergies, medications, problem list, medical history, surgical history, family history, social history, and previous encounter notes.  I05/10/2019, am acting as transcriptionist for Marianna Payment, PA-C   I have reviewed the above documentation for accuracy and completeness, and I agree with the above. Alois Cliche, PA-C

## 2019-09-23 ENCOUNTER — Other Ambulatory Visit: Payer: Self-pay

## 2019-09-23 ENCOUNTER — Ambulatory Visit (INDEPENDENT_AMBULATORY_CARE_PROVIDER_SITE_OTHER): Payer: BC Managed Care – PPO | Admitting: Physician Assistant

## 2019-09-23 ENCOUNTER — Encounter (INDEPENDENT_AMBULATORY_CARE_PROVIDER_SITE_OTHER): Payer: Self-pay | Admitting: Physician Assistant

## 2019-09-23 DIAGNOSIS — Z6841 Body Mass Index (BMI) 40.0 and over, adult: Secondary | ICD-10-CM

## 2019-09-23 DIAGNOSIS — E559 Vitamin D deficiency, unspecified: Secondary | ICD-10-CM | POA: Diagnosis not present

## 2019-09-23 DIAGNOSIS — R7303 Prediabetes: Secondary | ICD-10-CM | POA: Diagnosis not present

## 2019-09-23 DIAGNOSIS — Z9189 Other specified personal risk factors, not elsewhere classified: Secondary | ICD-10-CM

## 2019-09-23 MED ORDER — VITAMIN D (ERGOCALCIFEROL) 1.25 MG (50000 UNIT) PO CAPS: 50000.0000 [IU] | ORAL_CAPSULE | ORAL | 0 refills | Status: DC

## 2019-09-23 NOTE — Progress Notes (Signed)
Chief Complaint:   OBESITY CHAVON LUCARELLI is here to discuss her progress with her obesity treatment plan along with follow-up of her obesity related diagnoses. Seville is on the Category 4 Plan and states she is following her eating plan approximately 85% of the time. Melannie states she is doing HIIT/boxing 60-90 minutes 5 times per week.  Today's visit was #: 8 Starting weight: 331 lbs Starting date: 04/29/2019 Today's weight: 314 lbs Today's date: 09/23/2019 Total lbs lost to date: 17 Total lbs lost since last in-office visit: 2  Interim History: Altheia states that she stopped Saxenda on her own because she was worried that she would become dependent. She has since restarted and has some nausea associated with it.  Subjective:   Vitamin D deficiency. Lashawnta is on prescription Vitamin D weekly. No nausea, vomiting, or muscle weakness.    Ref. Range 04/29/2019 12:54  Vitamin D, 25-Hydroxy Latest Ref Range: 30.0 - 100.0 ng/mL 7.9 (L)   Prediabetes. Ailah has a diagnosis of prediabetes based on her elevated HgA1c and was informed this puts her at greater risk of developing diabetes. She continues to work on diet and exercise to decrease her risk of diabetes. She denies nausea or hypoglycemia. Dasia is on Saxenda, which is controlling her appetite well.  No results found for: HGBA1C No results found for: INSULIN  At risk for diabetes mellitus. Earlyn is at higher than average risk for developing diabetes due to her obesity.   Assessment/Plan:   Vitamin D deficiency. Low Vitamin D level contributes to fatigue and are associated with obesity, breast, and colon cancer. She was given a refill for Vitamin D, Ergocalciferol, (DRISDOL) 1.25 MG (50000 UNIT) CAPS capsule every week #4 with 0 refills and VITAMIN D 25 Hydroxy (Vit-D Deficiency, Fractures) level will be checked today.  Prediabetes. Xitlalli will continue to work on weight loss, exercise, and decreasing simple  carbohydrates to help decrease the risk of diabetes. She will continue her medication as directed. Comprehensive metabolic panel, Hemoglobin A1c, Insulin, random labs will be checked today.  At risk for diabetes mellitus. Jaedynn was given approximately 15 minutes of diabetes education and counseling today. We discussed intensive lifestyle modifications today with an emphasis on weight loss as well as increasing exercise and decreasing simple carbohydrates in her diet. We also reviewed medication options with an emphasis on risk versus benefit of those discussed.   Repetitive spaced learning was employed today to elicit superior memory formation and behavioral change.  Class 3 severe obesity with serious comorbidity and body mass index (BMI) of 45.0 to 49.9 in adult, unspecified obesity type (HCC).  Greyson is currently in the action stage of change. As such, her goal is to continue with weight loss efforts. She has agreed to the Category 4 Plan and will journal 1800 calories and 115 grams of protein daily.   Exercise goals: For substantial health benefits, adults should do at least 150 minutes (2 hours and 30 minutes) a week of moderate-intensity, or 75 minutes (1 hour and 15 minutes) a week of vigorous-intensity aerobic physical activity, or an equivalent combination of moderate- and vigorous-intensity aerobic activity. Aerobic activity should be performed in episodes of at least 10 minutes, and preferably, it should be spread throughout the week.  Behavioral modification strategies: increasing lean protein intake and meal planning and cooking strategies.  Shirlie has agreed to follow-up with our clinic in 2 weeks. She was informed of the importance of frequent follow-up visits to maximize her  success with intensive lifestyle modifications for her multiple health conditions.   Alyna was informed we would discuss her lab results at her next visit unless there is a critical issue that needs to be  addressed sooner. Vincenza agreed to keep her next visit at the agreed upon time to discuss these results.  Objective:   There were no vitals taken for this visit. There is no height or weight on file to calculate BMI.  General: Cooperative, alert, well developed, in no acute distress. HEENT: Conjunctivae and lids unremarkable. Cardiovascular: Regular rhythm.  Lungs: Normal work of breathing. Neurologic: No focal deficits.   Lab Results  Component Value Date   CREATININE 0.61 04/29/2019   BUN 8 04/29/2019   NA 139 04/29/2019   K 4.3 04/29/2019   CL 103 04/29/2019   CO2 22 04/29/2019   Lab Results  Component Value Date   ALT 15 04/29/2019   AST 18 04/29/2019   ALKPHOS 54 04/29/2019   BILITOT 0.7 04/29/2019   No results found for: HGBA1C No results found for: INSULIN Lab Results  Component Value Date   TSH 1.190 04/29/2019   Lab Results  Component Value Date   CHOL 142 04/29/2019   HDL 41 04/29/2019   LDLCALC 81 04/29/2019   TRIG 109 04/29/2019   CHOLHDL 3.5 04/29/2019   Lab Results  Component Value Date   WBC 8.7 04/29/2019   HGB 14.2 04/29/2019   HCT 40.8 04/29/2019   MCV 91 04/29/2019   PLT 318 04/29/2019   Lab Results  Component Value Date   IRON 130 04/29/2019   TIBC 275 04/29/2019   FERRITIN 75 04/29/2019   Attestation Statements:   Reviewed by clinician on day of visit: allergies, medications, problem list, medical history, surgical history, family history, social history, and previous encounter notes.  IMarianna Payment, am acting as transcriptionist for Alois Cliche, PA-C   I have reviewed the above documentation for accuracy and completeness, and I agree with the above. Alois Cliche, PA-C

## 2019-09-24 LAB — COMPREHENSIVE METABOLIC PANEL
ALT: 17 IU/L (ref 0–32)
AST: 16 IU/L (ref 0–40)
Albumin/Globulin Ratio: 1.3 (ref 1.2–2.2)
Albumin: 3.8 g/dL (ref 3.8–4.8)
Alkaline Phosphatase: 56 IU/L (ref 48–121)
BUN/Creatinine Ratio: 13 (ref 9–23)
BUN: 8 mg/dL (ref 6–20)
Bilirubin Total: 0.7 mg/dL (ref 0.0–1.2)
CO2: 23 mmol/L (ref 20–29)
Calcium: 9 mg/dL (ref 8.7–10.2)
Chloride: 102 mmol/L (ref 96–106)
Creatinine, Ser: 0.6 mg/dL (ref 0.57–1.00)
GFR calc Af Amer: 138 mL/min/{1.73_m2} (ref >59)
GFR calc non Af Amer: 119 mL/min/{1.73_m2} (ref >59)
Globulin, Total: 2.9 g/dL (ref 1.5–4.5)
Glucose: 91 mg/dL (ref 65–99)
Potassium: 3.9 mmol/L (ref 3.5–5.2)
Sodium: 137 mmol/L (ref 134–144)
Total Protein: 6.7 g/dL (ref 6.0–8.5)

## 2019-09-24 LAB — HEMOGLOBIN A1C
Est. average glucose Bld gHb Est-mCnc: 114 mg/dL
Hgb A1c MFr Bld: 5.6 % (ref 4.8–5.6)

## 2019-09-24 LAB — VITAMIN D 25 HYDROXY (VIT D DEFICIENCY, FRACTURES): Vit D, 25-Hydroxy: 23.1 ng/mL — ABNORMAL LOW (ref 30.0–100.0)

## 2019-09-24 LAB — INSULIN, RANDOM: INSULIN: 166 u[IU]/mL — ABNORMAL HIGH (ref 2.6–24.9)

## 2019-10-01 ENCOUNTER — Encounter (INDEPENDENT_AMBULATORY_CARE_PROVIDER_SITE_OTHER): Payer: Self-pay

## 2019-10-14 ENCOUNTER — Ambulatory Visit (INDEPENDENT_AMBULATORY_CARE_PROVIDER_SITE_OTHER): Payer: BC Managed Care – PPO | Admitting: Physician Assistant

## 2019-10-17 DIAGNOSIS — A6000 Herpesviral infection of urogenital system, unspecified: Secondary | ICD-10-CM | POA: Insufficient documentation

## 2019-11-04 ENCOUNTER — Ambulatory Visit (INDEPENDENT_AMBULATORY_CARE_PROVIDER_SITE_OTHER): Payer: BC Managed Care – PPO | Admitting: Physician Assistant

## 2019-11-13 ENCOUNTER — Ambulatory Visit (INDEPENDENT_AMBULATORY_CARE_PROVIDER_SITE_OTHER): Payer: BC Managed Care – PPO | Admitting: Family Medicine

## 2019-11-27 ENCOUNTER — Encounter (INDEPENDENT_AMBULATORY_CARE_PROVIDER_SITE_OTHER): Payer: Self-pay | Admitting: Family Medicine

## 2019-11-27 ENCOUNTER — Ambulatory Visit (INDEPENDENT_AMBULATORY_CARE_PROVIDER_SITE_OTHER): Payer: BC Managed Care – PPO | Admitting: Family Medicine

## 2019-11-27 ENCOUNTER — Other Ambulatory Visit: Payer: Self-pay

## 2019-11-27 VITALS — Temp 98.1°F | Ht 67.0 in | Wt 304.0 lb

## 2019-11-27 DIAGNOSIS — Z9189 Other specified personal risk factors, not elsewhere classified: Secondary | ICD-10-CM | POA: Diagnosis not present

## 2019-11-27 DIAGNOSIS — Z6841 Body Mass Index (BMI) 40.0 and over, adult: Secondary | ICD-10-CM | POA: Diagnosis not present

## 2019-11-27 DIAGNOSIS — E559 Vitamin D deficiency, unspecified: Secondary | ICD-10-CM | POA: Diagnosis not present

## 2019-11-27 DIAGNOSIS — R7303 Prediabetes: Secondary | ICD-10-CM | POA: Diagnosis not present

## 2019-11-27 MED ORDER — VITAMIN D (ERGOCALCIFEROL) 1.25 MG (50000 UNIT) PO CAPS: 50000.0000 [IU] | ORAL_CAPSULE | ORAL | 0 refills | Status: DC

## 2019-12-01 NOTE — Progress Notes (Signed)
Chief Complaint:   OBESITY Katrina Carlson is here to discuss her progress with her obesity treatment plan along with follow-up of her obesity related diagnoses. Katrina Carlson is on the Category 3 Plan and states she is following her eating plan approximately 80% of the time. Katrina Carlson states she is exercising for 0 minutes 0 times per week.  Today's visit was #: 9 Starting weight: 331 lbs Starting date: 04/29/2019 Today's weight: 304 lbs Today's date: 11/27/2019 Total lbs lost to date: 27 lbs Total lbs lost since last in-office visit: 10 lbs Total weight loss percentage to date: -8.16%  Interim History:   Hunger is controlled: yes. Current weight loss medications: Buffey is on Saxenda 1.8 mg subcu daily.  She has a Nexplanon in place for contraception. Sleep: Sleep is restful: yes Stress: Stress level change: not significantly changed.  Assessment/Plan:   1. Vitamin D deficiency Current vitamin D is 23.1, tested on 09/23/2019. Not at goal. Optimal goal > 50 ng/dL. There is also evidence to support a goal of >70 ng/dL in patients with cancer and heart disease.   Plan: Continue Vitamin D @50 ,000 IU every week with follow-up for routine testing of Vitamin D at least 2-3 times per year to avoid over-replacement.  -Refill Vitamin D, Ergocalciferol, (DRISDOL) 1.25 MG (50000 UNIT) CAPS capsule; Take 1 capsule (50,000 Units total) by mouth every 7 (seven) days.  Dispense: 4 capsule; Refill: 0  2. Prediabetes Not at goal. Goal is HgbA1c < 5.7 and insulin level closer to 5.  She is taking Saxenda 1.8 mg subcu daily, doing well, and will continue.  She will continue to focus on protein-rich, low simple carbohydrate foods. We reviewed the importance of hydration, regular exercise for stress reduction, and restorative sleep.   Lab Results  Component Value Date   HGBA1C 5.6 09/23/2019   Lab Results  Component Value Date   INSULIN 166.0 (H) 09/23/2019   3. At risk for diabetes mellitus Katrina Carlson was  given approximately 15 minutes of diabetes education and counseling today. We discussed intensive lifestyle modifications today with an emphasis on weight loss as well as increasing exercise and decreasing simple carbohydrates in her diet. We also reviewed medication options with an emphasis on risk versus benefit of those discussed.   4. Class 3 severe obesity with serious comorbidity and body mass index (BMI) of 45.0 to 49.9 in adult, unspecified obesity type (HCC)  Katrina Carlson is currently in the action stage of change. As such, her goal is to continue with weight loss efforts. She has agreed to the Category 3 Plan.   Exercise goals: For substantial health benefits, adults should do at least 150 minutes (2 hours and 30 minutes) a week of moderate-intensity, or 75 minutes (1 hour and 15 minutes) a week of vigorous-intensity aerobic physical activity, or an equivalent combination of moderate- and vigorous-intensity aerobic activity. Aerobic activity should be performed in episodes of at least 10 minutes, and preferably, it should be spread throughout the week.  Behavioral modification strategies: increasing lean protein intake, decreasing simple carbohydrates and increasing vegetables.  Katrina Carlson has agreed to follow-up with our clinic in 3 weeks. She was informed of the importance of frequent follow-up visits to maximize her success with intensive lifestyle modifications for her multiple health conditions.   Objective:   Temperature 98.1 F (36.7 C), temperature source Oral, height 5\' 7"  (1.702 m), weight (!) 304 lb (137.9 kg). Body mass index is 47.61 kg/m.  General: Cooperative, alert, well developed, in no acute  distress. HEENT: Conjunctivae and lids unremarkable. Cardiovascular: Regular rhythm.  Lungs: Normal work of breathing. Neurologic: No focal deficits.   Lab Results  Component Value Date   CREATININE 0.60 09/23/2019   BUN 8 09/23/2019   NA 137 09/23/2019   K 3.9 09/23/2019   CL  102 09/23/2019   CO2 23 09/23/2019   Lab Results  Component Value Date   ALT 17 09/23/2019   AST 16 09/23/2019   ALKPHOS 56 09/23/2019   BILITOT 0.7 09/23/2019   Lab Results  Component Value Date   HGBA1C 5.6 09/23/2019   Lab Results  Component Value Date   INSULIN 166.0 (H) 09/23/2019   Lab Results  Component Value Date   TSH 1.190 04/29/2019   Lab Results  Component Value Date   CHOL 142 04/29/2019   HDL 41 04/29/2019   LDLCALC 81 04/29/2019   TRIG 109 04/29/2019   CHOLHDL 3.5 04/29/2019   Lab Results  Component Value Date   WBC 8.7 04/29/2019   HGB 14.2 04/29/2019   HCT 40.8 04/29/2019   MCV 91 04/29/2019   PLT 318 04/29/2019   Lab Results  Component Value Date   IRON 130 04/29/2019   TIBC 275 04/29/2019   FERRITIN 75 04/29/2019   Attestation Statements:   Reviewed by clinician on day of visit: allergies, medications, problem list, medical history, surgical history, family history, social history, and previous encounter notes.  I, Insurance claims handler, CMA, am acting as transcriptionist for Helane Rima, DO  I have reviewed the above documentation for accuracy and completeness, and I agree with the above. Helane Rima, DO

## 2019-12-22 ENCOUNTER — Ambulatory Visit (INDEPENDENT_AMBULATORY_CARE_PROVIDER_SITE_OTHER): Payer: BC Managed Care – PPO | Admitting: Family Medicine

## 2019-12-22 ENCOUNTER — Encounter (INDEPENDENT_AMBULATORY_CARE_PROVIDER_SITE_OTHER): Payer: Self-pay

## 2020-03-30 ENCOUNTER — Ambulatory Visit (INDEPENDENT_AMBULATORY_CARE_PROVIDER_SITE_OTHER): Payer: BC Managed Care – PPO | Admitting: Family Medicine

## 2020-03-31 ENCOUNTER — Ambulatory Visit (INDEPENDENT_AMBULATORY_CARE_PROVIDER_SITE_OTHER): Payer: BC Managed Care – PPO | Admitting: Family Medicine

## 2020-03-31 ENCOUNTER — Other Ambulatory Visit: Payer: Self-pay

## 2020-03-31 VITALS — BP 133/80 | HR 74 | Temp 98.2°F | Ht 67.0 in | Wt 311.0 lb

## 2020-03-31 DIAGNOSIS — R7303 Prediabetes: Secondary | ICD-10-CM

## 2020-03-31 DIAGNOSIS — Z9189 Other specified personal risk factors, not elsewhere classified: Secondary | ICD-10-CM | POA: Diagnosis not present

## 2020-03-31 DIAGNOSIS — E559 Vitamin D deficiency, unspecified: Secondary | ICD-10-CM | POA: Diagnosis not present

## 2020-03-31 DIAGNOSIS — Z6841 Body Mass Index (BMI) 40.0 and over, adult: Secondary | ICD-10-CM

## 2020-03-31 DIAGNOSIS — R632 Polyphagia: Secondary | ICD-10-CM

## 2020-03-31 MED ORDER — SAXENDA 18 MG/3ML ~~LOC~~ SOPN: 3.0000 mg | PEN_INJECTOR | Freq: Every day | SUBCUTANEOUS | 0 refills | Status: DC

## 2020-03-31 MED ORDER — BD PEN NEEDLE NANO 2ND GEN 32G X 4 MM MISC: 1.0000 | Freq: Every day | 0 refills | Status: DC

## 2020-04-05 NOTE — Progress Notes (Signed)
Chief Complaint:   OBESITY Katrina Carlson is here to discuss her progress with her obesity treatment plan along with follow-up of her obesity related diagnoses.   Today's visit was #: 10 Starting weight: 331 lbs Starting date: 04/29/2019 Today's weight: 311 lbs Today's date: 03/31/2020 Total lbs lost to date: 20 lbs Body mass index is 48.71 kg/m.  Total weight loss percentage to date: -6.04%  Interim History: Katrina Carlson says she is now exercising with YouTube videos for 30-40 minutes.  She says her weight gain is due to ETOH consumption.  She says she got a new water bottle 5 days ago.  She has not been consistent with Saxenda use since 12/2018.  She says she started a weight loss challenge that ends on Saturday. Nutrition Plan: Category 3 Plan for 80% of the time. Anti-obesity medications: Saxenda 3 mg daily. Reported side effects: None. Activity: Cardio for 30-40 minutes 4 times per week.  Assessment/Plan:   1. Prediabetes Controlled. Goal is HgbA1c < 5.7.  Medication: Saxenda 3 mg subcutaneously daily.    Plan:  She will continue to focus on protein-rich, low simple carbohydrate foods. We reviewed the importance of hydration, regular exercise for stress reduction, and restorative sleep.   Lab Results  Component Value Date   HGBA1C 5.6 09/23/2019   Lab Results  Component Value Date   INSULIN 166.0 (H) 09/23/2019   2. Polyphagia Current treatment: Saxenda 3 mg subcutaneously daily. Polyphagia refers to excessive feelings of hunger.  Plan:  Will refill Saxenda and needles today, as per below.  She will continue to focus on protein-rich, low simple carbohydrate foods. We reviewed the importance of hydration, regular exercise for stress reduction, and restorative sleep.  - Refill Insulin Pen Needle (BD PEN NEEDLE NANO 2ND GEN) 32G X 4 MM MISC; 1 Package by Does not apply route daily.  Dispense: 100 each; Refill: 0 - Refill Liraglutide -Weight Management (SAXENDA) 18 MG/3ML SOPN;  Inject 3 mg into the skin daily.  Dispense: 15 mL; Refill: 0  3. Vitamin D deficiency Not at goal. Current vitamin D is 23.1, tested on 09/23/2019. Optimal goal > 50 ng/dL.   Plan: Continue to take prescription Vitamin D @50 ,000 IU every week as prescribed.  Follow-up for routine testing of Vitamin D, at least 2-3 times per year to avoid over-replacement.  4. At risk for heart disease Due to Katrina Carlson's current state of health and medical condition(s), she is at a higher risk for heart disease.  This puts the patient at much greater risk to subsequently develop cardiopulmonary conditions that can significantly affect patient's quality of life in a negative manner.    At least 11 minutes were spent on counseling Katrina Carlson about these concerns today, and I stressed the importance of reversing risks factors of obesity, especially truncal and visceral fat, hypertension, hyperlipidemia, and pre-diabetes.  The initial goal is to lose at least 5-10% of starting weight to help reduce these risk factors.  Counseling:  Intensive lifestyle modifications were discussed with Katrina Carlson as the most appropriate first line of treatment.  she will continue to work on diet, exercise, and weight loss efforts.  We will continue to reassess these conditions on a fairly regular basis in an attempt to decrease the patient's overall morbidity and mortality.  Evidence-based interventions for health behavior change were utilized today including the discussion of self monitoring techniques, problem-solving barriers, and SMART goal setting techniques.  Specifically, regarding patient's less desirable eating habits and patterns, we employed the technique  of small changes when Katrina Carlson has not been able to fully commit to her prudent nutritional plan.  5. Class 3 severe obesity with serious comorbidity and body mass index (BMI) of 45.0 to 49.9 in adult, unspecified obesity type (HCC)  Course: Katrina Carlson is currently in the action stage of  change. As such, her goal is to continue with weight loss efforts.   Nutrition goals: She has agreed to keeping a food journal and adhering to recommended goals of 1500 calories and 100 grams of protein.   Exercise goals: For substantial health benefits, adults should do at least 150 minutes (2 hours and 30 minutes) a week of moderate-intensity, or 75 minutes (1 hour and 15 minutes) a week of vigorous-intensity aerobic physical activity, or an equivalent combination of moderate- and vigorous-intensity aerobic activity. Aerobic activity should be performed in episodes of at least 10 minutes, and preferably, it should be spread throughout the week.  Behavioral modification strategies: increasing lean protein intake, decreasing simple carbohydrates, increasing vegetables and increasing water intake.  Katrina Carlson has agreed to follow-up with our clinic in 2 weeks. She was informed of the importance of frequent follow-up visits to maximize her success with intensive lifestyle modifications for her multiple health conditions.   Objective:   Blood pressure 133/80, pulse 74, temperature 98.2 F (36.8 C), temperature source Oral, height 5\' 7"  (1.702 m), weight (!) 311 lb (141.1 kg), SpO2 98 %. Body mass index is 48.71 kg/m.  General: Cooperative, alert, well developed, in no acute distress. HEENT: Conjunctivae and lids unremarkable. Cardiovascular: Regular rhythm.  Lungs: Normal work of breathing. Neurologic: No focal deficits.   Lab Results  Component Value Date   CREATININE 0.60 09/23/2019   BUN 8 09/23/2019   NA 137 09/23/2019   K 3.9 09/23/2019   CL 102 09/23/2019   CO2 23 09/23/2019   Lab Results  Component Value Date   ALT 17 09/23/2019   AST 16 09/23/2019   ALKPHOS 56 09/23/2019   BILITOT 0.7 09/23/2019   Lab Results  Component Value Date   HGBA1C 5.6 09/23/2019   Lab Results  Component Value Date   INSULIN 166.0 (H) 09/23/2019   Lab Results  Component Value Date   TSH  1.190 04/29/2019   Lab Results  Component Value Date   CHOL 142 04/29/2019   HDL 41 04/29/2019   LDLCALC 81 04/29/2019   TRIG 109 04/29/2019   CHOLHDL 3.5 04/29/2019   Lab Results  Component Value Date   WBC 8.7 04/29/2019   HGB 14.2 04/29/2019   HCT 40.8 04/29/2019   MCV 91 04/29/2019   PLT 318 04/29/2019   Lab Results  Component Value Date   IRON 130 04/29/2019   TIBC 275 04/29/2019   FERRITIN 75 04/29/2019   Attestation Statements:   Reviewed by clinician on day of visit: allergies, medications, problem list, medical history, surgical history, family history, social history, and previous encounter notes.  I, 06/29/2019, CMA, am acting as transcriptionist for Insurance claims handler, DO  I have reviewed the above documentation for accuracy and completeness, and I agree with the above. Helane Rima, DO

## 2020-04-06 ENCOUNTER — Telehealth (INDEPENDENT_AMBULATORY_CARE_PROVIDER_SITE_OTHER): Payer: Self-pay | Admitting: Family Medicine

## 2020-04-06 NOTE — Telephone Encounter (Signed)
Pt was prescribed Saxenda and pharmacy states a PA has been sent but not returned.  She would like to know when it will be completed.

## 2020-04-06 NOTE — Telephone Encounter (Signed)
DR Wallace 

## 2020-04-07 ENCOUNTER — Encounter (INDEPENDENT_AMBULATORY_CARE_PROVIDER_SITE_OTHER): Payer: Self-pay

## 2020-04-07 NOTE — Telephone Encounter (Signed)
PA approved through  08/05/2020

## 2020-04-11 ENCOUNTER — Encounter (INDEPENDENT_AMBULATORY_CARE_PROVIDER_SITE_OTHER): Payer: Self-pay | Admitting: Family Medicine

## 2020-04-19 ENCOUNTER — Ambulatory Visit (INDEPENDENT_AMBULATORY_CARE_PROVIDER_SITE_OTHER): Payer: BC Managed Care – PPO | Admitting: Physician Assistant

## 2020-04-21 ENCOUNTER — Ambulatory Visit (INDEPENDENT_AMBULATORY_CARE_PROVIDER_SITE_OTHER): Payer: BC Managed Care – PPO | Admitting: Family Medicine

## 2020-04-22 ENCOUNTER — Ambulatory Visit (INDEPENDENT_AMBULATORY_CARE_PROVIDER_SITE_OTHER): Payer: BC Managed Care – PPO | Admitting: Bariatrics

## 2020-04-22 ENCOUNTER — Encounter (INDEPENDENT_AMBULATORY_CARE_PROVIDER_SITE_OTHER): Payer: Self-pay | Admitting: Bariatrics

## 2020-04-22 ENCOUNTER — Other Ambulatory Visit: Payer: Self-pay

## 2020-04-22 VITALS — BP 145/81 | HR 72 | Temp 98.6°F | Ht 67.0 in | Wt 316.0 lb

## 2020-04-22 DIAGNOSIS — Z6841 Body Mass Index (BMI) 40.0 and over, adult: Secondary | ICD-10-CM | POA: Diagnosis not present

## 2020-04-22 DIAGNOSIS — E559 Vitamin D deficiency, unspecified: Secondary | ICD-10-CM

## 2020-04-22 DIAGNOSIS — R632 Polyphagia: Secondary | ICD-10-CM | POA: Diagnosis not present

## 2020-04-26 NOTE — Progress Notes (Signed)
Chief Complaint:   OBESITY Katrina Carlson is here to discuss her progress with her obesity treatment plan along with follow-up of her obesity related diagnoses. Katrina Carlson is on keeping a food journal and adhering to recommended goals of 1500 calories and 100 grams of protein daily and states she is following her eating plan approximately 80% of the time. Katrina Carlson states she is doing 0 minutes 0 times per week.  Today's visit was #: 11 Starting weight: 331 lbs Starting date: 04/29/2019 Today's weight: 316 lbs Today's date: 04/22/2020 Total lbs lost to date: 15 Total lbs lost since last in-office visit: 0  Interim History: Katrina Carlson is up 5 lbs. She is trying to get back on track. She is taking the Saxenda at 1.2 mg daily.  Subjective:   1. Vitamin D deficiency Katrina Carlson is currently taking multivitamins.  2. Polyphagia Katrina Carlson is currently taking Saxenda.  Assessment/Plan:   1. Vitamin D deficiency Low Vitamin D level contributes to fatigue and are associated with obesity, breast, and colon cancer. Katrina Carlson agreed to continue taking multivitamins and will follow-up for routine testing of Vitamin D, at least 2-3 times per year to avoid over-replacement.  2. Polyphagia Intensive lifestyle modifications are the first line treatment for this issue. We discussed several lifestyle modifications today. Katrina Carlson will continue Saxenda, and will continue to work on diet, exercise and weight loss efforts. Orders and follow up as documented in patient record.  Counseling . Polyphagia is excessive hunger. . Causes can include: low blood sugars, hypERthyroidism, PMS, lack of sleep, stress, insulin resistance, diabetes, certain medications, and diets that are deficient in protein and fiber.   3. Class 3 severe obesity due to excess calories with serious comorbidity and body mass index (BMI) of 45.0 to 49.9 in adult Katrina Carlson is currently in the action stage of change. As such, her goal is to continue  with weight loss efforts. She has agreed to following a lower carbohydrate, vegetable and lean protein rich diet plan.   Exercise goals: Katrina Carlson is to increase her exercise.  Behavioral modification strategies: increasing lean protein intake, decreasing simple carbohydrates, increasing vegetables, increasing water intake, decreasing eating out, no skipping meals, meal planning and cooking strategies, keeping healthy foods in the home and planning for success.  Katrina Carlson has agreed to follow-up with our clinic in 4 weeks with Dr Earlene Plater or Alois Cliche, PA-C. She was informed of the importance of frequent follow-up visits to maximize her success with intensive lifestyle modifications for her multiple health conditions.   Objective:   Blood pressure (!) 145/81, pulse 72, temperature 98.6 F (37 C), height 5\' 7"  (1.702 m), weight (!) 316 lb (143.3 kg), SpO2 97 %. Body mass index is 49.49 kg/m.  General: Cooperative, alert, well developed, in no acute distress. HEENT: Conjunctivae and lids unremarkable. Cardiovascular: Regular rhythm.  Lungs: Normal work of breathing. Neurologic: No focal deficits.   Lab Results  Component Value Date   CREATININE 0.60 09/23/2019   BUN 8 09/23/2019   NA 137 09/23/2019   K 3.9 09/23/2019   CL 102 09/23/2019   CO2 23 09/23/2019   Lab Results  Component Value Date   ALT 17 09/23/2019   AST 16 09/23/2019   ALKPHOS 56 09/23/2019   BILITOT 0.7 09/23/2019   Lab Results  Component Value Date   HGBA1C 5.6 09/23/2019   Lab Results  Component Value Date   INSULIN 166.0 (H) 09/23/2019   Lab Results  Component Value Date   TSH 1.190  04/29/2019   Lab Results  Component Value Date   CHOL 142 04/29/2019   HDL 41 04/29/2019   LDLCALC 81 04/29/2019   TRIG 109 04/29/2019   CHOLHDL 3.5 04/29/2019   Lab Results  Component Value Date   WBC 8.7 04/29/2019   HGB 14.2 04/29/2019   HCT 40.8 04/29/2019   MCV 91 04/29/2019   PLT 318 04/29/2019   Lab  Results  Component Value Date   IRON 130 04/29/2019   TIBC 275 04/29/2019   FERRITIN 75 04/29/2019   Attestation Statements:   Reviewed by clinician on day of visit: allergies, medications, problem list, medical history, surgical history, family history, social history, and previous encounter notes.  Time spent on visit including pre-visit chart review and post-visit care and charting was 20 minutes.    Trude Mcburney, am acting as Energy manager for [employer], DO.  I have reviewed the above documentation for accuracy and completeness, and I agree with the above. Corinna Capra, DO

## 2020-04-27 ENCOUNTER — Encounter (INDEPENDENT_AMBULATORY_CARE_PROVIDER_SITE_OTHER): Payer: Self-pay | Admitting: Bariatrics

## 2020-04-27 DIAGNOSIS — G5792 Unspecified mononeuropathy of left lower limb: Secondary | ICD-10-CM | POA: Insufficient documentation

## 2020-04-27 DIAGNOSIS — E559 Vitamin D deficiency, unspecified: Secondary | ICD-10-CM | POA: Insufficient documentation

## 2020-05-10 ENCOUNTER — Ambulatory Visit (INDEPENDENT_AMBULATORY_CARE_PROVIDER_SITE_OTHER): Payer: BC Managed Care – PPO | Admitting: Family Medicine

## 2020-05-26 ENCOUNTER — Ambulatory Visit (INDEPENDENT_AMBULATORY_CARE_PROVIDER_SITE_OTHER): Payer: BC Managed Care – PPO | Admitting: Family Medicine

## 2020-05-26 ENCOUNTER — Encounter (INDEPENDENT_AMBULATORY_CARE_PROVIDER_SITE_OTHER): Payer: Self-pay | Admitting: Family Medicine

## 2020-05-26 ENCOUNTER — Other Ambulatory Visit: Payer: Self-pay

## 2020-05-26 VITALS — BP 122/78 | HR 90 | Temp 97.9°F | Ht 67.0 in | Wt 317.0 lb

## 2020-05-26 DIAGNOSIS — E559 Vitamin D deficiency, unspecified: Secondary | ICD-10-CM

## 2020-05-26 DIAGNOSIS — E538 Deficiency of other specified B group vitamins: Secondary | ICD-10-CM

## 2020-05-26 DIAGNOSIS — R7303 Prediabetes: Secondary | ICD-10-CM

## 2020-05-26 DIAGNOSIS — R632 Polyphagia: Secondary | ICD-10-CM

## 2020-05-26 DIAGNOSIS — Z6841 Body Mass Index (BMI) 40.0 and over, adult: Secondary | ICD-10-CM

## 2020-05-26 DIAGNOSIS — I10 Essential (primary) hypertension: Secondary | ICD-10-CM | POA: Diagnosis not present

## 2020-05-26 DIAGNOSIS — Z9189 Other specified personal risk factors, not elsewhere classified: Secondary | ICD-10-CM

## 2020-05-26 MED ORDER — WEGOVY 0.25 MG/0.5ML ~~LOC~~ SOAJ: 0.2500 mg | SUBCUTANEOUS | 0 refills | Status: DC

## 2020-05-27 LAB — CBC WITH DIFFERENTIAL/PLATELET
Basophils Absolute: 0.1 10*3/uL (ref 0.0–0.2)
Basos: 1 %
EOS (ABSOLUTE): 0.3 10*3/uL (ref 0.0–0.4)
Eos: 3 %
Hemoglobin: 15.1 g/dL (ref 11.1–15.9)
Immature Grans (Abs): 0 10*3/uL (ref 0.0–0.1)
Immature Granulocytes: 0 %
Lymphocytes Absolute: 2.4 10*3/uL (ref 0.7–3.1)
Lymphs: 25 %
MCH: 31.2 pg (ref 26.6–33.0)
MCHC: 33.6 g/dL (ref 31.5–35.7)
MCV: 93 fL (ref 79–97)
Monocytes Absolute: 0.6 10*3/uL (ref 0.1–0.9)
Monocytes: 6 %
Neutrophils Absolute: 6.3 10*3/uL (ref 1.4–7.0)
Neutrophils: 65 %
Platelets: 343 10*3/uL (ref 150–450)
RBC: 4.84 x10E6/uL (ref 3.77–5.28)
RDW: 13.3 % (ref 11.7–15.4)
WBC: 9.6 10*3/uL (ref 3.4–10.8)

## 2020-05-27 LAB — LIPID PANEL
Chol/HDL Ratio: 4.1 ratio (ref 0.0–4.4)
Cholesterol, Total: 159 mg/dL (ref 100–199)
HDL: 39 mg/dL — ABNORMAL LOW (ref >39)
LDL Chol Calc (NIH): 97 mg/dL (ref 0–99)
Triglycerides: 129 mg/dL (ref 0–149)
VLDL Cholesterol Cal: 23 mg/dL (ref 5–40)

## 2020-05-27 LAB — COMPREHENSIVE METABOLIC PANEL
ALT: 19 IU/L (ref 0–32)
AST: 21 IU/L (ref 0–40)
Albumin/Globulin Ratio: 1.2 (ref 1.2–2.2)
Albumin: 3.9 g/dL (ref 3.8–4.8)
Alkaline Phosphatase: 57 IU/L (ref 44–121)
BUN/Creatinine Ratio: 13 (ref 9–23)
BUN: 10 mg/dL (ref 6–20)
Bilirubin Total: 0.5 mg/dL (ref 0.0–1.2)
CO2: 16 mmol/L — ABNORMAL LOW (ref 20–29)
Calcium: 9.2 mg/dL (ref 8.7–10.2)
Chloride: 102 mmol/L (ref 96–106)
Creatinine, Ser: 0.76 mg/dL (ref 0.57–1.00)
Globulin, Total: 3.3 g/dL (ref 1.5–4.5)
Glucose: 88 mg/dL (ref 65–99)
Potassium: 3.9 mmol/L (ref 3.5–5.2)
Sodium: 138 mmol/L (ref 134–144)
Total Protein: 7.2 g/dL (ref 6.0–8.5)
eGFR: 105 mL/min/{1.73_m2} (ref >59)

## 2020-05-27 LAB — HEMOGLOBIN A1C
Est. average glucose Bld gHb Est-mCnc: 126 mg/dL
Hgb A1c MFr Bld: 6 % — ABNORMAL HIGH (ref 4.8–5.6)

## 2020-05-27 LAB — ANEMIA PANEL
Ferritin: 77 ng/mL (ref 15–150)
Folate, Hemolysate: 318 ng/mL
Folate, RBC: 708 ng/mL (ref >498)
Hematocrit: 44.9 % (ref 34.0–46.6)
Iron Saturation: 17 % (ref 15–55)
Iron: 54 ug/dL (ref 27–159)
Retic Ct Pct: 2 % (ref 0.6–2.6)
Total Iron Binding Capacity: 318 ug/dL (ref 250–450)
UIBC: 264 ug/dL (ref 131–425)
Vitamin B-12: 379 pg/mL (ref 232–1245)

## 2020-05-27 LAB — INSULIN, RANDOM: INSULIN: 70.2 u[IU]/mL — ABNORMAL HIGH (ref 2.6–24.9)

## 2020-05-27 LAB — VITAMIN D 25 HYDROXY (VIT D DEFICIENCY, FRACTURES): Vit D, 25-Hydroxy: 12.7 ng/mL — ABNORMAL LOW (ref 30.0–100.0)

## 2020-06-01 NOTE — Progress Notes (Signed)
Chief Complaint:   OBESITY Katrina Carlson is here to discuss her progress with her obesity treatment plan along with follow-up of her obesity related diagnoses.   Today's visit was #: 12 Starting weight: 331 lbs Starting date: 04/29/2019 Today's weight: 317 lbs Today's date: 05/26/2020 Total lbs lost to date: 14 lbs Body mass index is 49.65 kg/m.  Total weight loss percentage to date: -4.23%  Interim History: Katrina Carlson says she stopped [place] while when was on vacation for 1 week. She went to [facility] and did lots of eating, drinking, and walking. She started back on Saxenda 1.2 mg yesterday. She does well on the 1.2-1.8 mg dose of Saxenda. She is working on getting back on the plan.  Current Meal Plan: the Category 3 Plan for 75% of the time.  Current Exercise Plan: Walking for 30 minutes 2 times per week. Current Anti-Obesity Medications: Saxenda 1.2 mg subcutaneously daily. Side effects: None.  Assessment/Plan:   1. Polyphagia Not at goal. Current treatment: Saxenda 1.2 mg subcutaneously daily. Polyphagia refers to excessive feelings of hunger.  Plan: She will continue to focus on protein-rich, low simple carbohydrate foods. We reviewed the importance of hydration, regular exercise for stress reduction, and restorative sleep. Stop the Suzan Garibaldi [facility] 0.25mg  weekly.  - Start Semaglutide-Weight Management (WEGOVY) 0.25 MG/0.5ML SOAJ; Inject 0.25 mg into the skin once a week.  Dispense: 2 mL; Refill: 0  2. Vitamin D deficiency Improving, but not optimized. Current vitamin D is 23.1, tested on 09/23/2019. Optimal goal > 50 ng/dL.   Plan: Continue current OTC vitamin D supplementation.  Follow-up for routine testing of Vitamin D, at least 2-3 times per year to avoid over-replacement. We will check labs today.  - VITAMIN D 25 Hydroxy (Vit-D Deficiency, Fractures)  3. Prediabetes Not at goal. Goal is HgbA1c < 5.7.  Medication: None.    Plan:  She will continue to focus on  protein-rich, low simple carbohydrate foods. We reviewed the importance of hydration, regular exercise for stress reduction, and restorative sleep. We will check labs today.  Lab Results  Component Value Date   HGBA1C 6.0 (H) 05/26/2020   Lab Results  Component Value Date   INSULIN 70.2 (H) 05/26/2020   INSULIN 166.0 (H) 09/23/2019   - Hemoglobin A1c - Insulin, random - Lipid panel  4. Essential hypertension Controlled. Medications: None.   Plan: Avoid buying foods that are: processed, frozen, or prepackaged to avoid excess salt. We will continue to monitor closely alongside her PCP and/or Specialist.  We will check labs today.  BP Readings from Last 3 Encounters:  05/26/20 122/78  04/22/20 (!) 145/81  03/31/20 133/80   Lab Results  Component Value Date   CREATININE 0.76 05/26/2020   - Comprehensive metabolic panel  5. Vitamin B12 deficiency Lab Results  Component Value Date   VITAMINB12 379 05/26/2020   Supplementation: Daily multivitamin. Plan: Continue current treatment and we will check labs today.  - Anemia panel - CBC with Differential/Platelet  6. At risk for diabetes mellitus Katrina Carlson was given diabetes prevention education and counseling today of more than 10 minutes. She will continue to focus on protein-rich, low simple carbohydrate foods. We reviewed the importance of hydration, regular exercise for stress reduction, and restorative sleep.   7. Obesity, current BMI 49.7  Course: Katrina Carlson is currently in the action stage of change. As such, her goal is to continue with weight loss efforts.   Nutrition goals: She has agreed to the Category 3 Plan.  Exercise goals: For substantial health benefits, adults should do at least 150 minutes (2 hours and 30 minutes) a week of moderate-intensity, or 75 minutes (1 hour and 15 minutes) a week of vigorous-intensity aerobic physical activity, or an equivalent combination of moderate- and vigorous-intensity aerobic  activity. Aerobic activity should be performed in episodes of at least 10 minutes, and preferably, it should be spread throughout the week.  Behavioral modification strategies: increasing lean protein intake, decreasing simple carbohydrates, increasing vegetables and increasing water intake.  Katrina Carlson has agreed to follow-up with our clinic in 3 weeks. She was informed of the importance of frequent follow-up visits to maximize her success with intensive lifestyle modifications for her multiple health conditions.   Katrina Carlson was informed we would discuss her lab results at her next visit unless there is a critical issue that needs to be addressed sooner. Katrina Carlson agreed to keep her next visit at the agreed upon time to discuss these results.  Objective:   Blood pressure 122/78, pulse 90, temperature 97.9 F (36.6 C), temperature source Oral, height 5\' 7"  (1.702 m), weight (!) 317 lb (143.8 kg), SpO2 95 %. Body mass index is 49.65 kg/m.  General: Cooperative, alert, well developed, in no acute distress. HEENT: Conjunctivae and lids unremarkable. Cardiovascular: Regular rhythm.  Lungs: Normal work of breathing. Neurologic: No focal deficits.   Lab Results  Component Value Date   HGBA1C 5.6 09/23/2019   Lab Results  Component Value Date   INSULIN 166.0 (H) 09/23/2019   Lab Results  Component Value Date   TSH 1.190 04/29/2019   Attestation Statements:   Reviewed by clinician on day of visit: allergies, medications, problem list, medical history, surgical history, family history, social history, and previous encounter notes.  06/29/2019 [place], CMA, am acting as 07-27-1972 for Energy manager, DO.  I have reviewed the above documentation for accuracy and completeness, and I agree with the above. [employer], DO

## 2020-06-08 ENCOUNTER — Encounter (INDEPENDENT_AMBULATORY_CARE_PROVIDER_SITE_OTHER): Payer: Self-pay

## 2020-06-21 ENCOUNTER — Ambulatory Visit (INDEPENDENT_AMBULATORY_CARE_PROVIDER_SITE_OTHER): Payer: BC Managed Care – PPO | Admitting: Physician Assistant

## 2020-06-22 ENCOUNTER — Ambulatory Visit (INDEPENDENT_AMBULATORY_CARE_PROVIDER_SITE_OTHER): Payer: BC Managed Care – PPO | Admitting: Physician Assistant

## 2020-06-22 ENCOUNTER — Other Ambulatory Visit: Payer: Self-pay

## 2020-06-22 VITALS — BP 130/77 | HR 78 | Temp 98.6°F | Ht 67.0 in | Wt 318.0 lb

## 2020-06-22 DIAGNOSIS — Z6841 Body Mass Index (BMI) 40.0 and over, adult: Secondary | ICD-10-CM | POA: Diagnosis not present

## 2020-06-22 DIAGNOSIS — Z9189 Other specified personal risk factors, not elsewhere classified: Secondary | ICD-10-CM | POA: Diagnosis not present

## 2020-06-22 DIAGNOSIS — R7303 Prediabetes: Secondary | ICD-10-CM | POA: Diagnosis not present

## 2020-06-22 DIAGNOSIS — E559 Vitamin D deficiency, unspecified: Secondary | ICD-10-CM | POA: Diagnosis not present

## 2020-06-23 NOTE — Progress Notes (Signed)
Chief Complaint:   OBESITY Katrina Carlson is here to discuss her progress with her obesity treatment plan along with follow-up of her obesity related diagnoses. Katrina Carlson is on the Category 3 Plan and states she is following her eating plan approximately 80% of the time. Katrina Carlson states she is doing 0 minutes 0 times per week.  Today's visit was #: 13 Starting weight: 331 lbs Starting date: 04/29/2019 Today's weight: 318 lbs Today's date: 06/22/2020 Total lbs lost to date: 13 Total lbs lost since last in-office visit: 0  Interim History: Katrina Carlson continues with Saxenda 1.2 mg. She has been busy with work and has not been fully following the plan. She states she is not hungry throughout the day. She has not yet started [facility] as she wants to finish Saxenda first.  Subjective:   1. Pre-diabetes Katrina Carlson's last A1c was 6.0. She is on Saxenda currently. She will start [facility] after finishing with remaining Saxenda. I discussed labs with the patient today.  2. Vitamin D deficiency Katrina Carlson is not taking supplemental Vit D because she says it changes her menstrual cycle. I discussed labs with the patient today.  3. At risk for diabetes mellitus Katrina Carlson is at higher than average risk for developing diabetes due to obesity.   Assessment/Plan:   1. Pre-diabetes Katrina Carlson will continue her meal plan and Saxenda, and decreasing simple carbohydrates to help decrease the risk of diabetes.   2. Vitamin D deficiency Low Vitamin D level contributes to fatigue and are associated with obesity, breast, and colon cancer. Katrina Carlson agreed to continue taking Vitamin D gummies 3,000 units daily and will follow-up for routine testing of Vitamin D, at least 2-3 times per year to avoid over-replacement.  3. At risk for diabetes mellitus Katrina Carlson was given approximately 15 minutes of diabetes education and counseling today. We discussed intensive lifestyle modifications today with an emphasis on weight loss as well as  increasing exercise and decreasing simple carbohydrates in her diet. We also reviewed medication options with an emphasis on risk versus benefit of those discussed.   Repetitive spaced learning was employed today to elicit superior memory formation and behavioral change.  4. Class 3 severe obesity due to excess calories with serious comorbidity and body mass index (BMI) of 45.0 to 49.9 in adult Katrina Carlson is currently in the action stage of change. As such, her goal is to continue with weight loss efforts. She has agreed to the Category 3 Plan.   Exercise goals: No exercise has been prescribed at this time.  Behavioral modification strategies: increasing lean protein intake and no skipping meals.  Katrina Carlson has agreed to follow-up with our clinic in 4 weeks. She was informed of the importance of frequent follow-up visits to maximize her success with intensive lifestyle modifications for her multiple health conditions.   Objective:   Blood pressure 130/77, pulse 78, temperature 98.6 F (37 C), height 5\' 7"  (1.702 m), weight (!) 318 lb (144.2 kg), SpO2 97 %. Body mass index is 49.81 kg/m.  General: Cooperative, alert, well developed, in no acute distress. HEENT: Conjunctivae and lids unremarkable. Cardiovascular: Regular rhythm.  Lungs: Normal work of breathing. Neurologic: No focal deficits.   Lab Results  Component Value Date   CREATININE 0.76 05/26/2020   BUN 10 05/26/2020   NA 138 05/26/2020   K 3.9 05/26/2020   CL 102 05/26/2020   CO2 16 (L) 05/26/2020   Lab Results  Component Value Date   ALT 19 05/26/2020   AST 21  05/26/2020   ALKPHOS 57 05/26/2020   BILITOT 0.5 05/26/2020   Lab Results  Component Value Date   HGBA1C 6.0 (H) 05/26/2020   HGBA1C 5.6 09/23/2019   Lab Results  Component Value Date   INSULIN 70.2 (H) 05/26/2020   INSULIN 166.0 (H) 09/23/2019   Lab Results  Component Value Date   TSH 1.190 04/29/2019   Lab Results  Component Value Date    CHOL 159 05/26/2020   HDL 39 (L) 05/26/2020   LDLCALC 97 05/26/2020   TRIG 129 05/26/2020   CHOLHDL 4.1 05/26/2020   Lab Results  Component Value Date   WBC 9.6 05/26/2020   HGB 15.1 05/26/2020   HCT 44.9 05/26/2020   MCV 93 05/26/2020   PLT 343 05/26/2020   Lab Results  Component Value Date   IRON 54 05/26/2020   TIBC 318 05/26/2020   FERRITIN 77 05/26/2020   Attestation Statements:   Reviewed by clinician on day of visit: allergies, medications, problem list, medical history, surgical history, family history, social history, and previous encounter notes.   Trude Mcburney, am acting as transcriptionist for [employer], PA-C.  I have reviewed the above documentation for accuracy and completeness, and I agree with the above. Alois Cliche, PA-C

## 2020-07-20 ENCOUNTER — Ambulatory Visit (INDEPENDENT_AMBULATORY_CARE_PROVIDER_SITE_OTHER): Payer: BC Managed Care – PPO | Admitting: Family Medicine

## 2020-07-22 ENCOUNTER — Other Ambulatory Visit: Payer: Self-pay

## 2020-07-22 ENCOUNTER — Encounter (INDEPENDENT_AMBULATORY_CARE_PROVIDER_SITE_OTHER): Payer: Self-pay | Admitting: Family Medicine

## 2020-07-22 ENCOUNTER — Ambulatory Visit (INDEPENDENT_AMBULATORY_CARE_PROVIDER_SITE_OTHER): Payer: BC Managed Care – PPO | Admitting: Family Medicine

## 2020-07-22 VITALS — BP 126/81 | HR 68 | Temp 98.4°F | Ht 67.0 in | Wt 321.0 lb

## 2020-07-22 DIAGNOSIS — I10 Essential (primary) hypertension: Secondary | ICD-10-CM

## 2020-07-22 DIAGNOSIS — R7301 Impaired fasting glucose: Secondary | ICD-10-CM | POA: Diagnosis not present

## 2020-07-22 DIAGNOSIS — Z9189 Other specified personal risk factors, not elsewhere classified: Secondary | ICD-10-CM | POA: Diagnosis not present

## 2020-07-22 DIAGNOSIS — R632 Polyphagia: Secondary | ICD-10-CM

## 2020-07-22 DIAGNOSIS — E538 Deficiency of other specified B group vitamins: Secondary | ICD-10-CM

## 2020-07-22 DIAGNOSIS — Z6841 Body Mass Index (BMI) 40.0 and over, adult: Secondary | ICD-10-CM

## 2020-07-22 MED ORDER — OZEMPIC (0.25 OR 0.5 MG/DOSE) 2 MG/1.5ML ~~LOC~~ SOPN: 0.5000 mg | PEN_INJECTOR | SUBCUTANEOUS | 0 refills | Status: DC

## 2020-07-22 NOTE — Progress Notes (Signed)
Chief Complaint:   OBESITY Katrina Carlson is here to discuss her progress with her obesity treatment plan along with follow-up of her obesity related diagnoses. See Medical Weight Management Flowsheet for bioelectrical impedance results.  Today's visit was #: 14 Starting weight: 331 lbs Starting date: 04/29/2019 Today's weight: 321 lbs Today's date: 07/22/2020 Weight change since last visit: +3 lbs Total lbs lost to date: 10 lbs Body mass index is 50.28 kg/m.  Total weight loss percentage to date: -3.02%  Interim History: Katrina Carlson's body composition changes are improving.  She is going on vacation.     06/22/2020 07/22/2020  Height 5\' 7"  (1.702 m) 5\' 7"  (1.702 m)  Weight 318 lb (144.2 kg) (A) 321 lb (145.6 kg) (A)  BMI (Calculated) 49.79 50.26  BLOOD PRESSURE - SYSTOLIC 130 126  BLOOD PRESSURE - DIASTOLIC 77 81  PULSE 78 68   Body Fat % 48.3 % 46.1 %  Muscle Mass (lbs) 156.61 lbs 164.8 lbs  Fat Mass (lbs) 154.01 lbs 148.2 lbs  Total Body Water (lbs) 114.41 lbs 118.6 lbs  Visceral Fat Rating  16 15   Nutrition Plan: the Category 4 Plan for 50% of the time. Activity: Walking for 20-30 minutes 2 times per week Anti-obesity medications: Ozempic 0.25 mg subcutaneously weekly. Reported side effects: None.  Assessment/Plan:   1. Impaired fasting glucose Will increase Ozempic to 0.5 mg subcutaneously weekly, as per below. She will continue to focus on protein-rich, low simple carbohydrate foods. We reviewed the importance of hydration, regular exercise for stress reduction, and restorative sleep.   - Increase and refill semaglutide,0.25 or 0.5MG /DOS, (OZEMPIC, 0.25 OR 0.5 MG/DOSE,) 2 MG/1.5ML SOPN; Inject 0.5 mg into the skin once a week.  Dispense: 1.5 mL; Refill: 0  2. Polyphagia Controlled. Current treatment: Ozempic 0.25 mg subcutaneously weekly. Polyphagia refers to excessive feelings of hunger.   3. Essential hypertension At goal. Medications: None.   Plan: Avoid buying foods  that are: processed, frozen, or prepackaged to avoid excess salt. We will watch for signs of hypotension as she continues lifestyle modifications.  BP Readings from Last 3 Encounters:  07/22/20 126/81  06/22/20 130/77  05/26/20 122/78   Lab Results  Component Value Date   CREATININE 0.76 05/26/2020   4. Vitamin B12 deficiency Lab Results  Component Value Date   VITAMINB12 379 05/26/2020   Supplementation: None. Plan:  Will continue to monitor.   5. At risk for diabetes mellitus Katrina Carlson was given diabetes prevention education and counseling today of more than 8 minutes. During insulin resistance, several metabolic alterations induce the development of cardiovascular disease. For instance, insulin resistance can induce an imbalance in glucose metabolism that generates chronic hyperglycemia, which in turn triggers oxidative stress and causes an inflammatory response that leads to cell damage. Insulin resistance can also alter systemic lipid metabolism which then leads to the development of dyslipidemia and the well-known lipid triad: (1) high levels of plasma triglycerides, (2) low levels of high-density lipoprotein, and (3) the appearance of small dense low-density lipoproteins. This triad, along with endothelial dysfunction, which can also be induced by aberrant insulin signaling, contribute to atherosclerotic plaque formation.   6. Obesity, current BMI 50.4  Course: Katrina Carlson is currently in the action stage of change. As such, her goal is to continue with weight loss efforts.   Nutrition goals: She has agreed to the Category 4 Plan.   Exercise goals:  As is.  Behavioral modification strategies: increasing lean protein intake, decreasing simple carbohydrates, increasing  vegetables and increasing water intake.  Katrina Carlson has agreed to follow-up with our clinic in 3 weeks. She was informed of the importance of frequent follow-up visits to maximize her success with intensive lifestyle  modifications for her multiple health conditions.   Objective:   Blood pressure 126/81, pulse 68, temperature 98.4 F (36.9 C), temperature source Oral, height 5\' 7"  (1.702 m), weight (!) 321 lb (145.6 kg), SpO2 96 %. Body mass index is 50.28 kg/m.  General: Cooperative, alert, well developed, in no acute distress. HEENT: Conjunctivae and lids unremarkable. Cardiovascular: Regular rhythm.  Lungs: Normal work of breathing. Neurologic: No focal deficits.   Lab Results  Component Value Date   CREATININE 0.76 05/26/2020   BUN 10 05/26/2020   NA 138 05/26/2020   K 3.9 05/26/2020   CL 102 05/26/2020   CO2 16 (L) 05/26/2020   Lab Results  Component Value Date   ALT 19 05/26/2020   AST 21 05/26/2020   ALKPHOS 57 05/26/2020   BILITOT 0.5 05/26/2020   Lab Results  Component Value Date   HGBA1C 6.0 (H) 05/26/2020   HGBA1C 5.6 09/23/2019   Lab Results  Component Value Date   INSULIN 70.2 (H) 05/26/2020   INSULIN 166.0 (H) 09/23/2019   Lab Results  Component Value Date   TSH 1.190 04/29/2019   Lab Results  Component Value Date   CHOL 159 05/26/2020   HDL 39 (L) 05/26/2020   LDLCALC 97 05/26/2020   TRIG 129 05/26/2020   CHOLHDL 4.1 05/26/2020   Lab Results  Component Value Date   WBC 9.6 05/26/2020   HGB 15.1 05/26/2020   HCT 44.9 05/26/2020   MCV 93 05/26/2020   PLT 343 05/26/2020   Lab Results  Component Value Date   IRON 54 05/26/2020   TIBC 318 05/26/2020   FERRITIN 77 05/26/2020   Attestation Statements:   Reviewed by clinician on day of visit: allergies, medications, problem list, medical history, surgical history, family history, social history, and previous encounter notes.  I, 07/26/2020, CMA, am acting as transcriptionist for Insurance claims handler, DO  I have reviewed the above documentation for accuracy and completeness, and I agree with the above. Helane Rima, DO

## 2020-08-24 ENCOUNTER — Ambulatory Visit (INDEPENDENT_AMBULATORY_CARE_PROVIDER_SITE_OTHER): Payer: BC Managed Care – PPO | Admitting: Family Medicine

## 2021-02-01 ENCOUNTER — Ambulatory Visit (INDEPENDENT_AMBULATORY_CARE_PROVIDER_SITE_OTHER): Payer: BC Managed Care – PPO | Admitting: Physician Assistant

## 2021-02-01 ENCOUNTER — Other Ambulatory Visit: Payer: Self-pay

## 2021-02-01 ENCOUNTER — Encounter (INDEPENDENT_AMBULATORY_CARE_PROVIDER_SITE_OTHER): Payer: Self-pay | Admitting: Physician Assistant

## 2021-02-01 VITALS — BP 133/83 | HR 83 | Temp 98.2°F | Ht 67.0 in | Wt 324.0 lb

## 2021-02-01 DIAGNOSIS — R7303 Prediabetes: Secondary | ICD-10-CM

## 2021-02-01 DIAGNOSIS — E559 Vitamin D deficiency, unspecified: Secondary | ICD-10-CM | POA: Diagnosis not present

## 2021-02-01 DIAGNOSIS — Z9189 Other specified personal risk factors, not elsewhere classified: Secondary | ICD-10-CM

## 2021-02-01 DIAGNOSIS — E785 Hyperlipidemia, unspecified: Secondary | ICD-10-CM | POA: Diagnosis not present

## 2021-02-01 DIAGNOSIS — Z6841 Body Mass Index (BMI) 40.0 and over, adult: Secondary | ICD-10-CM

## 2021-02-01 NOTE — Progress Notes (Signed)
Chief Complaint:   OBESITY Katrina Carlson is here to discuss her progress with her obesity treatment plan along with follow-up of her obesity related diagnoses. Katrina Carlson is on the Category 4 Plan and states she is following her eating plan approximately 0% of the time. Katrina Carlson states she is walking 20 minutes 1 times per week.  Today's visit was #: 15 Starting weight: 331 lbs Starting date: 04/29/2019 Today's weight: 324 lbs Today's date: 02/01/2021 Total lbs lost to date: 7 Total lbs lost since last in-office visit: 0  Interim History: Katrina Carlson has not been in for an OV in 6 months and has not been following plan. She reports being ready to restart category 4. She is currently only eating 1 meal per day. She wants to start exercising regularly.  Subjective:   1. Prediabetes Pt is not on meds currently but was previously on Ozempic. She denies polyphagia.  2. Vitamin D deficiency She is not currently on Vit D supplementation.  3. Dyslipidemia Katrina Carlson is not on satin therapy.  4. At risk for diabetes mellitus Katrina Carlson is at higher than average risk for developing diabetes due to obesity.   Assessment/Plan:   1. Prediabetes Katrina Carlson will continue to work on weight loss, exercise, and decreasing simple carbohydrates to help decrease the risk of diabetes. Check labs today.  - Comprehensive metabolic panel - Hemoglobin A1c - Insulin, random  2. Vitamin D deficiency Low Vitamin D level contributes to fatigue and are associated with obesity, breast, and colon cancer. She will follow-up for routine testing of Vitamin D, at least 2-3 times per year to avoid over-replacement. Check labs today.  - VITAMIN D 25 Hydroxy (Vit-D Deficiency, Fractures)  3. Dyslipidemia Cardiovascular risk and specific lipid/LDL goals reviewed.  We discussed several lifestyle modifications today and Katrina Carlson will continue to work on diet, exercise and weight loss efforts. Orders and follow up as documented in  patient record.   Counseling Intensive lifestyle modifications are the first line treatment for this issue. Dietary changes: Increase soluble fiber. Decrease simple carbohydrates. Exercise changes: Moderate to vigorous-intensity aerobic activity 150 minutes per week if tolerated. Lipid-lowering medications: see documented in medical record. Check labs today.  - Lipid panel  4. At risk for diabetes mellitus Katrina Carlson was given approximately 15 minutes of diabetes education and counseling today. We discussed intensive lifestyle modifications today with an emphasis on weight loss as well as increasing exercise and decreasing simple carbohydrates in her diet. We also reviewed medication options with an emphasis on risk versus benefit of those discussed.   Repetitive spaced learning was employed today to elicit superior memory formation and behavioral change.  5. Obesity, current BMI 50.73  Katrina Carlson is currently in the action stage of change. As such, her goal is to continue with weight loss efforts. She has agreed to the Category 4 Plan.   Exercise goals:  As is  Behavioral modification strategies: no skipping meals and meal planning and cooking strategies.  Katrina Carlson has agreed to follow-up with our clinic in 3 weeks. She was informed of the importance of frequent follow-up visits to maximize her success with intensive lifestyle modifications for her multiple health conditions.   Katrina Carlson was informed we would discuss her lab results at her next visit unless there is a critical issue that needs to be addressed sooner. Katrina Carlson agreed to keep her next visit at the agreed upon time to discuss these results.  Objective:   Blood pressure 133/83, pulse 83, temperature 98.2 F (36.8 C),  height 5\' 7"  (1.702 m), weight (!) 324 lb (147 kg), SpO2 96 %. Body mass index is 50.75 kg/m.  General: Cooperative, alert, well developed, in no acute distress. HEENT: Conjunctivae and lids  unremarkable. Cardiovascular: Regular rhythm.  Lungs: Normal work of breathing. Neurologic: No focal deficits.   Lab Results  Component Value Date   CREATININE 0.76 05/26/2020   BUN 10 05/26/2020   NA 138 05/26/2020   K 3.9 05/26/2020   CL 102 05/26/2020   CO2 16 (L) 05/26/2020   Lab Results  Component Value Date   ALT 19 05/26/2020   AST 21 05/26/2020   ALKPHOS 57 05/26/2020   BILITOT 0.5 05/26/2020   Lab Results  Component Value Date   HGBA1C 6.0 (H) 05/26/2020   HGBA1C 5.6 09/23/2019   Lab Results  Component Value Date   INSULIN 70.2 (H) 05/26/2020   INSULIN 166.0 (H) 09/23/2019   Lab Results  Component Value Date   TSH 1.190 04/29/2019   Lab Results  Component Value Date   CHOL 159 05/26/2020   HDL 39 (L) 05/26/2020   LDLCALC 97 05/26/2020   TRIG 129 05/26/2020   CHOLHDL 4.1 05/26/2020   Lab Results  Component Value Date   VD25OH 12.7 (L) 05/26/2020   VD25OH 23.1 (L) 09/23/2019   VD25OH 7.9 (L) 04/29/2019   Lab Results  Component Value Date   WBC 9.6 05/26/2020   HGB 15.1 05/26/2020   HCT 44.9 05/26/2020   MCV 93 05/26/2020   PLT 343 05/26/2020   Lab Results  Component Value Date   IRON 54 05/26/2020   TIBC 318 05/26/2020   FERRITIN 77 05/26/2020    Attestation Statements:   Reviewed by clinician on day of visit: allergies, medications, problem list, medical history, surgical history, family history, social history, and previous encounter notes.  07/26/2020, CMA, am acting as transcriptionist for Edmund Hilda, PA-C.  I have reviewed the above documentation for accuracy and completeness, and I agree with the above. [employer], PA-C

## 2021-02-02 LAB — COMPREHENSIVE METABOLIC PANEL
ALT: 15 IU/L (ref 0–32)
AST: 16 IU/L (ref 0–40)
Albumin/Globulin Ratio: 1.1 — ABNORMAL LOW (ref 1.2–2.2)
Albumin: 3.9 g/dL (ref 3.8–4.8)
Alkaline Phosphatase: 64 IU/L (ref 44–121)
BUN/Creatinine Ratio: 15 (ref 9–23)
BUN: 10 mg/dL (ref 6–20)
Bilirubin Total: 0.3 mg/dL (ref 0.0–1.2)
CO2: 22 mmol/L (ref 20–29)
Calcium: 8.8 mg/dL (ref 8.7–10.2)
Chloride: 104 mmol/L (ref 96–106)
Creatinine, Ser: 0.65 mg/dL (ref 0.57–1.00)
Globulin, Total: 3.4 g/dL (ref 1.5–4.5)
Glucose: 99 mg/dL (ref 70–99)
Potassium: 4.4 mmol/L (ref 3.5–5.2)
Sodium: 140 mmol/L (ref 134–144)
Total Protein: 7.3 g/dL (ref 6.0–8.5)
eGFR: 118 mL/min/{1.73_m2} (ref >59)

## 2021-02-02 LAB — VITAMIN D 25 HYDROXY (VIT D DEFICIENCY, FRACTURES): Vit D, 25-Hydroxy: 12.9 ng/mL — ABNORMAL LOW (ref 30.0–100.0)

## 2021-02-02 LAB — LIPID PANEL
Chol/HDL Ratio: 4 ratio (ref 0.0–4.4)
Cholesterol, Total: 159 mg/dL (ref 100–199)
HDL: 40 mg/dL (ref >39)
LDL Chol Calc (NIH): 103 mg/dL — ABNORMAL HIGH (ref 0–99)
Triglycerides: 87 mg/dL (ref 0–149)
VLDL Cholesterol Cal: 16 mg/dL (ref 5–40)

## 2021-02-02 LAB — INSULIN, RANDOM: INSULIN: 104 u[IU]/mL — ABNORMAL HIGH (ref 2.6–24.9)

## 2021-02-02 LAB — HEMOGLOBIN A1C
Est. average glucose Bld gHb Est-mCnc: 126 mg/dL
Hgb A1c MFr Bld: 6 % — ABNORMAL HIGH (ref 4.8–5.6)

## 2021-03-01 ENCOUNTER — Ambulatory Visit (INDEPENDENT_AMBULATORY_CARE_PROVIDER_SITE_OTHER): Payer: BC Managed Care – PPO | Admitting: Physician Assistant

## 2021-04-21 ENCOUNTER — Other Ambulatory Visit: Payer: Self-pay

## 2021-04-21 ENCOUNTER — Other Ambulatory Visit (HOSPITAL_BASED_OUTPATIENT_CLINIC_OR_DEPARTMENT_OTHER): Payer: Self-pay

## 2021-04-21 ENCOUNTER — Emergency Department (HOSPITAL_BASED_OUTPATIENT_CLINIC_OR_DEPARTMENT_OTHER)
Admission: EM | Admit: 2021-04-21 | Discharge: 2021-04-21 | Disposition: A | Discharge status: Home / Self Care | Payer: BC Managed Care – PPO | Attending: Emergency Medicine | Admitting: Emergency Medicine

## 2021-04-21 ENCOUNTER — Emergency Department (HOSPITAL_BASED_OUTPATIENT_CLINIC_OR_DEPARTMENT_OTHER): Payer: BC Managed Care – PPO

## 2021-04-21 ENCOUNTER — Encounter (HOSPITAL_BASED_OUTPATIENT_CLINIC_OR_DEPARTMENT_OTHER): Payer: Self-pay | Admitting: Emergency Medicine

## 2021-04-21 DIAGNOSIS — S93602A Unspecified sprain of left foot, initial encounter: Secondary | ICD-10-CM | POA: Insufficient documentation

## 2021-04-21 DIAGNOSIS — Y9301 Activity, walking, marching and hiking: Secondary | ICD-10-CM | POA: Diagnosis not present

## 2021-04-21 DIAGNOSIS — S99922A Unspecified injury of left foot, initial encounter: Secondary | ICD-10-CM | POA: Diagnosis present

## 2021-04-21 DIAGNOSIS — W19XXXA Unspecified fall, initial encounter: Secondary | ICD-10-CM | POA: Insufficient documentation

## 2021-04-21 HISTORY — DX: Obesity, unspecified: E66.9

## 2021-04-21 MED ORDER — NAPROXEN 500 MG PO TABS
500.0000 mg | ORAL_TABLET | Freq: Two times a day (BID) | ORAL | 0 refills | Status: DC
  Filled 2021-04-21: qty 20, 10d supply, fill #0

## 2021-04-21 NOTE — ED Provider Notes (Signed)
?[facility] ?Provider Note ? ? ?CSN: [id number] ?Arrival date & time: 04/21/21  1502 ? ?  ? ?History ? ?Chief Complaint  ?Patient presents with  ? Ankle Pain  ? ? ?Katrina Carlson is a 36 y.o. female. ? ?Patient is a 36 year old female presenting today with complaint of left foot/ankle pain.  She reports that last night she was walking with flat boots on when she suddenly felt a searing pain on the medial side of her left foot and ankle.  She denies twisting her foot, falling or stepping on uneven pavement.  However since that time she has had significant pain with walking.  It is painful to flex or to point her foot.  No prior injury to this area.  It has seemed a bit swollen to her today. ? ?The history is provided by the patient.  ?Ankle Pain ? ?  ? ?Home Medications ?Prior to Admission medications   ?Medication Sig Start Date End Date Taking? Authorizing Provider  ?Black Elderberry (SAMBUCUS ELDERBERRY PO) Take 1 each by mouth daily.   Yes [provider]  ?Cholecalciferol (VITAMIN D3 ADULT GUMMIES) 25 MCG (1000 UT) CHEW 1 tablet   Yes [provider]  ?naproxen (NAPROSYN) 500 MG tablet Take 1 tablet (500 mg total) by mouth 2 (two) times daily. 04/21/21  Yes Gwyneth Sprout, MD  ?Probiotic Product (PROBIOTIC-10 PO) Take 1 capsule by mouth daily.   Yes [provider]  ?Liraglutide -Weight Management (SAXENDA) 18 MG/3ML SOPN See admin instructions.    [provider]  ?   ? ?Allergies    ?Patient has no known allergies.   ? ?Review of Systems   ?Review of Systems ? ?Physical Exam ?Updated Vital Signs ?BP 136/75 (BP Location: Right Arm)   Pulse 79   Temp 98.4 ?F (36.9 ?C) (Oral)   Resp 18   Ht 5\' 7"  (1.702 m)   Wt (!) 147.4 kg   SpO2 98%   BMI 50.90 kg/m?  ?Physical Exam ?Vitals and nursing note reviewed.  ?Constitutional:   ?   General: She is not in acute distress. ?   Appearance: Normal appearance.  ?HENT:  ?   Head: Normocephalic.   ?Cardiovascular:  ?   Rate and Rhythm: Normal rate.  ?   Pulses: Normal pulses.  ?Pulmonary:  ?   Effort: Pulmonary effort is normal.  ?Musculoskeletal:     ?   General: Tenderness present.  ?   Left ankle: Tenderness present over the medial malleolus and base of 5th metatarsal. No proximal fibula tenderness. Decreased range of motion.  ?   Left Achilles Tendon: Normal.  ?     Feet: ? ?Skin: ?   General: Skin is warm and dry.  ?Neurological:  ?   Mental Status: She is alert. Mental status is at baseline.  ?Psychiatric:     ?   Mood and Affect: Mood normal.  ? ? ?ED Results / Procedures / Treatments   ?Labs ?(all labs ordered are listed, but only abnormal results are displayed) ?Labs Reviewed - No data to display ? ?EKG ?None ? ?Radiology ?DG Ankle Complete Left ? ?Result Date: 04/21/2021 ?CLINICAL DATA:  The ankle pain. EXAM: LEFT ANKLE COMPLETE - 3+ VIEW COMPARISON:  None. FINDINGS: There is no evidence of fracture, dislocation, or joint effusion. There is no evidence of arthropathy or other focal bone abnormality. Soft tissues are unremarkable. IMPRESSION: Negative. Electronically Signed   By: 06/21/2021.D.  On: 04/21/2021 15:35   ? ?Procedures ?Procedures  ? ? ?Medications Ordered in ED ?Medications - No data to display ? ?ED Course/ Medical Decision Making/ A&P ?  ?                        ?Medical Decision Making ?Amount and/or Complexity of Data Reviewed ?Radiology: ordered and independent interpretation performed. Decision-making details documented in ED Course. ? ?Risk ?Prescription drug management. ? ? ?Patient is a 36 year old female presenting today with foot and ankle pain that started suddenly last night.  She felt a pop while she was walking and has had pain ever since.  She was not wearing heels did not step on uneven pavement or no other specific cause.  She reports for her second job she is on her feet all the time but her normal job during the day she sits.  She has some mild swelling on exam  and tenderness in the medial malleolus and fifth metatarsal region.  Concern for most likely ligamentous injury.  I independently visualized and interpreted patient's x-ray does not appear to have acute fracture.  Radiology read as negative.  However given patient's significant amount of pain and discomfort we will treat with supportive care and immobilization.  We will have her follow-up with sports medicine if not improving. ? ? ? ? ? ? ? ? ?Final Clinical Impression(s) / ED Diagnoses ?Final diagnoses:  ?Foot sprain, left, initial encounter  ? ? ?Rx / DC Orders ?ED Discharge Orders   ? ?      Ordered  ?  naproxen (NAPROSYN) 500 MG tablet  2 times daily       ? 04/21/21 1609  ? ?  ?  ? ?  ? ? ?  ?Gwyneth Sprout, MD ?04/21/21 1610 ? ?

## 2021-04-21 NOTE — ED Triage Notes (Signed)
Pt arrives to ED with c/o left ankle pain. This started yesterday. Pt reports she was walking and suddenly heard a"pop" in her left foot.  ?

## 2021-04-21 NOTE — Discharge Instructions (Addendum)
Use the anti-inflammatories.  Elevate and ice when you can.  Avoid prolonged standing. ?

## 2021-05-12 ENCOUNTER — Encounter (INDEPENDENT_AMBULATORY_CARE_PROVIDER_SITE_OTHER): Payer: Self-pay | Admitting: Physician Assistant

## 2021-05-12 ENCOUNTER — Other Ambulatory Visit: Payer: Self-pay

## 2021-05-12 ENCOUNTER — Ambulatory Visit (INDEPENDENT_AMBULATORY_CARE_PROVIDER_SITE_OTHER): Payer: BC Managed Care – PPO | Admitting: Physician Assistant

## 2021-05-12 VITALS — BP 124/80 | HR 88 | Temp 98.1°F | Ht 67.0 in | Wt 335.0 lb

## 2021-05-12 DIAGNOSIS — E559 Vitamin D deficiency, unspecified: Secondary | ICD-10-CM

## 2021-05-12 DIAGNOSIS — E669 Obesity, unspecified: Secondary | ICD-10-CM | POA: Diagnosis not present

## 2021-05-12 DIAGNOSIS — Z6841 Body Mass Index (BMI) 40.0 and over, adult: Secondary | ICD-10-CM

## 2021-05-12 DIAGNOSIS — R7303 Prediabetes: Secondary | ICD-10-CM | POA: Diagnosis not present

## 2021-05-12 DIAGNOSIS — Z9189 Other specified personal risk factors, not elsewhere classified: Secondary | ICD-10-CM

## 2021-05-12 MED ORDER — WEGOVY 0.25 MG/0.5ML ~~LOC~~ SOAJ: 0.2500 mg | SUBCUTANEOUS | 0 refills | Status: DC

## 2021-05-14 NOTE — Progress Notes (Signed)
? ? ? ?Chief Complaint:  ? ?OBESITY ?Katrina Carlson is here to discuss her progress with her obesity treatment plan along with follow-up of her obesity related diagnoses. [name] is on the Category 3 Plan and the Category 4 Plan and states she is following her eating plan approximately 50% of the time. [name] states she is doing 0 minutes 0 times per week. ? ?Today's visit was #: 16 ?Starting weight: 331 lbs ?Starting date: 04/29/2019 ?Today's weight: 335 lbs ?Today's date: 05/12/2021 ?Total lbs lost to date: 0 ?Total lbs lost since last in-office visit: 0 ? ?Interim History: Katrina Carlson tore a ligament in her foot and has not been exercising. She has not been cooking and has been snacking and eating out more often.  ? ?Subjective:  ? ?1. Pre-diabetes ?Katrina Carlson is currently not on medications. Her last A1C was 6.0. ? ?2. Vitamin D deficiency ?Katrina Carlson's last Vitamin D was 1.9. She is not on supplementation currently. She reports that the 50,000 units Vitamin D made her have extra menstrual cycles and so she changed to gummies.  ? ?3. At risk for diabetes mellitus.  ?Katrina Carlson is at higher than average risk for developing diabetes due to her obesity.  ? ?Assessment/Plan:  ? ?1. Pre-diabetes ?Nashay will continue the plan. She will continue to work on weight loss, exercise, and decreasing simple carbohydrates to help decrease the risk of diabetes. She agrees to start Wegovy 0.25 mg for 1 month with no refills. We discussed labs today. ? ?- Semaglutide-Weight Management (WEGOVY) 0.25 MG/0.5ML SOAJ; Inject 0.25 mg into the skin once a week.  Dispense: 2 mL; Refill: 0 ? ?2. Vitamin D deficiency ?Low Vitamin D level contributes to fatigue and are associated with obesity, breast, and colon cancer.Katrina Carlson agrees to continue taking Vitamin D gummies and she will follow-up for routine testing of Vitamin D, at least 2-3 times per year to avoid over-replacement. We discussed labs today.  ? ?3. At risk for diabetes mellitus ?[name] was given  approximately 15 minutes of diabetic education and counseling today. We discussed intensive lifestyle modifications today with an emphasis on weight loss as well as increasing exercise and decreasing simple carbohydrates in her diet. We also reviewed medication options with an emphasis on risk versus benefits of those discussed. ? ?Repetitive spaced learning was employed today to elicit superior memory formation and behavioral change.  ? ?4. Obesity, current BMI 52.46 ?Katrina Carlson is currently in the action stage of change. As such, her goal is to continue with weight loss efforts. She has agreed to the Category 3 Plan and keeping a food journal and adhering to recommended goals of 1400-1600 calories and 120 grams of protein daily.  ? ?Katrina Carlson agrees to start Wegovy 0.25 mg for 1 month with no refills.  ? ?- Semaglutide-Weight Management (WEGOVY) 0.25 MG/0.5ML SOAJ; Inject 0.25 mg into the skin once a week.  Dispense: 2 mL; Refill: 0 ? ?Exercise goals: No exercise has been prescribed at this time. ? ?Behavioral modification strategies: increasing lean protein intake, decreasing simple carbohydrates, and no skipping meals. ? ?Katrina Carlson has agreed to follow-up with our clinic in 3-4 weeks. She was informed of the importance of frequent follow-up visits to maximize her success with intensive lifestyle modifications for her multiple health conditions.  ? ?Objective:  ? ?Blood pressure 124/80, pulse 88, temperature 98.1 ?F (36.7 ?C), height 5\' 7"  (1.702 m), weight (!) 335 lb (152 kg), SpO2 94 %. ?Body mass index is 52.47 kg/m?. ? ?General: Cooperative, alert, well developed, in no  acute distress. ?HEENT: Conjunctivae and lids unremarkable. ?Cardiovascular: Regular rhythm.  ?Lungs: Normal work of breathing. ?Neurologic: No focal deficits.  ? ?Lab Results  ?Component Value Date  ? CREATININE 0.65 02/01/2021  ? BUN 10 02/01/2021  ? NA 140 02/01/2021  ? K 4.4 02/01/2021  ? CL 104 02/01/2021  ? CO2 22 02/01/2021  ? ?Lab Results   ?Component Value Date  ? ALT 15 02/01/2021  ? AST 16 02/01/2021  ? ALKPHOS 64 02/01/2021  ? BILITOT 0.3 02/01/2021  ? ?Lab Results  ?Component Value Date  ? HGBA1C 6.0 (H) 02/01/2021  ? HGBA1C 6.0 (H) 05/26/2020  ? HGBA1C 5.6 09/23/2019  ? ?Lab Results  ?Component Value Date  ? INSULIN 104.0 (H) 02/01/2021  ? INSULIN 70.2 (H) 05/26/2020  ? INSULIN 166.0 (H) 09/23/2019  ? ?Lab Results  ?Component Value Date  ? TSH 1.190 04/29/2019  ? ?Lab Results  ?Component Value Date  ? CHOL 159 02/01/2021  ? HDL 40 02/01/2021  ? LDLCALC 103 (H) 02/01/2021  ? TRIG 87 02/01/2021  ? CHOLHDL 4.0 02/01/2021  ? ?Lab Results  ?Component Value Date  ? VD25OH 12.9 (L) 02/01/2021  ? VD25OH 12.7 (L) 05/26/2020  ? VD25OH 23.1 (L) 09/23/2019  ? ?Lab Results  ?Component Value Date  ? WBC 9.6 05/26/2020  ? HGB 15.1 05/26/2020  ? HCT 44.9 05/26/2020  ? MCV 93 05/26/2020  ? PLT 343 05/26/2020  ? ?Lab Results  ?Component Value Date  ? IRON 54 05/26/2020  ? TIBC 318 05/26/2020  ? FERRITIN 77 05/26/2020  ? ?Attestation Statements:  ? ?Reviewed by clinician on day of visit: allergies, medications, problem list, medical history, surgical history, family history, social history, and previous encounter notes. ? ?I, Tonye Pearson, am acting as Location manager for [employer], PA-C. ? ?I have reviewed the above documentation for accuracy and completeness, and I agree with the above. Abby Potash, PA-C ? ?

## 2021-05-18 ENCOUNTER — Encounter (INDEPENDENT_AMBULATORY_CARE_PROVIDER_SITE_OTHER): Payer: Self-pay

## 2021-05-18 ENCOUNTER — Telehealth (INDEPENDENT_AMBULATORY_CARE_PROVIDER_SITE_OTHER): Payer: Self-pay | Admitting: Physician Assistant

## 2021-05-18 NOTE — Telephone Encounter (Signed)
Prior authorization approved for Wegovy. Effective: 05/17/2021 - 12/17/2021. Patient sent approval message via mychart. ?

## 2021-06-08 ENCOUNTER — Ambulatory Visit (INDEPENDENT_AMBULATORY_CARE_PROVIDER_SITE_OTHER): Payer: BC Managed Care – PPO | Admitting: Physician Assistant

## 2021-06-26 IMAGING — DX DG CHEST 1V PORT
1 series · 1 of 1 positions shown · non-contrast
Comparison: 11/24/2018, 05/29/2016

CLINICAL DATA: Hypoxia

EXAM:
PORTABLE CHEST 1 VIEW

[chest]
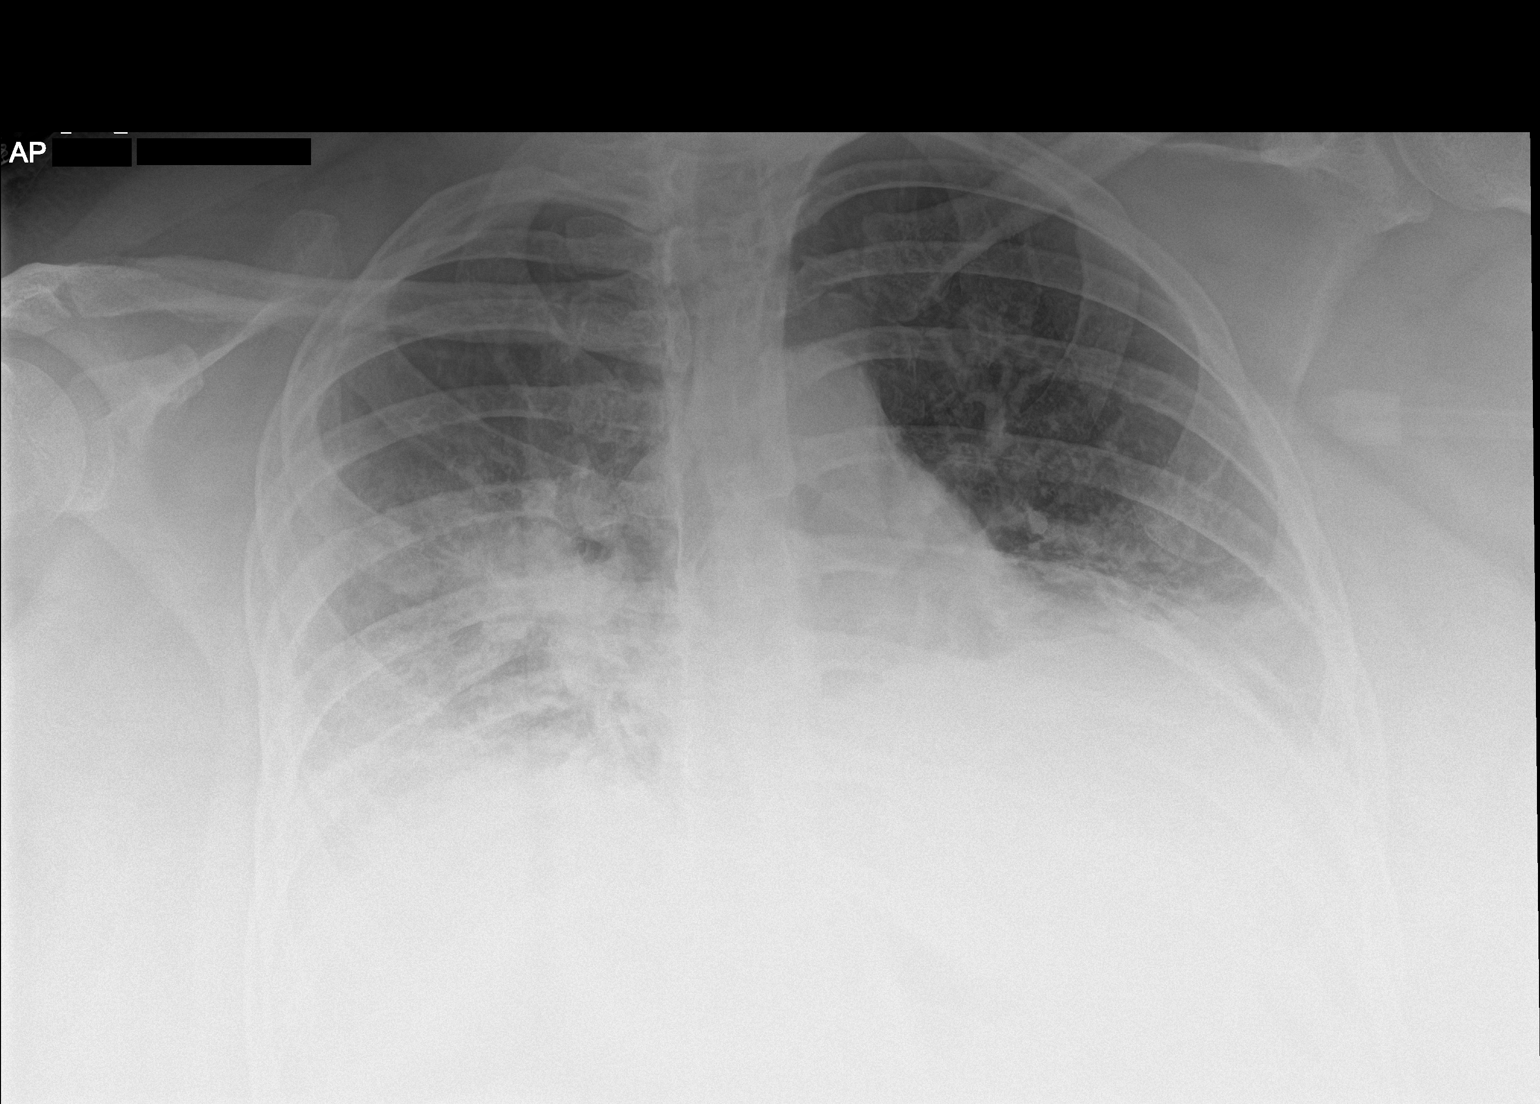

[1 of 1 positions shown; findings below may reference images not displayed]

FINDINGS: Low lung volumes. Worsening of bilateral lower lung airspace
opacities. Stable cardiomediastinal silhouette. No pneumothorax.
IMPRESSION: Low lung volumes with interval worsening of bilateral lower lung
airspace disease/suspected pneumonia

## 2021-07-05 ENCOUNTER — Ambulatory Visit (INDEPENDENT_AMBULATORY_CARE_PROVIDER_SITE_OTHER): Payer: BC Managed Care – PPO | Admitting: Physician Assistant

## 2021-07-07 ENCOUNTER — Ambulatory Visit (INDEPENDENT_AMBULATORY_CARE_PROVIDER_SITE_OTHER): Payer: BC Managed Care – PPO | Admitting: Family Medicine

## 2021-07-07 ENCOUNTER — Ambulatory Visit (INDEPENDENT_AMBULATORY_CARE_PROVIDER_SITE_OTHER): Payer: BC Managed Care – PPO | Admitting: Adult Health

## 2021-07-07 ENCOUNTER — Encounter (INDEPENDENT_AMBULATORY_CARE_PROVIDER_SITE_OTHER): Payer: Self-pay | Admitting: Family Medicine

## 2021-07-07 VITALS — BP 138/82 | HR 89 | Temp 97.8°F | Ht 67.0 in | Wt 334.0 lb

## 2021-07-07 DIAGNOSIS — E559 Vitamin D deficiency, unspecified: Secondary | ICD-10-CM | POA: Diagnosis not present

## 2021-07-07 DIAGNOSIS — R7303 Prediabetes: Secondary | ICD-10-CM | POA: Diagnosis not present

## 2021-07-07 DIAGNOSIS — E669 Obesity, unspecified: Secondary | ICD-10-CM

## 2021-07-07 DIAGNOSIS — K760 Fatty (change of) liver, not elsewhere classified: Secondary | ICD-10-CM

## 2021-07-07 DIAGNOSIS — Z6841 Body Mass Index (BMI) 40.0 and over, adult: Secondary | ICD-10-CM

## 2021-07-07 DIAGNOSIS — Z9189 Other specified personal risk factors, not elsewhere classified: Secondary | ICD-10-CM

## 2021-07-07 MED ORDER — WEGOVY 0.5 MG/0.5ML ~~LOC~~ SOAJ: 0.5000 mg | SUBCUTANEOUS | 0 refills | Status: DC

## 2021-07-07 MED ORDER — VITAMIN D3 ADULT GUMMIES 25 MCG (1000 UT) PO CHEW: CHEWABLE_TABLET | ORAL | Status: AC

## 2021-07-08 LAB — COMPREHENSIVE METABOLIC PANEL
ALT: 26 IU/L (ref 0–32)
AST: 31 IU/L (ref 0–40)
Albumin/Globulin Ratio: 1.1 — ABNORMAL LOW (ref 1.2–2.2)
Albumin: 3.9 g/dL (ref 3.8–4.8)
Alkaline Phosphatase: 55 IU/L (ref 44–121)
BUN/Creatinine Ratio: 11 (ref 9–23)
BUN: 8 mg/dL (ref 6–20)
Bilirubin Total: 0.7 mg/dL (ref 0.0–1.2)
CO2: 21 mmol/L (ref 20–29)
Calcium: 8.7 mg/dL (ref 8.7–10.2)
Chloride: 103 mmol/L (ref 96–106)
Creatinine, Ser: 0.71 mg/dL (ref 0.57–1.00)
Globulin, Total: 3.7 g/dL (ref 1.5–4.5)
Glucose: 100 mg/dL — ABNORMAL HIGH (ref 70–99)
Potassium: 4.4 mmol/L (ref 3.5–5.2)
Sodium: 138 mmol/L (ref 134–144)
Total Protein: 7.6 g/dL (ref 6.0–8.5)
eGFR: 114 mL/min/{1.73_m2} (ref >59)

## 2021-07-08 LAB — HEMOGLOBIN A1C
Est. average glucose Bld gHb Est-mCnc: 128 mg/dL
Hgb A1c MFr Bld: 6.1 % — ABNORMAL HIGH (ref 4.8–5.6)

## 2021-07-08 LAB — INSULIN, RANDOM: INSULIN: 93 u[IU]/mL — ABNORMAL HIGH (ref 2.6–24.9)

## 2021-07-14 ENCOUNTER — Telehealth (INDEPENDENT_AMBULATORY_CARE_PROVIDER_SITE_OTHER): Payer: Self-pay

## 2021-07-14 NOTE — Telephone Encounter (Signed)
Patient called back and states they only have one in stock and would like it sent in before we close.

## 2021-07-14 NOTE — Telephone Encounter (Signed)
Last OV with Dr Opalski 

## 2021-07-14 NOTE — Telephone Encounter (Signed)
Patient called states her regular pharmacy is out out  Semaglutide-Weight Management (WEGOVY) 0.5 MG/0.5ML SOAJ Would like it sent to :  [facility] - [place], [place] - [address] Phone:  [phone]  Fax:  [phone]

## 2021-07-16 NOTE — Progress Notes (Unsigned)
Chief Complaint:   OBESITY Katrina Carlson is here to discuss her progress with her obesity treatment plan along with follow-up of her obesity related diagnoses. Katrina Carlson is on the Category 3 Plan and states she is following her eating plan approximately 30% of the time. Katrina Carlson states she is walking 15 minutes 1 to 2 times per week.  Today's visit was #: 17 Starting weight: 331 lbs Starting date: 04/29/2019 Today's weight: 334 lbs Today's date: 07/07/2021 Total lbs lost to date: 0 Total lbs lost since last in-office visit: 1  Interim History: Katrina Carlson was last to follow up as Katrina Carlson saw her on 3/23 and told her to follow up in 3 weeks.This is her 1st office visit with me and prior she had seen Dr. Earlene Plater. Kellie is not really following much of a meal plan currently.  Subjective:   1. Pre-diabetes Katrina Carlson started [place] at her last office visit approximately 5 weeks ago with Katrina Carlson. Katrina Carlson is tolerating medication well without side effects and had a minimal decrease in her appetite.   2. Vitamin D deficiency Katrina Carlson is not taking weekly vitamin D, but she is taking OTC vitamin D 2,00IU every day for 4 weeks only now.  3. NAFLD (nonalcoholic fatty liver disease) Katrina Carlson's ALT was 34 about 2 years ago. She has not had weight loss since then.  4. At risk for side effect of medication Aryiel is at risk of drug side effects due to increasing the dose of her GLP-1.  Assessment/Plan:   Orders Placed This Encounter  Procedures   Insulin, random   Hemoglobin A1c   Comprehensive metabolic panel    Medications Discontinued During This Encounter  Medication Reason   Semaglutide-Weight Management (WEGOVY) 0.25 MG/0.5ML SOAJ    Cholecalciferol (VITAMIN D3 ADULT GUMMIES) 25 MCG (1000 UT) CHEW Reorder     Meds ordered this encounter  Medications   Semaglutide-Weight Management (WEGOVY) 0.5 MG/0.5ML SOAJ    Sig: Inject 0.5 mg into the skin once a week.    Dispense:  2 mL    Refill:  0    Cholecalciferol (VITAMIN D3 ADULT GUMMIES) 25 MCG (1000 UT) CHEW    Sig: 5,000 IU qd weekdays, and 10K IU on weekend    Dispense:  60 tablet     1. Pre-diabetes Katrina Carlson agrees to increase Wegovy from 0.25 to 0.5mg  weekly. An A1c, fasting insulin, and CMP will be obtained today. Ghislaine agrees to continue to follow her meal plan.  - Semaglutide-Weight Management (WEGOVY) 0.5 MG/0.5ML SOAJ; Inject 0.5 mg into the skin once a week.  Dispense: 2 mL; Refill: 0 - Insulin, random - Hemoglobin A1c  2. Vitamin D deficiency Katrina Carlson agrees to increase her vitamin D to 5,000IU daily and to take 10,000IU everyday on Saturday and Sunday. We will recheck in 3 to 4 months.  - Cholecalciferol (VITAMIN D3 ADULT GUMMIES) 25 MCG (1000 UT) CHEW; 5,000 IU qd weekdays, and 10K IU on weekend  Dispense: 60 tablet  3. NAFLD (nonalcoholic fatty liver disease) We will check Katrina Carlson's CMP today and she agrees to follow up as directed.  - Comprehensive metabolic panel  4. At risk for side effect of medication Due to Katrina Carlson's current conditions and medications, she is at a higher risk for drug side effect. At least 9 minutes was spent on counseling her about these concerns today. We discussed the benefits and potential risks of these medications, and all of patient's concerns were addressed and questions were answered. She will call us,  or their PCP or other specialists who treat their conditions with medications, with any questions or concerns that may develop.     5. Obesity, Current BMI 52.4 Katrina Carlson's last IC was over 2 years ago. We will recheck her IC at her next office visit as she needs to come in 30 minutes prior. We will increase her Wegovy dose and she agrees to bring in her food log.  Katrina Carlson is currently in the action stage of change. As such, her goal is to continue with weight loss efforts. She has agreed to keeping a food journal and adhering to recommended goals of 1500 to 1600 calories and 150+  grams of protein daily.  Exercise goals:  As is.  Behavioral modification strategies: increasing water intake, decreasing liquid calories (no soda or juice), and keeping a strict food journal.  Katrina Carlson has agreed to follow-up with our clinic in 4 weeks. She was informed of the importance of frequent follow-up visits to maximize her success with intensive lifestyle modifications for her multiple health conditions.   Katrina Carlson was informed we would discuss her lab results at her next visit unless there is a critical issue that needs to be addressed sooner. Katrina Carlson agreed to keep her next visit at the agreed upon time to discuss these results.  Objective:   Blood pressure 138/82, pulse 89, temperature 97.8 F (36.6 C), height 5\' 7"  (1.702 m), weight (!) 334 lb (151.5 kg), SpO2 95 %. Body mass index is 52.31 kg/m.  General: Cooperative, alert, well developed, in no acute distress. HEENT: Conjunctivae and lids unremarkable. Cardiovascular: Regular rhythm.  Lungs: Normal work of breathing. Neurologic: No focal deficits.   Lab Results  Component Value Date   CREATININE 0.71 07/07/2021   BUN 8 07/07/2021   NA 138 07/07/2021   K 4.4 07/07/2021   CL 103 07/07/2021   CO2 21 07/07/2021   Lab Results  Component Value Date   ALT 26 07/07/2021   AST 31 07/07/2021   ALKPHOS 55 07/07/2021   BILITOT 0.7 07/07/2021   Lab Results  Component Value Date   HGBA1C 6.1 (H) 07/07/2021   HGBA1C 6.0 (H) 02/01/2021   HGBA1C 6.0 (H) 05/26/2020   HGBA1C 5.6 09/23/2019   Lab Results  Component Value Date   INSULIN 93.0 (H) 07/07/2021   INSULIN 104.0 (H) 02/01/2021   INSULIN 70.2 (H) 05/26/2020   INSULIN 166.0 (H) 09/23/2019   Lab Results  Component Value Date   TSH 1.190 04/29/2019   Lab Results  Component Value Date   CHOL 159 02/01/2021   HDL 40 02/01/2021   LDLCALC 103 (H) 02/01/2021   TRIG 87 02/01/2021   CHOLHDL 4.0 02/01/2021   Lab Results  Component Value Date   VD25OH 12.9  (L) 02/01/2021   VD25OH 12.7 (L) 05/26/2020   VD25OH 23.1 (L) 09/23/2019   Lab Results  Component Value Date   WBC 9.6 05/26/2020   HGB 15.1 05/26/2020   HCT 44.9 05/26/2020   MCV 93 05/26/2020   PLT 343 05/26/2020   Lab Results  Component Value Date   IRON 54 05/26/2020   TIBC 318 05/26/2020   FERRITIN 77 05/26/2020   Attestation Statements:   Reviewed by clinician on day of visit: allergies, medications, problem list, medical history, surgical history, family history, social history, and previous encounter notes.  IMarcille Blanco, CMA, am acting as transcriptionist for [employer], DO  I have reviewed the above documentation for accuracy and completeness, and I agree with the  above. Marjory Sneddon, D.O.  The [place] was signed into law in 2016 which includes the topic of electronic health records.  This provides immediate access to information in MyChart.  This includes consultation notes, operative notes, office notes, lab results and pathology reports.  If you have any questions about what you read please let us know at your next visit so we can discuss your concerns and take corrective action if need be.  We are right here with you.

## 2021-07-25 ENCOUNTER — Telehealth (INDEPENDENT_AMBULATORY_CARE_PROVIDER_SITE_OTHER): Payer: Self-pay | Admitting: Nurse Practitioner

## 2021-07-25 NOTE — Telephone Encounter (Signed)
Pt called to ask about why she has to do the "leg work" of running between the pharmacies to see who has wegovy in stock. Sent to [facility] and pt having difficult time getting medication. Pt states she has been trying to get the medication since 07/08/21.    Next appt with Judeth Cornfield on 6/13

## 2021-07-25 NOTE — Telephone Encounter (Signed)
Dr.Opalski ?

## 2021-07-25 NOTE — Telephone Encounter (Signed)
called pt, vm full, could not leave message

## 2021-08-01 ENCOUNTER — Telehealth (INDEPENDENT_AMBULATORY_CARE_PROVIDER_SITE_OTHER): Payer: Self-pay

## 2021-08-02 ENCOUNTER — Encounter (INDEPENDENT_AMBULATORY_CARE_PROVIDER_SITE_OTHER): Payer: Self-pay | Admitting: Nurse Practitioner

## 2021-08-02 ENCOUNTER — Ambulatory Visit (INDEPENDENT_AMBULATORY_CARE_PROVIDER_SITE_OTHER): Payer: BC Managed Care – PPO | Admitting: Nurse Practitioner

## 2021-08-02 VITALS — BP 139/90 | HR 98 | Temp 97.9°F | Ht 67.0 in | Wt 337.0 lb

## 2021-08-02 DIAGNOSIS — R0609 Other forms of dyspnea: Secondary | ICD-10-CM | POA: Diagnosis not present

## 2021-08-02 DIAGNOSIS — E669 Obesity, unspecified: Secondary | ICD-10-CM | POA: Diagnosis not present

## 2021-08-02 DIAGNOSIS — K760 Fatty (change of) liver, not elsewhere classified: Secondary | ICD-10-CM | POA: Diagnosis not present

## 2021-08-02 DIAGNOSIS — R7303 Prediabetes: Secondary | ICD-10-CM | POA: Diagnosis not present

## 2021-08-02 DIAGNOSIS — Z7985 Long-term (current) use of injectable non-insulin antidiabetic drugs: Secondary | ICD-10-CM

## 2021-08-02 DIAGNOSIS — Z6841 Body Mass Index (BMI) 40.0 and over, adult: Secondary | ICD-10-CM

## 2021-08-02 MED ORDER — SAXENDA 18 MG/3ML ~~LOC~~ SOPN: 3.0000 mg | PEN_INJECTOR | Freq: Every day | SUBCUTANEOUS | 0 refills | Status: DC

## 2021-08-02 NOTE — Telephone Encounter (Signed)
See my chart message
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
## 2021-08-04 ENCOUNTER — Telehealth (INDEPENDENT_AMBULATORY_CARE_PROVIDER_SITE_OTHER): Payer: Self-pay

## 2021-08-04 NOTE — Telephone Encounter (Signed)
Prior authorization started for Saxenda. Will notify patient and provider once response is received.  

## 2021-08-04 NOTE — Telephone Encounter (Signed)
Possible need for prior auth for saxenda.  Pt is calling saying that the pharmacy is asking for an approval.

## 2021-08-09 ENCOUNTER — Encounter (INDEPENDENT_AMBULATORY_CARE_PROVIDER_SITE_OTHER): Payer: Self-pay

## 2021-08-09 ENCOUNTER — Telehealth (INDEPENDENT_AMBULATORY_CARE_PROVIDER_SITE_OTHER): Payer: Self-pay | Admitting: Nurse Practitioner

## 2021-08-09 NOTE — Telephone Encounter (Signed)
Katrina Carlson - Prior authorization approved for Saxenda. Effective: 08/04/2021 -12/04/2021. Patient sent approval message via mychart.

## 2021-08-24 ENCOUNTER — Ambulatory Visit (INDEPENDENT_AMBULATORY_CARE_PROVIDER_SITE_OTHER): Payer: BC Managed Care – PPO | Admitting: Nurse Practitioner

## 2021-08-24 ENCOUNTER — Encounter (INDEPENDENT_AMBULATORY_CARE_PROVIDER_SITE_OTHER): Payer: Self-pay | Admitting: Nurse Practitioner

## 2021-08-24 VITALS — BP 128/79 | HR 84 | Temp 98.4°F | Ht 67.0 in | Wt 339.0 lb

## 2021-08-24 DIAGNOSIS — R7303 Prediabetes: Secondary | ICD-10-CM | POA: Diagnosis not present

## 2021-08-24 DIAGNOSIS — Z7985 Long-term (current) use of injectable non-insulin antidiabetic drugs: Secondary | ICD-10-CM | POA: Diagnosis not present

## 2021-08-24 DIAGNOSIS — E669 Obesity, unspecified: Secondary | ICD-10-CM

## 2021-08-24 DIAGNOSIS — Z6841 Body Mass Index (BMI) 40.0 and over, adult: Secondary | ICD-10-CM

## 2021-08-24 NOTE — Progress Notes (Signed)
Chief Complaint:   OBESITY Katrina Carlson is here to discuss her progress with her obesity treatment plan along with follow-up of her obesity related diagnoses. Katrina Carlson is on the Category 4 Plan and states she is following her eating plan approximately 60% of the time. Katrina Carlson states she is walking 20 minutes 3 times per week.  Today's visit was #: 19 Starting weight: 331 lbs Starting date: 04/29/2019 Today's weight: 339 lbs Today's date: 08/24/2021 Total lbs lost to date: 0 Total lbs lost since last in-office visit: 0  Interim History: Katrina Carlson is taking Saxenda 0.6 mg, 2 clicks.  She has been forgetting to take it on a regular basis.  Having side effects of dry mouth.  She takes it on the average about 4 days per week.  When she takes [place] she notes it helps with hunger and cravings.  The heat has been discouraging her from walking. She has plans to go out of town in August.    Subjective:   1. Pre-diabetes Ayianna has a diagnosis of prediabetes based on her elevated HgA1c and was informed this puts her at greater risk of developing diabetes. She continues to work on diet and exercise to decrease her risk of diabetes. She denies nausea or hypoglycemia. Katrina Carlson is taking Saxenda 0.6 mg, 2 clicks.  She has been forgetting to take it on a regular basis.  Having side effects of dry mouth.  She takes it on the average about 4 days per week.  When she takes [place] she notes it helps with hunger and cravings.   Last A1c was 6.1 and fasting insulin level was 93.0.   Lab Results  Component Value Date   HGBA1C 6.1 (H) 07/07/2021   Lab Results  Component Value Date   INSULIN 93.0 (H) 07/07/2021   INSULIN 104.0 (H) 02/01/2021   INSULIN 70.2 (H) 05/26/2020   INSULIN 166.0 (H) 09/23/2019   Assessment/Plan:   1. Pre-diabetes Katrina Carlson will continue to work on weight loss, exercise, and decreasing simple carbohydrates to help decrease the risk of diabetes.  Continue Saxenda and encouraged to  take daily.    2. Obesity, Current BMI 53.2 Katrina Carlson is currently in the action stage of change. As such, her goal is to continue with weight loss efforts. She has agreed to keeping a food journal and adhering to recommended goals of 1800-2000 calories and 100+ protein.   Exercise goals:  As is.   Behavioral modification strategies: increasing lean protein intake, increasing water intake, planning for success, and keeping a strict food journal.  Katrina Carlson has agreed to follow-up with our clinic in 3-4 weeks. She was informed of the importance of frequent follow-up visits to maximize her success with intensive lifestyle modifications for her multiple health conditions.   Objective:   Blood pressure 128/79, pulse 84, temperature 98.4 F (36.9 C), height 5\' 7"  (1.702 m), weight (!) 339 lb (153.8 kg), SpO2 95 %. Body mass index is 53.09 kg/m.  General: Cooperative, alert, well developed, in no acute distress. HEENT: Conjunctivae and lids unremarkable. Cardiovascular: Regular rhythm.  Lungs: Normal work of breathing. Neurologic: No focal deficits.   Lab Results  Component Value Date   CREATININE 0.71 07/07/2021   BUN 8 07/07/2021   NA 138 07/07/2021   K 4.4 07/07/2021   CL 103 07/07/2021   CO2 21 07/07/2021   Lab Results  Component Value Date   ALT 26 07/07/2021   AST 31 07/07/2021   ALKPHOS 55 07/07/2021   BILITOT  0.7 07/07/2021   Lab Results  Component Value Date   HGBA1C 6.1 (H) 07/07/2021   HGBA1C 6.0 (H) 02/01/2021   HGBA1C 6.0 (H) 05/26/2020   HGBA1C 5.6 09/23/2019   Lab Results  Component Value Date   INSULIN 93.0 (H) 07/07/2021   INSULIN 104.0 (H) 02/01/2021   INSULIN 70.2 (H) 05/26/2020   INSULIN 166.0 (H) 09/23/2019   Lab Results  Component Value Date   TSH 1.190 04/29/2019   Lab Results  Component Value Date   CHOL 159 02/01/2021   HDL 40 02/01/2021   LDLCALC 103 (H) 02/01/2021   TRIG 87 02/01/2021   CHOLHDL 4.0 02/01/2021   Lab Results  Component  Value Date   VD25OH 12.9 (L) 02/01/2021   VD25OH 12.7 (L) 05/26/2020   VD25OH 23.1 (L) 09/23/2019   Lab Results  Component Value Date   WBC 9.6 05/26/2020   HGB 15.1 05/26/2020   HCT 44.9 05/26/2020   MCV 93 05/26/2020   PLT 343 05/26/2020   Lab Results  Component Value Date   IRON 54 05/26/2020   TIBC 318 05/26/2020   FERRITIN 77 05/26/2020    Attestation Statements:   Reviewed by clinician on day of visit: allergies, medications, problem list, medical history, surgical history, family history, social history, and previous encounter notes.  Time spent on visit including pre-visit chart review and post-visit care and charting was 30 minutes.   I, Malcolm Metro, RMA, am acting as transcriptionist for Irene Limbo, FNP  I have reviewed the above documentation for accuracy and completeness, and I agree with the above. Irene Limbo, FNP

## 2021-09-21 ENCOUNTER — Ambulatory Visit (INDEPENDENT_AMBULATORY_CARE_PROVIDER_SITE_OTHER): Payer: BC Managed Care – PPO | Admitting: Nurse Practitioner

## 2021-09-28 ENCOUNTER — Encounter (INDEPENDENT_AMBULATORY_CARE_PROVIDER_SITE_OTHER): Payer: Self-pay

## 2021-10-18 ENCOUNTER — Ambulatory Visit (INDEPENDENT_AMBULATORY_CARE_PROVIDER_SITE_OTHER): Payer: BC Managed Care – PPO | Admitting: Nurse Practitioner

## 2021-12-14 ENCOUNTER — Encounter: Payer: Self-pay | Admitting: Nurse Practitioner

## 2021-12-14 ENCOUNTER — Ambulatory Visit: Payer: BC Managed Care – PPO | Admitting: Nurse Practitioner

## 2021-12-14 VITALS — BP 158/85 | HR 83 | Temp 97.9°F | Ht 67.0 in | Wt 343.0 lb

## 2021-12-14 DIAGNOSIS — E559 Vitamin D deficiency, unspecified: Secondary | ICD-10-CM

## 2021-12-14 DIAGNOSIS — Z6841 Body Mass Index (BMI) 40.0 and over, adult: Secondary | ICD-10-CM | POA: Diagnosis not present

## 2021-12-14 DIAGNOSIS — Z79899 Other long term (current) drug therapy: Secondary | ICD-10-CM

## 2021-12-14 DIAGNOSIS — Z13220 Encounter for screening for lipoid disorders: Secondary | ICD-10-CM

## 2021-12-14 DIAGNOSIS — E669 Obesity, unspecified: Secondary | ICD-10-CM

## 2021-12-14 DIAGNOSIS — R7303 Prediabetes: Secondary | ICD-10-CM

## 2021-12-16 LAB — CBC WITH DIFFERENTIAL/PLATELET
Basophils Absolute: 0.1 10*3/uL (ref 0.0–0.2)
Basos: 1 %
EOS (ABSOLUTE): 0.2 10*3/uL (ref 0.0–0.4)
Eos: 2 %
Hematocrit: 43.6 % (ref 34.0–46.6)
Hemoglobin: 14.7 g/dL (ref 11.1–15.9)
Immature Grans (Abs): 0 10*3/uL (ref 0.0–0.1)
Immature Granulocytes: 0 %
Lymphocytes Absolute: 2.3 10*3/uL (ref 0.7–3.1)
Lymphs: 25 %
MCH: 30.4 pg (ref 26.6–33.0)
MCHC: 33.7 g/dL (ref 31.5–35.7)
MCV: 90 fL (ref 79–97)
Monocytes Absolute: 0.7 10*3/uL (ref 0.1–0.9)
Monocytes: 8 %
Neutrophils Absolute: 6.1 10*3/uL (ref 1.4–7.0)
Neutrophils: 64 %
Platelets: 323 10*3/uL (ref 150–450)
RBC: 4.83 x10E6/uL (ref 3.77–5.28)
RDW: 12.3 % (ref 11.7–15.4)
WBC: 9.3 10*3/uL (ref 3.4–10.8)

## 2021-12-16 LAB — HEMOGLOBIN A1C
Est. average glucose Bld gHb Est-mCnc: 163 mg/dL
Hgb A1c MFr Bld: 7.3 % — ABNORMAL HIGH (ref 4.8–5.6)

## 2021-12-16 LAB — LIPID PANEL WITH LDL/HDL RATIO
Cholesterol, Total: 144 mg/dL (ref 100–199)
HDL: 34 mg/dL — ABNORMAL LOW (ref >39)
LDL Chol Calc (NIH): 87 mg/dL (ref 0–99)
LDL/HDL Ratio: 2.6 ratio (ref 0.0–3.2)
Triglycerides: 125 mg/dL (ref 0–149)
VLDL Cholesterol Cal: 23 mg/dL (ref 5–40)

## 2021-12-16 LAB — VITAMIN D 25 HYDROXY (VIT D DEFICIENCY, FRACTURES): Vit D, 25-Hydroxy: 8.1 ng/mL — ABNORMAL LOW (ref 30.0–100.0)

## 2021-12-16 LAB — COMPREHENSIVE METABOLIC PANEL
ALT: 20 IU/L (ref 0–32)
AST: 17 IU/L (ref 0–40)
Albumin/Globulin Ratio: 1 — ABNORMAL LOW (ref 1.2–2.2)
Albumin: 3.8 g/dL — ABNORMAL LOW (ref 3.9–4.9)
Alkaline Phosphatase: 66 IU/L (ref 44–121)
BUN/Creatinine Ratio: 14 (ref 9–23)
BUN: 9 mg/dL (ref 6–20)
Bilirubin Total: 0.2 mg/dL (ref 0.0–1.2)
CO2: 21 mmol/L (ref 20–29)
Calcium: 9.1 mg/dL (ref 8.7–10.2)
Chloride: 102 mmol/L (ref 96–106)
Creatinine, Ser: 0.63 mg/dL (ref 0.57–1.00)
Globulin, Total: 3.7 g/dL (ref 1.5–4.5)
Glucose: 169 mg/dL — ABNORMAL HIGH (ref 70–99)
Potassium: 4.2 mmol/L (ref 3.5–5.2)
Sodium: 137 mmol/L (ref 134–144)
Total Protein: 7.5 g/dL (ref 6.0–8.5)
eGFR: 118 mL/min/{1.73_m2} (ref >59)

## 2021-12-16 LAB — INSULIN, RANDOM: INSULIN: 235 u[IU]/mL — ABNORMAL HIGH (ref 2.6–24.9)

## 2021-12-16 LAB — TSH: TSH: 1.7 u[IU]/mL (ref 0.450–4.500)

## 2021-12-19 NOTE — Progress Notes (Signed)
Chief Complaint:   OBESITY Katrina Carlson is here to discuss her progress with her obesity treatment plan along with follow-up of her obesity related diagnoses. Maritta is on the Category 3 Plan and states she is following her eating plan approximately 50% of the time. Tashara states she is doing chair exercises 15 minutes 2 times per week.  Today's visit was #: 20 Starting weight: 331 lbs Starting date: 04/29/2019 Today's weight: 343 lbs Today's date: 12/14/2021 Total lbs lost to date: 0 lbs Total lbs lost since last in-office visit: 0  Interim History: Malavika was seen last on 08/24/2021. Has been a rocky road since her last visit. Her father is in hospice care. She has to travel 45 minutes each way to see him. Her mother had surgery 3 weeks ago and she is traveling more with her job. Has not been able to eat on plan. Notes some stress eating. She has tried [place] in the past. Struggling with some L foot pain, so can not walk like she did in the past. She is drinking water more and sodas.   Subjective:   1. Vitamin D deficiency Justiss is not currently taking Vit D. Took Vit D 50,000 IU in the past but stopped due to side effects.  2. Pre-diabetes Alichia is not currently on medications. Family history: Father and mother. She has never been on Metformin and notes cravings.  3. Medication management Labs obtained today  Assessment/Plan:   1. Vitamin D deficiency We will obtain labs today.  - Comprehensive metabolic panel - VITAMIN D 25 Hydroxy (Vit-D Deficiency, Fractures) - TSH - CBC with Differential/Platelet  2. Pre-diabetes We will obtain labs today.  Delania will continue to work on weight loss, exercise, and decreasing simple carbohydrates to help decrease the risk of diabetes.    - Comprehensive metabolic panel - Hemoglobin A1c - Insulin, random - TSH - CBC with Differential/Platelet  3. Medication management We will obtain labs today.  -  Comprehensive metabolic panel - Hemoglobin A1c - Insulin, random - VITAMIN D 25 Hydroxy (Vit-D Deficiency, Fractures) - Lipid Panel With LDL/HDL Ratio - TSH - CBC with Differential/Platelet  4. Obesity, Current BMI 53.8 Lydia is currently in the action stage of change. As such, her goal is to continue with weight loss efforts. She has agreed to keeping a food journal and adhering to recommended goals of 1800 calories and 90+ grams of protein.   Exercise goals: All adults should avoid inactivity. Some physical activity is better than none, and adults who participate in any amount of physical activity gain some health benefits.  Behavioral modification strategies: increasing lean protein intake, increasing vegetables, and increasing water intake.  Anhar has agreed to follow-up with our clinic in 2 weeks. She was informed of the importance of frequent follow-up visits to maximize her success with intensive lifestyle modifications for her multiple health conditions.   Maddy was informed we would discuss her lab results at her next visit unless there is a critical issue that needs to be addressed sooner. Tinzley agreed to keep her next visit at the agreed upon time to discuss these results.  Objective:   Blood pressure (!) 158/85, pulse 83, temperature 97.9 F (36.6 C), height 5\' 7"  (1.702 m), weight (!) 343 lb (155.6 kg), SpO2 90 %. Body mass index is 53.72 kg/m.  General: Cooperative, alert, well developed, in no acute distress. HEENT: Conjunctivae and lids unremarkable. Cardiovascular: Regular rhythm.  Lungs: Normal work of breathing. Neurologic:  No focal deficits.   Lab Results  Component Value Date   CREATININE 0.63 12/14/2021   BUN 9 12/14/2021   NA 137 12/14/2021   K 4.2 12/14/2021   CL 102 12/14/2021   CO2 21 12/14/2021   Lab Results  Component Value Date   ALT 20 12/14/2021   AST 17 12/14/2021   ALKPHOS 66 12/14/2021   BILITOT 0.2 12/14/2021   Lab Results   Component Value Date   HGBA1C 7.3 (H) 12/14/2021   HGBA1C 6.1 (H) 07/07/2021   HGBA1C 6.0 (H) 02/01/2021   HGBA1C 6.0 (H) 05/26/2020   HGBA1C 5.6 09/23/2019   Lab Results  Component Value Date   INSULIN 235.0 (H) 12/14/2021   INSULIN 93.0 (H) 07/07/2021   INSULIN 104.0 (H) 02/01/2021   INSULIN 70.2 (H) 05/26/2020   INSULIN 166.0 (H) 09/23/2019   Lab Results  Component Value Date   TSH 1.700 12/14/2021   Lab Results  Component Value Date   CHOL 144 12/14/2021   HDL 34 (L) 12/14/2021   LDLCALC 87 12/14/2021   TRIG 125 12/14/2021   CHOLHDL 4.0 02/01/2021   Lab Results  Component Value Date   VD25OH 8.1 (L) 12/14/2021   VD25OH 12.9 (L) 02/01/2021   VD25OH 12.7 (L) 05/26/2020   Lab Results  Component Value Date   WBC 9.3 12/14/2021   HGB 14.7 12/14/2021   HCT 43.6 12/14/2021   MCV 90 12/14/2021   PLT 323 12/14/2021   Lab Results  Component Value Date   IRON 54 05/26/2020   TIBC 318 05/26/2020   FERRITIN 77 05/26/2020   Attestation Statements:   Reviewed by clinician on day of visit: allergies, medications, problem list, medical history, surgical history, family history, social history, and previous encounter notes.  I spent 30 minutes with the patient and reviewing her chart before and after her visit.    I, Brendell Tyus, RMA, am acting as transcriptionist for Irene Limbo, FNP.  I have reviewed the above documentation for accuracy and completeness, and I agree with the above. Irene Limbo, FNP

## 2022-01-02 ENCOUNTER — Ambulatory Visit: Payer: BC Managed Care – PPO | Admitting: Nurse Practitioner

## 2022-01-02 ENCOUNTER — Encounter: Payer: Self-pay | Admitting: Nurse Practitioner

## 2022-01-02 VITALS — BP 146/100 | HR 87 | Temp 98.1°F | Ht 67.0 in | Wt 343.0 lb

## 2022-01-02 DIAGNOSIS — E119 Type 2 diabetes mellitus without complications: Secondary | ICD-10-CM

## 2022-01-02 DIAGNOSIS — R030 Elevated blood-pressure reading, without diagnosis of hypertension: Secondary | ICD-10-CM

## 2022-01-02 DIAGNOSIS — Z6841 Body Mass Index (BMI) 40.0 and over, adult: Secondary | ICD-10-CM

## 2022-01-02 DIAGNOSIS — E669 Obesity, unspecified: Secondary | ICD-10-CM | POA: Diagnosis not present

## 2022-01-02 DIAGNOSIS — E559 Vitamin D deficiency, unspecified: Secondary | ICD-10-CM | POA: Diagnosis not present

## 2022-01-02 MED ORDER — TIRZEPATIDE 2.5 MG/0.5ML ~~LOC~~ SOAJ: 2.5000 mg | SUBCUTANEOUS | 0 refills | Status: DC

## 2022-01-02 MED ORDER — ONDANSETRON HCL 4 MG PO TABS: 4.0000 mg | ORAL_TABLET | Freq: Three times a day (TID) | ORAL | 0 refills | Status: DC | PRN

## 2022-01-02 NOTE — Patient Instructions (Signed)
What is a GLP-1 Glucagon like peptide-1 (GLP-1) agonists represent a class of medications used to treat type 2 diabetes mellitus and obesity.  GLP-1 medications mimic the action of a hormone called glucagon like peptide 1.  When blood sugar levels start to rise/increase these drugs stimulate the body to produce more insulin.  When that happens, the extra insulin helps to lower the blood sugar levels in the body.  This in returns helps with decreasing cravings.  These medications also slow the movement of food from the stomach into the small intestine.  This in return helps one to full faster and longer.   Diabetic medications: Approved for treatment of diabetes mellitus but does not have full approval for weight loss use Victoza (liraglutide) Ozempic (semaglutide) Mounjaro Trulicity Rybelsus  Weight loss medications: Approved for long-term weight loss use.        Saxenda (liraglutide) Wegovy (semaglutide)  Contraindications:  Pancreatitis (active gallstones) Medullary thyroid cancer High triglycerides (>500)-will need labs prior to starting Multiple Endocrine Neoplasia syndrome type 2 (MEN 2) Trying to get pregnant Breastfeeding Use with caution with taking insulin or sulfonylureas (will need to monitor blood sugars for hypoglycemia)  Side effects (most common): Most common side effects are nausea, gas, bloating and constipation.  Other possible side effects are headaches, belching, diarrhea, tiredness (fatigue), vomiting, upset stomach, dizziness, heartburn and stomach (abdominal pain).  If you think that you are becoming dehydrated, please inform our office or your primary family provider.  Stop immediately and go to ER if you have any symptoms of a serious allergic reaction including swelling of your face, lips, tongue or throat; problems breathing or swallowing; severe rash or itching; fainting or feeling dizzy; or very rapid heart rate.                                                                                              

## 2022-01-04 ENCOUNTER — Telehealth: Payer: Self-pay

## 2022-01-04 MED ORDER — OZEMPIC (0.25 OR 0.5 MG/DOSE) 2 MG/3ML ~~LOC~~ SOPN: 0.2500 mg | PEN_INJECTOR | SUBCUTANEOUS | 0 refills | Status: DC

## 2022-01-04 NOTE — Telephone Encounter (Signed)
-----   Message from Irene Limbo, FNP sent at 01/02/2022  3:48 PM EST ----- Katrina Carlson is not covered by her insurance.  Will you call her and see if she wants to take Ozempic instead.

## 2022-01-04 NOTE — Telephone Encounter (Signed)
I gave message to pt below. She agreed with Ozempic being sent to pharmacy.

## 2022-01-05 NOTE — Progress Notes (Unsigned)
Chief Complaint:   OBESITY Katrina Carlson is here to discuss her progress with her obesity treatment plan along with follow-up of her obesity related diagnoses. Katrina Carlson is on the Category 3 Plan and the Category 4 Plan and states she is following her eating plan approximately 0% of the time. Katrina Carlson states she is walking 15 minutes 2 times per week.  Today's visit was #: 21 Starting weight: 331 lbs Starting date: 04/29/2019 Today's weight: 343 lbs Today's date: 01/02/2022 Total lbs lost to date: 0 lbs Total lbs lost since last in-office visit: 0  Interim History: Katrina Carlson is happy that she maintained her weight since last visit. Her stress has gotten better at work. She would like to loss weight and continue working on her health goals to get pregnant in the future. She notes stress eating. Working on drinking more water. Has not been able to exercise due to foot pain.  She is scheduled for an MRI on her foot tomorrow.  Subjective:   1. Type 2 diabetes mellitus without complication, without long-term current use of insulin (HCC) Labs discussed during visit today. Katrina Carlson's family history: Mother and father DMT2. She has never been on medications. This is new diagnoses.  2. Vitamin D deficiency Labs discussed during visit today. Katrina Carlson has taken Vit D in the past but not currently taking any. Stopped Vit D 50,000 IU due to side effects.  3. Elevated BP without diagnosis of hypertension Labs discussed during visit today. Katrina Carlson's blood pressure is elevated today. Denies any chest pain,shortness of breath or palpitations. Reports SOB with exertion but not getting worse. Family history: father hypertension. She has never been on medications.  Assessment/Plan:   1. Type 2 diabetes mellitus without complication, without long-term current use of insulin (HCC)  Start Ozempic 0.25mg .  Side effects discussed. Knows not to get pregnant while taking  Contraindications: (patient  denies) Pancreatitis (active gallstones) Medullary thyroid cancer High triglycerides (>500)-will need labs prior to starting Multiple Endocrine Neoplasia syndrome type 2 (MEN 2) Trying to get pregnant Breastfeeding Use with caution with taking insulin or sulfonylureas (will need to monitor blood sugars for hypoglycemia)  - ondansetron (ZOFRAN) 4 MG tablet; Take 1 tablet (4 mg total) by mouth every 8 (eight) hours as needed for nausea or vomiting.  Dispense: 20 tablet; Refill: 0  - Semaglutide,0.25 or 0.5MG /DOS, (OZEMPIC, 0.25 OR 0.5 MG/DOSE,) 2 MG/3ML SOPN; Inject 0.25 mg into the skin once a week.  Dispense: 3 mL; Refill: 0  2. Vitamin D deficiency Star over the counter Vit D gummies, 5,000 IU. Did good with this in the past. Side effects discussed.   3. Elevated BP without diagnosis of hypertension Will continue to monitor. Katrina Carlson is working on healthy weight loss and exercise to improve blood pressure control. We will watch for signs of hypotension as she continues her lifestyle modifications.   4. Obesity, Current BMI 53.8 Katrina Carlson is currently in the action stage of change. As such, her goal is to continue with weight loss efforts. She has agreed to the Category 3 Plan and the Category 4 Plan.   Exercise goals: All adults should avoid inactivity. Some physical activity is better than none, and adults who participate in any amount of physical activity gain some health benefits.   Chair exercises.  Behavioral modification strategies: increasing lean protein intake, increasing vegetables, and increasing water intake.  Katrina Carlson has agreed to follow-up with our clinic in 4 weeks. She was informed of the importance of frequent follow-up visits  to maximize her success with intensive lifestyle modifications for her multiple health conditions.   Objective:   Blood pressure (!) 146/100, pulse 87, temperature 98.1 F (36.7 C), temperature source Oral, height 5\' 7"  (1.702 m), weight (!) 343  lb (155.6 kg), SpO2 93 %. Body mass index is 53.72 kg/m.  General: Cooperative, alert, well developed, in no acute distress. HEENT: Conjunctivae and lids unremarkable. Cardiovascular: Regular rhythm.  Lungs: Normal work of breathing. Neurologic: No focal deficits.   Lab Results  Component Value Date   CREATININE 0.63 12/14/2021   BUN 9 12/14/2021   NA 137 12/14/2021   K 4.2 12/14/2021   CL 102 12/14/2021   CO2 21 12/14/2021   Lab Results  Component Value Date   ALT 20 12/14/2021   AST 17 12/14/2021   ALKPHOS 66 12/14/2021   BILITOT 0.2 12/14/2021   Lab Results  Component Value Date   HGBA1C 7.3 (H) 12/14/2021   HGBA1C 6.1 (H) 07/07/2021   HGBA1C 6.0 (H) 02/01/2021   HGBA1C 6.0 (H) 05/26/2020   HGBA1C 5.6 09/23/2019   Lab Results  Component Value Date   INSULIN 235.0 (H) 12/14/2021   INSULIN 93.0 (H) 07/07/2021   INSULIN 104.0 (H) 02/01/2021   INSULIN 70.2 (H) 05/26/2020   INSULIN 166.0 (H) 09/23/2019   Lab Results  Component Value Date   TSH 1.700 12/14/2021   Lab Results  Component Value Date   CHOL 144 12/14/2021   HDL 34 (L) 12/14/2021   LDLCALC 87 12/14/2021   TRIG 125 12/14/2021   CHOLHDL 4.0 02/01/2021   Lab Results  Component Value Date   VD25OH 8.1 (L) 12/14/2021   VD25OH 12.9 (L) 02/01/2021   VD25OH 12.7 (L) 05/26/2020   Lab Results  Component Value Date   WBC 9.3 12/14/2021   HGB 14.7 12/14/2021   HCT 43.6 12/14/2021   MCV 90 12/14/2021   PLT 323 12/14/2021   Lab Results  Component Value Date   IRON 54 05/26/2020   TIBC 318 05/26/2020   FERRITIN 77 05/26/2020   Attestation Statements:   Reviewed by clinician on day of visit: allergies, medications, problem list, medical history, surgical history, family history, social history, and previous encounter notes.  Time spent on visit including pre-visit chart review and post-visit care and charting was 60 minutes.   I, Brendell Tyus, RMA, am acting as transcriptionist for Everardo Pacific, FNP.  I have reviewed the above documentation for accuracy and completeness, and I agree with the above. Everardo Pacific, FNP

## 2022-01-09 NOTE — Telephone Encounter (Signed)
Noted  

## 2022-01-31 ENCOUNTER — Encounter: Payer: Self-pay | Admitting: Nurse Practitioner

## 2022-01-31 ENCOUNTER — Ambulatory Visit: Payer: BC Managed Care – PPO | Admitting: Nurse Practitioner

## 2022-01-31 VITALS — BP 140/81 | HR 84 | Temp 98.2°F | Ht 67.0 in | Wt 335.0 lb

## 2022-01-31 DIAGNOSIS — E119 Type 2 diabetes mellitus without complications: Secondary | ICD-10-CM | POA: Diagnosis not present

## 2022-01-31 DIAGNOSIS — R030 Elevated blood-pressure reading, without diagnosis of hypertension: Secondary | ICD-10-CM

## 2022-01-31 DIAGNOSIS — Z7985 Long-term (current) use of injectable non-insulin antidiabetic drugs: Secondary | ICD-10-CM

## 2022-01-31 DIAGNOSIS — Z6841 Body Mass Index (BMI) 40.0 and over, adult: Secondary | ICD-10-CM | POA: Diagnosis not present

## 2022-01-31 DIAGNOSIS — E669 Obesity, unspecified: Secondary | ICD-10-CM

## 2022-01-31 DIAGNOSIS — E66813 Obesity, class 3: Secondary | ICD-10-CM

## 2022-01-31 MED ORDER — OZEMPIC (0.25 OR 0.5 MG/DOSE) 2 MG/3ML ~~LOC~~ SOPN: 0.2500 mg | PEN_INJECTOR | SUBCUTANEOUS | 0 refills | Status: DC

## 2022-02-14 NOTE — Progress Notes (Unsigned)
Chief Complaint:   OBESITY Katrina Carlson is here to discuss her progress with her obesity treatment plan along with follow-up of her obesity related diagnoses. Katrina Carlson is on the Category 4 Plan and states she is following her eating plan approximately 50% of the time. Katrina Carlson states she is exercising 0 minutes 0 times per week.  Today's visit was #: 22 Starting weight: 331 lbs Starting date: 04/29/2019 Today's weight: 335 lbs Today's date: 01/31/2022 Total lbs lost to date: 0 lbs Total lbs lost since last in-office visit: 8  Interim History: Katrina Carlson has done well with weight loss since her last visit. Has reduced some stress since her last visit. She has been doing chair exercises. Seeing Orthro today for follow up on MRI results on Rt ankle and foot.  Subjective:   1. Type 2 diabetes mellitus without complication, without long-term current use of insulin (HCC) Katrina Carlson is taking Ozempic 0.25 mg, started Thanksgiving. Feels "bad for 2 hours after taking it then feels good the rest of the week. Denies hypoglycemia.  2. Elevated BP without diagnosis of hypertension Katrina Carlson's blood pressure is up today. Not currently on medication.  Assessment/Plan:   1. Type 2 diabetes mellitus without complication, without long-term current use of insulin (HCC) We will refill Ozempic 0.25 mg SQ once weekly for 1 month wit 0 refills. Will continue to monitor for worsening side effects.  -Refill Semaglutide,0.25 or 0.5MG /DOS, (OZEMPIC, 0.25 OR 0.5 MG/DOSE,) 2 MG/3ML SOPN; Inject 0.25 mg into the skin once a week.  Dispense: 3 mL; Refill: 0  2. Elevated BP without diagnosis of hypertension Katrina Carlson is working on healthy weight loss and exercise to improve blood pressure control. We will watch for signs of hypotension as she continues her lifestyle modifications. Will continue to monitor.  3. Obesity, Current BMI 52.6 Katrina Carlson is currently in the action stage of change. As such, her goal is to continue  with weight loss efforts. She has agreed to the Category 4 Plan.   Exercise goals: All adults should avoid inactivity. Some physical activity is better than none, and adults who participate in any amount of physical activity gain some health benefits.  Behavioral modification strategies: increasing lean protein intake, increasing water intake, and holiday eating strategies .  Katrina Carlson has agreed to follow-up with our clinic in 3 weeks. She was informed of the importance of frequent follow-up visits to maximize her success with intensive lifestyle modifications for her multiple health conditions.   Objective:   Blood pressure (!) 140/81, pulse 84, temperature 98.2 F (36.8 C), temperature source Oral, height 5\' 7"  (1.702 m), weight (!) 335 lb (152 kg), SpO2 95 %. Body mass index is 52.47 kg/m.  General: Cooperative, alert, well developed, in no acute distress. HEENT: Conjunctivae and lids unremarkable. Cardiovascular: Regular rhythm.  Lungs: Normal work of breathing. Neurologic: No focal deficits.   Lab Results  Component Value Date   CREATININE 0.63 12/14/2021   BUN 9 12/14/2021   NA 137 12/14/2021   K 4.2 12/14/2021   CL 102 12/14/2021   CO2 21 12/14/2021   Lab Results  Component Value Date   ALT 20 12/14/2021   AST 17 12/14/2021   ALKPHOS 66 12/14/2021   BILITOT 0.2 12/14/2021   Lab Results  Component Value Date   HGBA1C 7.3 (H) 12/14/2021   HGBA1C 6.1 (H) 07/07/2021   HGBA1C 6.0 (H) 02/01/2021   HGBA1C 6.0 (H) 05/26/2020   HGBA1C 5.6 09/23/2019   Lab Results  Component Value Date  INSULIN 235.0 (H) 12/14/2021   INSULIN 93.0 (H) 07/07/2021   INSULIN 104.0 (H) 02/01/2021   INSULIN 70.2 (H) 05/26/2020   INSULIN 166.0 (H) 09/23/2019   Lab Results  Component Value Date   TSH 1.700 12/14/2021   Lab Results  Component Value Date   CHOL 144 12/14/2021   HDL 34 (L) 12/14/2021   LDLCALC 87 12/14/2021   TRIG 125 12/14/2021   CHOLHDL 4.0 02/01/2021   Lab  Results  Component Value Date   VD25OH 8.1 (L) 12/14/2021   VD25OH 12.9 (L) 02/01/2021   VD25OH 12.7 (L) 05/26/2020   Lab Results  Component Value Date   WBC 9.3 12/14/2021   HGB 14.7 12/14/2021   HCT 43.6 12/14/2021   MCV 90 12/14/2021   PLT 323 12/14/2021   Lab Results  Component Value Date   IRON 54 05/26/2020   TIBC 318 05/26/2020   FERRITIN 77 05/26/2020   Attestation Statements:   Reviewed by clinician on day of visit: allergies, medications, problem list, medical history, surgical history, family history, social history, and previous encounter notes.  I, Brendell Tyus, RMA, am acting as transcriptionist for Irene Limbo, FNP.  I have reviewed the above documentation for accuracy and completeness, and I agree with the above. -  ***

## 2022-02-21 ENCOUNTER — Ambulatory Visit: Payer: BC Managed Care – PPO | Admitting: Nurse Practitioner

## 2022-02-21 ENCOUNTER — Encounter: Payer: Self-pay | Admitting: Nurse Practitioner

## 2022-02-21 VITALS — BP 140/88 | HR 79 | Temp 98.3°F | Ht 67.0 in | Wt 338.0 lb

## 2022-02-21 DIAGNOSIS — E119 Type 2 diabetes mellitus without complications: Secondary | ICD-10-CM

## 2022-02-21 DIAGNOSIS — Z7985 Long-term (current) use of injectable non-insulin antidiabetic drugs: Secondary | ICD-10-CM

## 2022-02-21 DIAGNOSIS — R030 Elevated blood-pressure reading, without diagnosis of hypertension: Secondary | ICD-10-CM

## 2022-02-21 DIAGNOSIS — Z6841 Body Mass Index (BMI) 40.0 and over, adult: Secondary | ICD-10-CM

## 2022-02-21 DIAGNOSIS — E669 Obesity, unspecified: Secondary | ICD-10-CM | POA: Diagnosis not present

## 2022-02-21 MED ORDER — OZEMPIC (0.25 OR 0.5 MG/DOSE) 2 MG/3ML ~~LOC~~ SOPN: 0.5000 mg | PEN_INJECTOR | SUBCUTANEOUS | 0 refills | Status: DC

## 2022-02-28 NOTE — Progress Notes (Unsigned)
Chief Complaint:   OBESITY Katrina Carlson is here to discuss her progress with her obesity treatment plan along with follow-up of her obesity related diagnoses. Katrina Carlson is on the Category 4 Plan and states she is following her eating plan approximately 70% of the time. Katrina Carlson states she is exercising 0 minutes 0 times per week.  Today's visit was #: 23 Starting weight: 331 lbs Starting date: 04/29/2019 Today's weight: 338 lbs Today's date: 02/21/2021 Total lbs lost to date: 0 lbs Total lbs lost since last in-office visit: 0  Interim History: Katrina Carlson has gotten off track due to the holidays.  Has been drinking more sugary drinks since her last visit.  Plans to get back on track.  Still struggling with foot pain.  Has been referred for future evaluation.  Subjective:   1. Type 2 diabetes mellitus without complication, without long-term current use of insulin (HCC) Katrina Carlson is taking Ozempic 0.25 mg.  Denies side effects.  Reports some polyphagia and craving.  Denies hypoglycemia.   2. Elevated BP without diagnosis of hypertension Katrina Carlson's blood pressure is elevated again today.  Has been elevated for the past 4 visits.  Assessment/Plan:   1. Type 2 diabetes mellitus without complication, without long-term current use of insulin (HCC) Increase/Refill Ozempic to 0.5 mg SQ once a week for 1 month with 0 refills.  Side effects discussed.   -Increase/Refill Semaglutide,0.25 or 0.5MG /DOS, (OZEMPIC, 0.25 OR 0.5 MG/DOSE,) 2 MG/3ML SOPN; Inject 0.5 mg into the skin once a week.  Dispense: 3 mL; Refill: 0  2. Elevated BP without diagnosis of hypertension Recommended to reach out to PCP to discuss treatment options.  3. Obesity, Current BMI 53.0 Katrina Carlson is currently in the action stage of change. As such, her goal is to continue with weight loss efforts. She has agreed to the Category 4 Plan.   Exercise goals: All adults should avoid inactivity. Some physical activity is better than none, and  adults who participate in any amount of physical activity gain some health benefits.  [name] plans to start back walking. Will obtain labs at next visit.  Behavioral modification strategies: increasing lean protein intake, increasing water intake, and no skipping meals.  Katrina Carlson has agreed to follow-up with our clinic in 3 weeks. She was informed of the importance of frequent follow-up visits to maximize her success with intensive lifestyle modifications for her multiple health conditions.   Objective:   Blood pressure (!) 140/88, pulse 79, temperature 98.3 F (36.8 C), height 5\' 7"  (1.702 m), weight (!) 338 lb (153.3 kg), SpO2 95 %. Body mass index is 52.94 kg/m.  General: Cooperative, alert, well developed, in no acute distress. HEENT: Conjunctivae and lids unremarkable. Cardiovascular: Regular rhythm.  Lungs: Normal work of breathing. Neurologic: No focal deficits.   Lab Results  Component Value Date   CREATININE 0.63 12/14/2021   BUN 9 12/14/2021   NA 137 12/14/2021   K 4.2 12/14/2021   CL 102 12/14/2021   CO2 21 12/14/2021   Lab Results  Component Value Date   ALT 20 12/14/2021   AST 17 12/14/2021   ALKPHOS 66 12/14/2021   BILITOT 0.2 12/14/2021   Lab Results  Component Value Date   HGBA1C 7.3 (H) 12/14/2021   HGBA1C 6.1 (H) 07/07/2021   HGBA1C 6.0 (H) 02/01/2021   HGBA1C 6.0 (H) 05/26/2020   HGBA1C 5.6 09/23/2019   Lab Results  Component Value Date   INSULIN 235.0 (H) 12/14/2021   INSULIN 93.0 (H) 07/07/2021  INSULIN 104.0 (H) 02/01/2021   INSULIN 70.2 (H) 05/26/2020   INSULIN 166.0 (H) 09/23/2019   Lab Results  Component Value Date   TSH 1.700 12/14/2021   Lab Results  Component Value Date   CHOL 144 12/14/2021   HDL 34 (L) 12/14/2021   LDLCALC 87 12/14/2021   TRIG 125 12/14/2021   CHOLHDL 4.0 02/01/2021   Lab Results  Component Value Date   VD25OH 8.1 (L) 12/14/2021   VD25OH 12.9 (L) 02/01/2021   VD25OH 12.7 (L) 05/26/2020   Lab Results   Component Value Date   WBC 9.3 12/14/2021   HGB 14.7 12/14/2021   HCT 43.6 12/14/2021   MCV 90 12/14/2021   PLT 323 12/14/2021   Lab Results  Component Value Date   IRON 54 05/26/2020   TIBC 318 05/26/2020   FERRITIN 77 05/26/2020   Attestation Statements:   Reviewed by clinician on day of visit: allergies, medications, problem list, medical history, surgical history, family history, social history, and previous encounter notes.  I, Brendell Tyus, RMA, am acting as transcriptionist for Katrina Pacific, FNP.  I have reviewed the above documentation for accuracy and completeness, and I agree with the above. Katrina Pacific, FNP

## 2022-03-22 ENCOUNTER — Encounter: Payer: Self-pay | Admitting: Nurse Practitioner

## 2022-03-22 ENCOUNTER — Ambulatory Visit: Payer: BC Managed Care – PPO | Admitting: Nurse Practitioner

## 2022-03-22 VITALS — BP 151/83 | HR 75 | Temp 98.1°F | Ht 67.0 in | Wt 332.0 lb

## 2022-03-22 DIAGNOSIS — I10 Essential (primary) hypertension: Secondary | ICD-10-CM

## 2022-03-22 DIAGNOSIS — E559 Vitamin D deficiency, unspecified: Secondary | ICD-10-CM

## 2022-03-22 DIAGNOSIS — Z6841 Body Mass Index (BMI) 40.0 and over, adult: Secondary | ICD-10-CM

## 2022-03-22 DIAGNOSIS — Z7985 Long-term (current) use of injectable non-insulin antidiabetic drugs: Secondary | ICD-10-CM

## 2022-03-22 DIAGNOSIS — E119 Type 2 diabetes mellitus without complications: Secondary | ICD-10-CM | POA: Diagnosis not present

## 2022-03-22 MED ORDER — OZEMPIC (0.25 OR 0.5 MG/DOSE) 2 MG/3ML ~~LOC~~ SOPN: 0.5000 mg | PEN_INJECTOR | SUBCUTANEOUS | 0 refills | Status: DC

## 2022-03-22 MED ORDER — LISINOPRIL 5 MG PO TABS: 5.0000 mg | ORAL_TABLET | Freq: Every day | ORAL | 0 refills | Status: DC

## 2022-03-23 LAB — COMPREHENSIVE METABOLIC PANEL
ALT: 18 IU/L (ref 0–32)
AST: 18 IU/L (ref 0–40)
Albumin/Globulin Ratio: 1 — ABNORMAL LOW (ref 1.2–2.2)
Albumin: 3.8 g/dL — ABNORMAL LOW (ref 3.9–4.9)
Alkaline Phosphatase: 57 IU/L (ref 44–121)
BUN/Creatinine Ratio: 10 (ref 9–23)
BUN: 7 mg/dL (ref 6–20)
Bilirubin Total: 0.5 mg/dL (ref 0.0–1.2)
CO2: 21 mmol/L (ref 20–29)
Calcium: 8.9 mg/dL (ref 8.7–10.2)
Chloride: 104 mmol/L (ref 96–106)
Creatinine, Ser: 0.71 mg/dL (ref 0.57–1.00)
Globulin, Total: 3.7 g/dL (ref 1.5–4.5)
Glucose: 89 mg/dL (ref 70–99)
Potassium: 4.4 mmol/L (ref 3.5–5.2)
Sodium: 141 mmol/L (ref 134–144)
Total Protein: 7.5 g/dL (ref 6.0–8.5)
eGFR: 113 mL/min/{1.73_m2} (ref >59)

## 2022-03-23 LAB — HEMOGLOBIN A1C
Est. average glucose Bld gHb Est-mCnc: 134 mg/dL
Hgb A1c MFr Bld: 6.3 % — ABNORMAL HIGH (ref 4.8–5.6)

## 2022-03-23 LAB — INSULIN, RANDOM: INSULIN: 99.6 u[IU]/mL — ABNORMAL HIGH (ref 2.6–24.9)

## 2022-03-23 LAB — VITAMIN D 25 HYDROXY (VIT D DEFICIENCY, FRACTURES): Vit D, 25-Hydroxy: 7.4 ng/mL — ABNORMAL LOW (ref 30.0–100.0)

## 2022-03-30 NOTE — Progress Notes (Signed)
Chief Complaint:   OBESITY Katrina Carlson is here to discuss her progress with her obesity treatment plan along with follow-up of her obesity related diagnoses. [name] is on the Category 4 Plan and states she is following her eating plan approximately 70% of the time. [name] states she is exercising 0 minutes 0 times per week.  Today's visit was #: 24 Starting weight: 331 lbs Starting date: 04/29/2019 Today's weight: 332 lbs Today's date: 03/22/2022 Total lbs lost to date: 0 lbs Total lbs lost since last in-office visit: 6  Interim History: Katrina Carlson has done well with weight loss since her last visit.    Subjective:   1. Essential hypertension Arretta's blood pressure has been elevated since 12/14/21.  Denies over the counter medications.  Family history: father-hypertension.  She has never been on medications.  Denies any chest pain,shortness of breath or palpitations or LEE.  Does note a few headaches.  2. Type 2 diabetes mellitus without complication, without long-term current use of insulin (HCC) Taking Ozempic 0.5 mg.  Denies any side effects.  Craving sweets.   Denies hypoglycemia.  ACE started today.  3. Vitamin D deficiency Taking over the counter Vit D.  Has had some side effects of bleeding with Vit D 50,000 IU.  Assessment/Plan:   1. Essential hypertension We will obtain labs today.  Will start Lisinopril 5 mg daily for 1 month with 0 refills.  Side effects discussed.   -Start lisinopril (ZESTRIL) 5 MG tablet; Take 1 tablet (5 mg total) by mouth daily.  Dispense: 30 tablet; Refill: 0 - Comprehensive metabolic panel  2. Type 2 diabetes mellitus without complication, without long-term current use of insulin (HCC) We will obtain labs today.  Will refill Ozempic  0.5 mg SQ once a week for 1 month with 0 refills.  Side effects discussed.   - Comprehensive metabolic panel - Insulin, random - Hemoglobin A1c  -Refill Semaglutide,0.25 or 0.5MG /DOS, (OZEMPIC, 0.25 OR 0.5  MG/DOSE,) 2 MG/3ML SOPN; Inject 0.5 mg into the skin once a week.  Dispense: 3 mL; Refill: 0   3. Vitamin D deficiency We will obtain labs today.  - Comprehensive metabolic panel - VITAMIN D 25 Hydroxy (Vit-D Deficiency, Fractures)  4. Morbid obesity ([place])  5. BMI 50.0-59.9, adult Katrina Carlson) [name] is currently in the action stage of change. As such, her goal is to continue with weight loss efforts. She has agreed to the Category 4 Plan.   Exercise goals: All adults should avoid inactivity. Some physical activity is better than none, and adults who participate in any amount of physical activity gain some health benefits.  Behavioral modification strategies: increasing lean protein intake, increasing vegetables, increasing water intake, and planning for success.  [name] has agreed to follow-up with our clinic in 4 weeks. She was informed of the importance of frequent follow-up visits to maximize her success with intensive lifestyle modifications for her multiple health conditions.   [name] was informed we would discuss her lab results at her next visit unless there is a critical issue that needs to be addressed sooner. [name] agreed to keep her next visit at the agreed upon time to discuss these results.  Objective:   Blood pressure (!) 151/83, pulse 75, temperature 98.1 F (36.7 C), height 5\' 7"  (1.702 m), weight (!) 332 lb (150.6 kg), SpO2 97 %. Body mass index is 52 kg/m.  General: Cooperative, alert, well developed, in no acute distress. HEENT: Conjunctivae and lids unremarkable. Cardiovascular: Regular rhythm.  Lungs: Normal  work of breathing. Neurologic: No focal deficits.   Lab Results  Component Value Date   CREATININE 0.71 03/22/2022   BUN 7 03/22/2022   NA 141 03/22/2022   K 4.4 03/22/2022   CL 104 03/22/2022   CO2 21 03/22/2022   Lab Results  Component Value Date   ALT 18 03/22/2022   AST 18 03/22/2022   ALKPHOS 57 03/22/2022   BILITOT 0.5 03/22/2022   Lab  Results  Component Value Date   HGBA1C 6.3 (H) 03/22/2022   HGBA1C 7.3 (H) 12/14/2021   HGBA1C 6.1 (H) 07/07/2021   HGBA1C 6.0 (H) 02/01/2021   HGBA1C 6.0 (H) 05/26/2020   Lab Results  Component Value Date   INSULIN 99.6 (H) 03/22/2022   INSULIN 235.0 (H) 12/14/2021   INSULIN 93.0 (H) 07/07/2021   INSULIN 104.0 (H) 02/01/2021   INSULIN 70.2 (H) 05/26/2020   Lab Results  Component Value Date   TSH 1.700 12/14/2021   Lab Results  Component Value Date   CHOL 144 12/14/2021   HDL 34 (L) 12/14/2021   LDLCALC 87 12/14/2021   TRIG 125 12/14/2021   CHOLHDL 4.0 02/01/2021   Lab Results  Component Value Date   VD25OH 7.4 (L) 03/22/2022   VD25OH 8.1 (L) 12/14/2021   VD25OH 12.9 (L) 02/01/2021   Lab Results  Component Value Date   WBC 9.3 12/14/2021   HGB 14.7 12/14/2021   HCT 43.6 12/14/2021   MCV 90 12/14/2021   PLT 323 12/14/2021   Lab Results  Component Value Date   IRON 54 05/26/2020   TIBC 318 05/26/2020   FERRITIN 77 05/26/2020   Attestation Statements:   Reviewed by clinician on day of visit: allergies, medications, problem list, medical history, surgical history, family history, social history, and previous encounter notes.  I, Brendell Tyus, RMA, am acting as transcriptionist for Everardo Pacific, FNP.  I have reviewed the above documentation for accuracy and completeness, and I agree with the above. Everardo Pacific, FNP

## 2022-04-25 ENCOUNTER — Other Ambulatory Visit: Payer: Self-pay | Admitting: Nurse Practitioner

## 2022-04-25 DIAGNOSIS — I10 Essential (primary) hypertension: Secondary | ICD-10-CM

## 2022-04-27 ENCOUNTER — Ambulatory Visit: Payer: BC Managed Care – PPO | Admitting: Nurse Practitioner

## 2022-05-10 ENCOUNTER — Ambulatory Visit: Payer: BC Managed Care – PPO | Admitting: Nurse Practitioner

## 2022-05-10 ENCOUNTER — Encounter: Payer: Self-pay | Admitting: Nurse Practitioner

## 2022-05-10 VITALS — BP 134/84 | HR 77 | Temp 98.7°F | Ht 67.0 in | Wt 327.0 lb

## 2022-05-10 DIAGNOSIS — E559 Vitamin D deficiency, unspecified: Secondary | ICD-10-CM | POA: Diagnosis not present

## 2022-05-10 DIAGNOSIS — Z6841 Body Mass Index (BMI) 40.0 and over, adult: Secondary | ICD-10-CM

## 2022-05-10 DIAGNOSIS — I10 Essential (primary) hypertension: Secondary | ICD-10-CM

## 2022-05-10 DIAGNOSIS — E119 Type 2 diabetes mellitus without complications: Secondary | ICD-10-CM | POA: Diagnosis not present

## 2022-05-10 DIAGNOSIS — Z7985 Long-term (current) use of injectable non-insulin antidiabetic drugs: Secondary | ICD-10-CM

## 2022-05-10 MED ORDER — SEMAGLUTIDE (1 MG/DOSE) 4 MG/3ML ~~LOC~~ SOPN: 1.0000 mg | PEN_INJECTOR | SUBCUTANEOUS | 0 refills | Status: DC

## 2022-05-10 MED ORDER — VITAMIN D (ERGOCALCIFEROL) 1.25 MG (50000 UNIT) PO CAPS: 50000.0000 [IU] | ORAL_CAPSULE | ORAL | 0 refills | Status: DC

## 2022-05-10 NOTE — Progress Notes (Signed)
Office: [phone]  /  Fax: [phone]  WEIGHT SUMMARY AND BIOMETRICS  Weight Lost Since Last Visit: 5lb  No data recorded  Vitals Temp: 98.7 F (37.1 C) BP: 134/84 Pulse Rate: 77 SpO2: 100 %   Anthropometric Measurements Height: 5\' 7"  (1.702 m) Weight: (!) 327 lb (148.3 kg) BMI (Calculated): 51.2 Weight at Last Visit: 332lb Weight Lost Since Last Visit: 5lb Starting Weight: 331lb Total Weight Loss (lbs): 4 lb (1.814 kg)   Body Composition  Body Fat %: 54.3 % Fat Mass (lbs): 177.8 lbs Muscle Mass (lbs): 142 lbs Total Body Water (lbs): 114 lbs Visceral Fat Rating : 19   Other Clinical Data Today's Visit #: 25 Starting Date: 04/29/19     HPI  Chief Complaint: OBESITY  Katrina Carlson is here to discuss her progress with her obesity treatment plan. She is on the the Category 4 Plan and states she is following her eating plan approximately 60 % of the time. She states she is exercising 40 minutes 2 days per week.   Interval History:  Since last office visit she has lost 5 pounds.  She is making healthier choices and watching her portion sizes. Her father is not doing well and she has been stress eating.     Pharmacotherapy for weight loss: She is not currently taking medications  for medical weight loss.    Previous pharmacotherapy for medical weight loss:  [place]  She wasn't able to increase Wegovy due to [place] shortage.    Bariatric surgery:  Patient never had bariatric surgery.    Hypertension Hypertension BP looks better today.  Family history: father-hypertension.  Medication(s): Started Lisinopril 5mg  after her last visit.  She is taking it 3-4 days per week.  Denies side effects.   Denies chest pain, palpitations and SOB.  BP Readings from Last 3 Encounters:  05/10/22 134/84  03/22/22 (!) 151/83  02/21/22 (!) 140/88   Lab Results  Component Value Date   CREATININE 0.71 03/22/2022   CREATININE 0.63 12/14/2021   CREATININE  0.71 07/07/2021    Pharmacotherapy for DMT2:  She is currently taking Ozempic 0.5mg .  Denies side effects.  Cravings some sweets.  Last A1c was 6.3 She is not checking BS at home.   Episodes of hypoglycemia: no On ACE or ARB, ASA 81mg  and statin.  Last eye exam:  2017    Lab Results  Component Value Date   HGBA1C 6.3 (H) 03/22/2022   HGBA1C 7.3 (H) 12/14/2021   HGBA1C 6.1 (H) 07/07/2021   Lab Results  Component Value Date   LDLCALC 87 12/14/2021   CREATININE 0.71 03/22/2022    PHYSICAL EXAM:  Blood pressure 134/84, pulse 77, temperature 98.7 F (37.1 C), height 5\' 7"  (1.702 m), weight (!) 327 lb (148.3 kg), SpO2 100 %. Body mass index is 51.22 kg/m.  General: She is overweight, cooperative, alert, well developed, and in no acute distress. PSYCH: Has normal mood, affect and thought process.   Extremities: No edema.  Neurologic: No gross sensory or motor deficits. No tremors or fasciculations noted.    DIAGNOSTIC DATA REVIEWED:  BMET    Component Value Date/Time   NA 141 03/22/2022 1120   K 4.4 03/22/2022 1120   CL 104 03/22/2022 1120   CO2 21 03/22/2022 1120   GLUCOSE 89 03/22/2022 1120   GLUCOSE 199 (H) 12/02/2018 0240   BUN 7 03/22/2022 1120   CREATININE 0.71 03/22/2022 1120   CALCIUM 8.9 03/22/2022 1120   GFRNONAA 119  09/23/2019 1202   GFRAA 138 09/23/2019 1202   Lab Results  Component Value Date   HGBA1C 6.3 (H) 03/22/2022   HGBA1C 5.6 09/23/2019   Lab Results  Component Value Date   INSULIN 99.6 (H) 03/22/2022   INSULIN 166.0 (H) 09/23/2019   Lab Results  Component Value Date   TSH 1.700 12/14/2021   CBC    Component Value Date/Time   WBC 9.3 12/14/2021 1619   WBC 13.7 (H) 12/03/2018 0620   RBC 4.83 12/14/2021 1619   RBC 5.28 (H) 12/03/2018 0620   HGB 14.7 12/14/2021 1619   HCT 43.6 12/14/2021 1619   PLT 323 12/14/2021 1619   MCV 90 12/14/2021 1619   MCH 30.4 12/14/2021 1619   MCH 29.9 12/03/2018 0620   MCHC 33.7 12/14/2021 1619   MCHC  32.3 12/03/2018 0620   RDW 12.3 12/14/2021 1619   Iron Studies    Component Value Date/Time   IRON 54 05/26/2020 1011   TIBC 318 05/26/2020 1011   FERRITIN 77 05/26/2020 1011   IRONPCTSAT 17 05/26/2020 1011   Lipid Panel     Component Value Date/Time   CHOL 144 12/14/2021 1619   TRIG 125 12/14/2021 1619   HDL 34 (L) 12/14/2021 1619   CHOLHDL 4.0 02/01/2021 1146   LDLCALC 87 12/14/2021 1619   Hepatic Function Panel     Component Value Date/Time   PROT 7.5 03/22/2022 1120   ALBUMIN 3.8 (L) 03/22/2022 1120   AST 18 03/22/2022 1120   ALT 18 03/22/2022 1120   ALKPHOS 57 03/22/2022 1120   BILITOT 0.5 03/22/2022 1120      Component Value Date/Time   TSH 1.700 12/14/2021 1619   Nutritional Lab Results  Component Value Date   VD25OH 7.4 (L) 03/22/2022   VD25OH 8.1 (L) 12/14/2021   VD25OH 12.9 (L) 02/01/2021     ASSESSMENT AND PLAN  TREATMENT PLAN FOR OBESITY:  Recommended Dietary Goals  [name] is currently in the action stage of change. As such, her goal is to continue weight management plan. She has agreed to the Category 4 Plan.  Behavioral Intervention  We discussed the following Behavioral Modification Strategies today: increasing lean protein intake, decreasing simple carbohydrates , increasing vegetables, avoiding skipping meals, increasing water intake, work on meal planning and easy cooking plans, planning for success, and keeping healthy foods at home.  Additional resources provided today: NA  Recommended Physical Activity Goals  [name] has been advised to work up to 150 minutes of moderate intensity aerobic activity a week and strengthening exercises 2-3 times per week for cardiovascular health, weight loss maintenance and preservation of muscle mass.   She has agreed to Continue current level of physical activity    Pharmacotherapy We discussed various medication options to help [name] with her weight loss efforts and we both agreed to continue  Ozempic.  ASSOCIATED CONDITIONS ADDRESSED TODAY  Action/Plan  Type 2 diabetes mellitus without complication, without long-term current use of insulin (HCC) -     Semaglutide (1 MG/DOSE); Inject 1 mg as directed once a week.  Dispense: 3 mL; Refill: 0 Last A1c and insulin looked better  Good blood sugar control is important to decrease the likelihood of diabetic complications such as nephropathy, neuropathy, limb loss, blindness, coronary artery disease, and death. Intensive lifestyle modification including diet, exercise and weight loss are the first line of treatment for diabetes.    Essential hypertension Continue meds as directed.  Will continue to monitor.    Vit D  def Take Vit D 50,000 IU weekly.  Side effects discussed.    Morbid obesity ([place])  BMI 50.0-59.9, adult ([place])  Labs reviewed in chart from 03/22/22     Return in about 4 weeks (around 06/07/2022).Marland Kitchen She was informed of the importance of frequent follow up visits to maximize her success with intensive lifestyle modifications for her multiple health conditions.   ATTESTASTION STATEMENTS:  Reviewed by clinician on day of visit: allergies, medications, problem list, medical history, surgical history, family history, social history, and previous encounter notes.      Ailene Rud. Xaiden Fleig FNP-C

## 2022-06-14 ENCOUNTER — Ambulatory Visit: Payer: BC Managed Care – PPO | Admitting: Nurse Practitioner

## 2022-06-15 ENCOUNTER — Other Ambulatory Visit: Payer: Self-pay | Admitting: Nurse Practitioner

## 2022-06-15 DIAGNOSIS — I10 Essential (primary) hypertension: Secondary | ICD-10-CM

## 2022-06-18 ENCOUNTER — Other Ambulatory Visit: Payer: Self-pay | Admitting: Nurse Practitioner

## 2022-06-18 DIAGNOSIS — E119 Type 2 diabetes mellitus without complications: Secondary | ICD-10-CM

## 2022-06-27 ENCOUNTER — Encounter: Payer: Self-pay | Admitting: Nurse Practitioner

## 2022-06-27 ENCOUNTER — Ambulatory Visit: Payer: BC Managed Care – PPO | Admitting: Nurse Practitioner

## 2022-06-27 VITALS — BP 138/86 | HR 75 | Temp 98.1°F | Ht 67.0 in | Wt 328.0 lb

## 2022-06-27 DIAGNOSIS — N912 Amenorrhea, unspecified: Secondary | ICD-10-CM

## 2022-06-27 DIAGNOSIS — I10 Essential (primary) hypertension: Secondary | ICD-10-CM | POA: Diagnosis not present

## 2022-06-27 DIAGNOSIS — E119 Type 2 diabetes mellitus without complications: Secondary | ICD-10-CM

## 2022-06-27 DIAGNOSIS — E559 Vitamin D deficiency, unspecified: Secondary | ICD-10-CM

## 2022-06-27 DIAGNOSIS — Z6841 Body Mass Index (BMI) 40.0 and over, adult: Secondary | ICD-10-CM

## 2022-06-27 LAB — POCT URINE PREGNANCY: Preg Test, Ur: NEGATIVE

## 2022-06-27 MED ORDER — VITAMIN D (ERGOCALCIFEROL) 1.25 MG (50000 UNIT) PO CAPS: 50000.0000 [IU] | ORAL_CAPSULE | ORAL | 0 refills | Status: DC

## 2022-06-27 NOTE — Progress Notes (Signed)
Office: [phone]  /  Fax: [phone]  WEIGHT SUMMARY AND BIOMETRICS  No data recorded Weight Gained Since Last Visit: 1lb   Vitals Temp: 98.1 F (36.7 C) BP: 138/86 Pulse Rate: 75 SpO2: 99 %   Anthropometric Measurements Height: 5\' 7"  (1.702 m) Weight: (!) 328 lb (148.8 kg) BMI (Calculated): 51.36 Weight at Last Visit: 327lb Weight Gained Since Last Visit: 1lb Starting Weight: 331lb Total Weight Loss (lbs): 3 lb (1.361 kg)   Body Composition  Body Fat %: 51 % Fat Mass (lbs): 167.6 lbs Muscle Mass (lbs): 153.2 lbs Total Body Water (lbs): 115 lbs Visceral Fat Rating : 18   Other Clinical Data Fasting: No Labs: No Today's Visit #: 26 Starting Date: 04/29/19     HPI  Chief Complaint: OBESITY  Linea is here to discuss her progress with her obesity treatment plan. She is on the the Category 4 Plan and states she is following her eating plan approximately 75 % of the time. She states she is exercising 0 minutes 0 days per week.   Interval History:  Since last office visit she has gained 1 pound.  Her father passed away since her last visit.     Pharmacotherapy for weight loss: She is not currently taking medications  for medical weight loss.    Previous pharmacotherapy for medical weight loss: [place]  She wasn't able to increase Wegovy due to [place] shortage.  Now unable to take due to cost    Bariatric surgery:  Patient has not had bariatric surgery.    Pharmacotherapy for DMT2:  She is currently taking Ozempic 0.5 mg-last dose 2 weeks ago-hasn't increased to 1mg .  Denies side effects.   Last A1c was 6.3 She is not checking BS at home.   Episodes of hypoglycemia: no On ACE  Last eye exam:  2024  Lab Results  Component Value Date   HGBA1C 6.3 (H) 03/22/2022   HGBA1C 7.3 (H) 12/14/2021   HGBA1C 6.1 (H) 07/07/2021   Lab Results  Component Value Date   LDLCALC 87 12/14/2021   CREATININE 0.71 03/22/2022    Vit D deficiency   She is not taking Vit D 50,000 IU weekly.  Denied side effects.  Denies nausea, vomiting or muscle weakness.    Lab Results  Component Value Date   VD25OH 7.4 (L) 03/22/2022   VD25OH 8.1 (L) 12/14/2021   VD25OH 12.9 (L) 02/01/2021     Hypertension Hypertension looks better.  Medication(s): Lisinopril 5mg -not taking on a regular basis.  Plans to try to take at least 5 days per week. Denies chest pain, palpitations and SOB.  BP Readings from Last 3 Encounters:  06/27/22 138/86  05/10/22 134/84  03/22/22 (!) 151/83   Lab Results  Component Value Date   CREATININE 0.71 03/22/2022   CREATININE 0.63 12/14/2021   CREATININE 0.71 07/07/2021    Amenorrhea   LMP March but unsure of the date-maybe early March  PHYSICAL EXAM:  Blood pressure 138/86, pulse 75, temperature 98.1 F (36.7 C), height 5\' 7"  (1.702 m), weight (!) 328 lb (148.8 kg), SpO2 99 %. Body mass index is 51.37 kg/m.  General: She is overweight, cooperative, alert, well developed, and in no acute distress. PSYCH: Has normal mood, affect and thought process.   Extremities: No edema.  Neurologic: No gross sensory or motor deficits. No tremors or fasciculations noted.    DIAGNOSTIC DATA REVIEWED:  BMET    Component Value Date/Time   NA 141 03/22/2022 1120  K 4.4 03/22/2022 1120   CL 104 03/22/2022 1120   CO2 21 03/22/2022 1120   GLUCOSE 89 03/22/2022 1120   GLUCOSE 199 (H) 12/02/2018 0240   BUN 7 03/22/2022 1120   CREATININE 0.71 03/22/2022 1120   CALCIUM 8.9 03/22/2022 1120   GFRNONAA 119 09/23/2019 1202   GFRAA 138 09/23/2019 1202   Lab Results  Component Value Date   HGBA1C 6.3 (H) 03/22/2022   HGBA1C 5.6 09/23/2019   Lab Results  Component Value Date   INSULIN 99.6 (H) 03/22/2022   INSULIN 166.0 (H) 09/23/2019   Lab Results  Component Value Date   TSH 1.700 12/14/2021   CBC    Component Value Date/Time   WBC 9.3 12/14/2021 1619   WBC 13.7 (H) 12/03/2018 0620   RBC 4.83 12/14/2021  1619   RBC 5.28 (H) 12/03/2018 0620   HGB 14.7 12/14/2021 1619   HCT 43.6 12/14/2021 1619   PLT 323 12/14/2021 1619   MCV 90 12/14/2021 1619   MCH 30.4 12/14/2021 1619   MCH 29.9 12/03/2018 0620   MCHC 33.7 12/14/2021 1619   MCHC 32.3 12/03/2018 0620   RDW 12.3 12/14/2021 1619   Iron Studies    Component Value Date/Time   IRON 54 05/26/2020 1011   TIBC 318 05/26/2020 1011   FERRITIN 77 05/26/2020 1011   IRONPCTSAT 17 05/26/2020 1011   Lipid Panel     Component Value Date/Time   CHOL 144 12/14/2021 1619   TRIG 125 12/14/2021 1619   HDL 34 (L) 12/14/2021 1619   CHOLHDL 4.0 02/01/2021 1146   LDLCALC 87 12/14/2021 1619   Hepatic Function Panel     Component Value Date/Time   PROT 7.5 03/22/2022 1120   ALBUMIN 3.8 (L) 03/22/2022 1120   AST 18 03/22/2022 1120   ALT 18 03/22/2022 1120   ALKPHOS 57 03/22/2022 1120   BILITOT 0.5 03/22/2022 1120      Component Value Date/Time   TSH 1.700 12/14/2021 1619   Nutritional Lab Results  Component Value Date   VD25OH 7.4 (L) 03/22/2022   VD25OH 8.1 (L) 12/14/2021   VD25OH 12.9 (L) 02/01/2021     ASSESSMENT AND PLAN  TREATMENT PLAN FOR OBESITY:  Recommended Dietary Goals  Glenn is currently in the action stage of change. As such, her goal is to continue weight management plan. She has agreed to the Category 4 Plan.  Behavioral Intervention  We discussed the following Behavioral Modification Strategies today: increasing lean protein intake, decreasing simple carbohydrates , increasing vegetables, increasing lower glycemic fruits, increasing fiber rich foods, avoiding skipping meals, increasing water intake, avoiding temptations and identifying enticing environmental cues, continue to work on implementation of reduced calorie nutritional plan, continue to practice mindfulness when eating, planning for success, and better snacking choices.  Additional resources provided today: NA  Recommended Physical Activity  Goals  Munni has been advised to work up to 150 minutes of moderate intensity aerobic activity a week and strengthening exercises 2-3 times per week for cardiovascular health, weight loss maintenance and preservation of muscle mass.   She has agreed to Think about ways to increase physical activity, Increase physical activity in their day and reduce sedentary time (increase NEAT)., and Work on scheduling and tracking physical activity.    ASSOCIATED CONDITIONS ADDRESSED TODAY  Action/Plan  Type 2 diabetes mellitus without complication, without long-term current use of insulin (HCC) -     Comprehensive metabolic panel -     Hemoglobin A1c  Restart Ozempic 0.5mg .  side effects discussed.    Discussed the importance with the patient of not getting pregnant and using protection/birth control while taking a GLP-1.  Patient verbalizes understanding.  If she does decide that she wants to get pregnant she is not to start Ozempic, let me know and I will refer her for diabetes management.  To start a PNV    Vitamin D deficiency -     VITAMIN D 25 Hydroxy (Vit-D Deficiency, Fractures) -     Vitamin D (Ergocalciferol); Take 1 capsule (50,000 Units total) by mouth every 7 (seven) days.  Dispense: 5 capsule; Refill: 0  Low Vitamin D level contributes to fatigue and are associated with obesity, breast, and colon cancer. She agrees to continue to take prescription Vitamin D @50 ,000 IU every week and will follow-up for routine testing of Vitamin D, at least 2-3 times per year to avoid over-replacement.   Essential hypertension Continue medications as directed.  Not to take Lisinopril if she decides to get pregnant.  To use protection/birth control while taking.    Amenorrhea -     POCT Pregnancy, Urine -     POCT urine pregnancy  Morbid obesity (HCC)  BMI 50.0-59.9, adult (HCC)      I spent over 40 minutes with the patient and reviewing her chart before and after her visit.     Return in  about 4 weeks (around 07/25/2022).Marland Kitchen She was informed of the importance of frequent follow up visits to maximize her success with intensive lifestyle modifications for her multiple health conditions.   ATTESTASTION STATEMENTS:  Reviewed by clinician on day of visit: allergies, medications, problem list, medical history, surgical history, family history, social history, and previous encounter notes.     Theodis Sato. Fredia Chittenden FNP-C

## 2022-06-28 LAB — COMPREHENSIVE METABOLIC PANEL
ALT: 18 IU/L (ref 0–32)
AST: 22 IU/L (ref 0–40)
Albumin/Globulin Ratio: 1.1 — ABNORMAL LOW (ref 1.2–2.2)
Albumin: 3.9 g/dL (ref 3.9–4.9)
Alkaline Phosphatase: 66 IU/L (ref 44–121)
BUN/Creatinine Ratio: 8 — ABNORMAL LOW (ref 9–23)
BUN: 5 mg/dL — ABNORMAL LOW (ref 6–20)
Bilirubin Total: 0.5 mg/dL (ref 0.0–1.2)
CO2: 23 mmol/L (ref 20–29)
Calcium: 9.2 mg/dL (ref 8.7–10.2)
Chloride: 100 mmol/L (ref 96–106)
Creatinine, Ser: 0.65 mg/dL (ref 0.57–1.00)
Globulin, Total: 3.7 g/dL (ref 1.5–4.5)
Glucose: 114 mg/dL — ABNORMAL HIGH (ref 70–99)
Potassium: 4.6 mmol/L (ref 3.5–5.2)
Sodium: 136 mmol/L (ref 134–144)
Total Protein: 7.6 g/dL (ref 6.0–8.5)
eGFR: 117 mL/min/{1.73_m2} (ref >59)

## 2022-06-28 LAB — HEMOGLOBIN A1C
Est. average glucose Bld gHb Est-mCnc: 128 mg/dL
Hgb A1c MFr Bld: 6.1 % — ABNORMAL HIGH (ref 4.8–5.6)

## 2022-06-28 LAB — VITAMIN D 25 HYDROXY (VIT D DEFICIENCY, FRACTURES): Vit D, 25-Hydroxy: 6.9 ng/mL — ABNORMAL LOW (ref 30.0–100.0)

## 2022-07-07 ENCOUNTER — Encounter (HOSPITAL_BASED_OUTPATIENT_CLINIC_OR_DEPARTMENT_OTHER): Payer: Self-pay | Admitting: *Deleted

## 2022-07-07 ENCOUNTER — Other Ambulatory Visit: Payer: Self-pay

## 2022-07-07 ENCOUNTER — Emergency Department (HOSPITAL_BASED_OUTPATIENT_CLINIC_OR_DEPARTMENT_OTHER)
Admission: EM | Admit: 2022-07-07 | Discharge: 2022-07-07 | Disposition: A | Discharge status: Home / Self Care | Payer: BC Managed Care – PPO | Attending: Emergency Medicine | Admitting: Emergency Medicine

## 2022-07-07 ENCOUNTER — Emergency Department (HOSPITAL_BASED_OUTPATIENT_CLINIC_OR_DEPARTMENT_OTHER): Payer: BC Managed Care – PPO | Admitting: Radiology

## 2022-07-07 DIAGNOSIS — M25561 Pain in right knee: Secondary | ICD-10-CM | POA: Insufficient documentation

## 2022-07-07 MED ORDER — MELOXICAM 7.5 MG PO TABS: 7.5000 mg | ORAL_TABLET | Freq: Every day | ORAL | 0 refills | Status: DC

## 2022-07-07 MED ORDER — LIDOCAINE 5 % EX PTCH: 1.0000 | MEDICATED_PATCH | CUTANEOUS | 0 refills | Status: DC

## 2022-07-07 NOTE — Discharge Instructions (Signed)
It was a pleasure taking care of you here in the emergency department  As discussed in the room it looks like you have some calcifications on the superior (top) aspect of your knee however these appear chronic and old.  You have no evidence of fluid in your knee to suggest infection or significant inflammation. We have placed you in a splint for comfort and written you for anti-inflammatory medication.  Make sure to follow-up with an orthopedist if your symptoms do not improve after 1 week.  Return for new or worsening symptoms such as severe swelling, redness, fever

## 2022-07-07 NOTE — ED Provider Notes (Signed)
[place] [facility]ERGENCY DEPARTMENT AT [facility] Provider Note   CSN: [id number] Arrival date & time: 07/07/22  1239     History  Chief Complaint  Patient presents with   Knee Pain    Katrina Carlson is a 37 y.o. female with past medical history here for evaluation of right knee pain.  No known injury however began to hurt after she stood up from the couch on Sunday, 5 days PTA.  No redness, warmth.  Intermittently taking ibuprofen at home.  Pain worse with ambulation.  Pain located diffusely to anterior right knee.  She has no posterior knee pain, calf pain, foot pain, fever.  No numbness or weakness.  No redness or warmth.  No history of PE, DVT, recent surgery, immobilization or malignancy.   Patient denies chance of pregnancy  HPI     Home Medications Prior to Admission medications   Medication Sig Start Date End Date Taking? Authorizing Provider  lidocaine (LIDODERM) 5 % Place 1 patch onto the skin daily. Remove & Discard patch within 12 hours or as directed by MD 07/07/22  Yes Sabree Nuon A, PA-C  meloxicam (MOBIC) 7.5 MG tablet Take 1 tablet (7.5 mg total) by mouth daily. 07/07/22  Yes Genevieve Ritzel A, PA-C  Cholecalciferol (VITAMIN D3 ADULT GUMMIES) 25 MCG (1000 UT) CHEW 5,000 IU qd weekdays, and 10K IU on weekend 07/07/21   Thomasene Lot, DO  lisinopril (ZESTRIL) 5 MG tablet TAKE 1 TABLET(5 MG) BY MOUTH DAILY 06/19/22   Irene Limbo, FNP  ondansetron (ZOFRAN) 4 MG tablet Take 1 tablet (4 mg total) by mouth every 8 (eight) hours as needed for nausea or vomiting. 01/02/22   Irene Limbo, FNP  Turmeric (QC TUMERIC COMPLEX PO) Take by mouth.    [provider]  Vitamin D, Ergocalciferol, (DRISDOL) 1.25 MG (50000 UNIT) CAPS capsule Take 1 capsule (50,000 Units total) by mouth every 7 (seven) days. 06/27/22   Irene Limbo, FNP      Allergies    Patient has no known allergies.    Review of Systems   Review of Systems   Constitutional: Negative.   HENT: Negative.    Respiratory: Negative.    Cardiovascular: Negative.   Gastrointestinal: Negative.   Genitourinary: Negative.   Musculoskeletal:  Negative for arthralgias, back pain, gait problem, joint swelling, myalgias, neck pain and neck stiffness.       Right knee pain  Skin: Negative.   Neurological: Negative.   All other systems reviewed and are negative.   Physical Exam Updated Vital Signs BP 131/71 (BP Location: Right Arm)   Pulse 71   Temp 98.4 F (36.9 C) (Oral)   Resp 16   Wt (!) 148.6 kg   LMP  (LMP Unknown)   SpO2 97%   BMI 51.33 kg/m  Physical Exam Vitals and nursing note reviewed.  Constitutional:      General: She is not in acute distress.    Appearance: She is well-developed. She is not ill-appearing, toxic-appearing or diaphoretic.  HENT:     Head: Normocephalic and atraumatic.  Eyes:     Pupils: Pupils are equal, round, and reactive to light.  Cardiovascular:     Rate and Rhythm: Normal rate. No extrasystoles are present.    Pulses: Normal pulses.          Dorsalis pedis pulses are 2+ on the right side.       Posterior tibial pulses are 2+ on the right side.  Pulmonary:  Effort: No respiratory distress.  Abdominal:     General: There is no distension.  Musculoskeletal:        General: Normal range of motion.     Cervical back: Normal range of motion.     Right upper leg: Normal.     Right lower leg: Normal.     Left lower leg: Normal.     Right ankle: Normal.     Right foot: Normal.       Legs:     Comments: Nontender femur, tib-fib, right foot.  Mild diffuse tenderness anteriorly to right knee.  Nontender popliteal fossa.  Able to flex and extend.  Her compartments are soft.  Negative Homans' sign bilaterally.  No obvious joint effusion.  Straight leg raise wo difficulty  Feet:     Right foot:     Skin integrity: Skin integrity normal.     Left foot:     Skin integrity: Skin integrity normal.  Skin:     General: Skin is warm and dry.     Capillary Refill: Capillary refill takes less than 2 seconds.     Comments: No overlying erythema, warmth, rashes or lesions.  No edema, crepitus.  Neurological:     General: No focal deficit present.     Mental Status: She is alert.     Comments: Intact sensation Ambulatory with limp  Psychiatric:        Mood and Affect: Mood normal.     ED Results / Procedures / Treatments   Labs (all labs ordered are listed, but only abnormal results are displayed) Labs Reviewed - No data to display  EKG None  Radiology DG Knee Complete 4 Views Right  Result Date: 07/07/2022 CLINICAL DATA:  Right knee pain after getting up from sulfa. EXAM: RIGHT KNEE - COMPLETE 4+ VIEW COMPARISON:  Right knee radiographs 01/04/2017 FINDINGS: Normal bone mineralization. Joint spaces are preserved. No joint effusion. Mild chronic enthesopathic change at the quadriceps insertion on the patella. No acute fracture or dislocation. IMPRESSION: Mild chronic enthesopathic change at the quadriceps insertion on the patella. Electronically Signed   By: Neita Garnet M.D.   On: 07/07/2022 13:34    Procedures .Ortho Injury Treatment  Date/Time: 07/07/2022 3:49 PM  Performed by: Linwood Dibbles, PA-C Authorized by: Linwood Dibbles, PA-C   Consent:    Consent obtained:  Verbal   Consent given by:  Patient   Risks discussed:  Fracture, restricted joint movement, vascular damage, stiffness, nerve damage, recurrent dislocation and irreducible dislocation   Alternatives discussed:  No treatment, alternative treatment, immobilization, referral and delayed treatmentInjury location: knee Location details: right knee Injury type: soft tissue Pre-procedure neurovascular assessment: neurovascularly intact Pre-procedure distal perfusion: normal Pre-procedure neurological function: normal Pre-procedure range of motion: normal  Anesthesia: Local anesthesia used: no  Patient sedated:  NoImmobilization: brace Splint Applied by: ED Tech Supplies used: knee sleeve. Post-procedure neurovascular assessment: post-procedure neurovascularly intact Post-procedure distal perfusion: normal Post-procedure neurological function: normal Post-procedure range of motion: normal       Medications Ordered in ED Medications - No data to display  ED Course/ Medical Decision Making/ A&P   37 year old here for evaluation of right knee pain.  Began on Sunday after getting up from couch.  She denies any known trauma.  She is afebrile, nonseptic, not ill-appearing.  Neurovascularly intact, compartments soft, low suspicion for VTE, compartment syndrome.  She has equal pulses bilaterally low suspicion for arterial occlusion, claudication.  No overlying erythema, warmth to  suggest cellulitis, abscess, necrotizing fasciitis.  No effusion low suspicion for septic joint, hemarthrosis, bursitis.  Nontender to popliteal fossa low suspicion for popliteal cyst, DVT.  She is diffusely tender to knee, not specific to any joint line or meniscus.  Can straight leg raise, low suspicion for patellar tendon disruption, quad rupture. No bony tenderness to tib-fib, femur, foot.  Denies chance of pregnancy. Will plan on x-ray and reassess.  Imaging personally viewed and interpreted:  X-ray right knee with chronic enthesopathic change at quadriceps insertion on patella, no effusion, fracture  I discussed results with patient.  She was placed in a knee sleeve for comfort.  Will write for anti-inflammatory, lidocaine patches.  Discussed if any possibility of pregnancy do not take these medications.  She voices understanding and denies chance of pregnancy.  Will have her follow-up with orthopedics or PCP if her symptoms do not improved. Will return for new or worsening symptoms.  The patient has been appropriately medically screened and/or stabilized in the ED. I have low suspicion for any other emergent medical  condition which would require further screening, evaluation or treatment in the ED or require inpatient management.  Patient is hemodynamically stable and in no acute distress.  Patient able to ambulate in department prior to ED.  Evaluation does not show acute pathology that would require ongoing or additional emergent interventions while in the emergency department or further inpatient treatment.  I have discussed the diagnosis with the patient and answered all questions.  Pain is been managed while in the emergency department and patient has no further complaints prior to discharge.  Patient is comfortable with plan discussed in room and is stable for discharge at this time.  I have discussed strict return precautions for returning to the emergency department.  Patient was encouraged to follow-up with PCP/specialist refer to at discharge.                             Medical Decision Making Amount and/or Complexity of Data Reviewed External Data Reviewed: labs, radiology and notes. Radiology: ordered and independent interpretation performed. Decision-making details documented in ED Course.  Risk OTC drugs. Prescription drug management. Decision regarding hospitalization. Diagnosis or treatment significantly limited by social determinants of health.           Final Clinical Impression(s) / ED Diagnoses Final diagnoses:  Acute pain of right knee    Rx / DC Orders ED Discharge Orders          Ordered    meloxicam (MOBIC) 7.5 MG tablet  Daily        07/07/22 1547    lidocaine (LIDODERM) 5 %  Every 24 hours        07/07/22 1547              Makinzee Durley A, PA-C 07/07/22 1553    Rexford Maus, DO 07/11/22 [phone]

## 2022-07-07 NOTE — ED Triage Notes (Signed)
Right knee pain since Sunday night. Not associated with any trauma, pt got off of a couch and noticed that her knee hurt and it has been getting worse since.  No relief with at home measures, ambulatory with a limp.

## 2022-08-02 ENCOUNTER — Ambulatory Visit: Payer: BC Managed Care – PPO | Admitting: Nurse Practitioner

## 2022-08-02 ENCOUNTER — Encounter: Payer: Self-pay | Admitting: Nurse Practitioner

## 2022-08-02 VITALS — BP 133/88 | HR 79 | Temp 98.4°F | Ht 67.0 in | Wt 315.0 lb

## 2022-08-02 DIAGNOSIS — Z6841 Body Mass Index (BMI) 40.0 and over, adult: Secondary | ICD-10-CM | POA: Diagnosis not present

## 2022-08-02 DIAGNOSIS — E119 Type 2 diabetes mellitus without complications: Secondary | ICD-10-CM | POA: Diagnosis not present

## 2022-08-02 DIAGNOSIS — Z7985 Long-term (current) use of injectable non-insulin antidiabetic drugs: Secondary | ICD-10-CM

## 2022-08-02 DIAGNOSIS — E559 Vitamin D deficiency, unspecified: Secondary | ICD-10-CM | POA: Diagnosis not present

## 2022-08-02 MED ORDER — SEMAGLUTIDE (1 MG/DOSE) 4 MG/3ML ~~LOC~~ SOPN: 1.0000 mg | PEN_INJECTOR | SUBCUTANEOUS | 0 refills | Status: DC

## 2022-08-02 NOTE — Progress Notes (Signed)
Office: [phone]  /  Fax: [phone]  WEIGHT SUMMARY AND BIOMETRICS  Weight Lost Since Last Visit: 13lb  No data recorded  Vitals Temp: 98.4 F (36.9 C) BP: 133/88 Pulse Rate: 79 SpO2: 98 %   Anthropometric Measurements Height: 5\' 7"  (1.702 m) Weight: (!) 315 lb (142.9 kg) BMI (Calculated): 49.32 Weight at Last Visit: 328lb Weight Lost Since Last Visit: 13lb Starting Weight: 331lb Total Weight Loss (lbs): 16 lb (7.258 kg)   Body Composition  Body Fat %: 49.5 % Fat Mass (lbs): 156.4 lbs Muscle Mass (lbs): 151.4 lbs Total Body Water (lbs): 109.6 lbs Visceral Fat Rating : 16   Other Clinical Data Fasting: No Labs: No Today's Visit #: 27 Starting Date: 04/29/19     HPI  Chief Complaint: OBESITY  Katrina Carlson is here to discuss her progress with her obesity treatment plan. She is on the the Category 4 Plan and states she is following her eating plan approximately 80 % of the time. She states she is exercising 0 minutes 0 days per week.   Interval History:  Since last office visit she has lost 13 pounds.  She has been trying to make healthier choices and watching her portion sizes.  She has been drinking more water.  Has reduced her sugary drinks.     Pharmacotherapy for weight loss: She is not currently taking medications  for medical weight loss.    Previous pharmacotherapy for medical weight loss:  [place]  She wasn't able to increase Wegovy due to [place] shortage.  Now unable to take due to cost    Bariatric surgery:  Has  not had bariatric surgery.    Pharmacotherapy for DMT2:  She is currently taking Ozempic 0.5mg .  Denies side effects.   Last A1c was 6.1 She is not checking BS at home.   Episodes of hypoglycemia: no On ACE  Last eye exam:  2024   Lab Results  Component Value Date   HGBA1C 6.1 (H) 06/27/2022   HGBA1C 6.3 (H) 03/22/2022   HGBA1C 7.3 (H) 12/14/2021   Lab Results  Component Value Date   LDLCALC 87 12/14/2021    CREATININE 0.65 06/27/2022     Vit D deficiency  She is not taking Vit D 50,000 IU.      Lab Results  Component Value Date   VD25OH 6.9 (L) 06/27/2022   VD25OH 7.4 (L) 03/22/2022   VD25OH 8.1 (L) 12/14/2021    PHYSICAL EXAM:  Blood pressure 133/88, pulse 79, temperature 98.4 F (36.9 C), height 5\' 7"  (1.702 m), weight (!) 315 lb (142.9 kg), SpO2 98 %. Body mass index is 49.34 kg/m.  General: She is overweight, cooperative, alert, well developed, and in no acute distress. PSYCH: Has normal mood, affect and thought process.   Extremities: No edema.  Neurologic: No gross sensory or motor deficits. No tremors or fasciculations noted.    DIAGNOSTIC DATA REVIEWED:  BMET    Component Value Date/Time   NA 136 06/27/2022 1229   K 4.6 06/27/2022 1229   CL 100 06/27/2022 1229   CO2 23 06/27/2022 1229   GLUCOSE 114 (H) 06/27/2022 1229   GLUCOSE 199 (H) 12/02/2018 0240   BUN 5 (L) 06/27/2022 1229   CREATININE 0.65 06/27/2022 1229   CALCIUM 9.2 06/27/2022 1229   GFRNONAA 119 09/23/2019 1202   GFRAA 138 09/23/2019 1202   Lab Results  Component Value Date   HGBA1C 6.1 (H) 06/27/2022   HGBA1C 5.6 09/23/2019   Lab  Results  Component Value Date   INSULIN 99.6 (H) 03/22/2022   INSULIN 166.0 (H) 09/23/2019   Lab Results  Component Value Date   TSH 1.700 12/14/2021   CBC    Component Value Date/Time   WBC 9.3 12/14/2021 1619   WBC 13.7 (H) 12/03/2018 0620   RBC 4.83 12/14/2021 1619   RBC 5.28 (H) 12/03/2018 0620   HGB 14.7 12/14/2021 1619   HCT 43.6 12/14/2021 1619   PLT 323 12/14/2021 1619   MCV 90 12/14/2021 1619   MCH 30.4 12/14/2021 1619   MCH 29.9 12/03/2018 0620   MCHC 33.7 12/14/2021 1619   MCHC 32.3 12/03/2018 0620   RDW 12.3 12/14/2021 1619   Iron Studies    Component Value Date/Time   IRON 54 05/26/2020 1011   TIBC 318 05/26/2020 1011   FERRITIN 77 05/26/2020 1011   IRONPCTSAT 17 05/26/2020 1011   Lipid Panel     Component Value Date/Time    CHOL 144 12/14/2021 1619   TRIG 125 12/14/2021 1619   HDL 34 (L) 12/14/2021 1619   CHOLHDL 4.0 02/01/2021 1146   LDLCALC 87 12/14/2021 1619   Hepatic Function Panel     Component Value Date/Time   PROT 7.6 06/27/2022 1229   ALBUMIN 3.9 06/27/2022 1229   AST 22 06/27/2022 1229   ALT 18 06/27/2022 1229   ALKPHOS 66 06/27/2022 1229   BILITOT 0.5 06/27/2022 1229      Component Value Date/Time   TSH 1.700 12/14/2021 1619   Nutritional Lab Results  Component Value Date   VD25OH 6.9 (L) 06/27/2022   VD25OH 7.4 (L) 03/22/2022   VD25OH 8.1 (L) 12/14/2021     ASSESSMENT AND PLAN  TREATMENT PLAN FOR OBESITY:  Recommended Dietary Goals  Katrina Carlson is currently in the action stage of change. As such, her goal is to continue weight management plan. She has agreed to the Category 4 Plan.  Behavioral Intervention  We discussed the following Behavioral Modification Strategies today: increasing lean protein intake, decreasing simple carbohydrates , increasing vegetables, increasing lower glycemic fruits, increasing water intake, reading food labels , keeping healthy foods at home, continue to practice mindfulness when eating, and planning for success.  Additional resources provided today: NA  Recommended Physical Activity Goals  Katrina Carlson has been advised to work up to 150 minutes of moderate intensity aerobic activity a week and strengthening exercises 2-3 times per week for cardiovascular health, weight loss maintenance and preservation of muscle mass.   She has agreed to Think about ways to increase daily physical activity and overcoming barriers to exercise, Increase the intensity, frequency or duration of aerobic exercises  , and Increase physical activity in their day and reduce sedentary time (increase NEAT).   ASSOCIATED CONDITIONS ADDRESSED TODAY  Action/Plan  Vitamin D deficiency Take Vit D as directed.  Labs worsening.  Discussed extensively with patient today.    Type 2  diabetes mellitus without complication, without long-term current use of insulin (HCC) -     Semaglutide (1 MG/DOSE); Inject 1 mg as directed once a week.  Dispense: 3 mL; Refill: 0.  Side effects discussed.  Patient to take 0.5mg  until next visit.    Morbid obesity (HCC)  BMI 45.0-49.9, adult (HCC)      Labs reviewed with patient in chart from 06/27/22   Return in about 4 weeks (around 08/30/2022).Marland Kitchen She was informed of the importance of frequent follow up visits to maximize her success with intensive lifestyle modifications for her multiple health  conditions.   ATTESTASTION STATEMENTS:  Reviewed by clinician on day of visit: allergies, medications, problem list, medical history, surgical history, family history, social history, and previous encounter notes.      Katrina Carlson. Kitzia Camus FNP-C

## 2022-09-05 ENCOUNTER — Inpatient Hospital Stay (HOSPITAL_BASED_OUTPATIENT_CLINIC_OR_DEPARTMENT_OTHER)
Admission: EM | Admit: 2022-09-05 | Discharge: 2022-09-08 | DRG: 418 | Disposition: A | Discharge status: Home / Self Care | Payer: BC Managed Care – PPO | Attending: General Surgery | Admitting: General Surgery

## 2022-09-05 ENCOUNTER — Other Ambulatory Visit (HOSPITAL_BASED_OUTPATIENT_CLINIC_OR_DEPARTMENT_OTHER): Payer: Self-pay

## 2022-09-05 ENCOUNTER — Other Ambulatory Visit: Payer: Self-pay

## 2022-09-05 ENCOUNTER — Emergency Department (HOSPITAL_BASED_OUTPATIENT_CLINIC_OR_DEPARTMENT_OTHER): Payer: BC Managed Care – PPO

## 2022-09-05 ENCOUNTER — Ambulatory Visit: Payer: BC Managed Care – PPO | Admitting: Nurse Practitioner

## 2022-09-05 ENCOUNTER — Encounter (HOSPITAL_BASED_OUTPATIENT_CLINIC_OR_DEPARTMENT_OTHER): Payer: Self-pay | Admitting: Emergency Medicine

## 2022-09-05 DIAGNOSIS — Z791 Long term (current) use of non-steroidal anti-inflammatories (NSAID): Secondary | ICD-10-CM | POA: Diagnosis not present

## 2022-09-05 DIAGNOSIS — R7303 Prediabetes: Secondary | ICD-10-CM | POA: Diagnosis present

## 2022-09-05 DIAGNOSIS — Z6841 Body Mass Index (BMI) 40.0 and over, adult: Secondary | ICD-10-CM | POA: Diagnosis not present

## 2022-09-05 DIAGNOSIS — Z7985 Long-term (current) use of injectable non-insulin antidiabetic drugs: Secondary | ICD-10-CM | POA: Diagnosis not present

## 2022-09-05 DIAGNOSIS — K810 Acute cholecystitis: Secondary | ICD-10-CM | POA: Diagnosis not present

## 2022-09-05 DIAGNOSIS — K8000 Calculus of gallbladder with acute cholecystitis without obstruction: Secondary | ICD-10-CM | POA: Diagnosis not present

## 2022-09-05 DIAGNOSIS — Z79899 Other long term (current) drug therapy: Secondary | ICD-10-CM

## 2022-09-05 DIAGNOSIS — Z833 Family history of diabetes mellitus: Secondary | ICD-10-CM

## 2022-09-05 DIAGNOSIS — K8020 Calculus of gallbladder without cholecystitis without obstruction: Secondary | ICD-10-CM | POA: Diagnosis present

## 2022-09-05 LAB — URINALYSIS, ROUTINE W REFLEX MICROSCOPIC
Bilirubin Urine: NEGATIVE
Glucose, UA: NEGATIVE mg/dL
Hgb urine dipstick: NEGATIVE
Ketones, ur: NEGATIVE mg/dL
Leukocytes,Ua: NEGATIVE
Nitrite: NEGATIVE
Protein, ur: NEGATIVE mg/dL
Specific Gravity, Urine: 1.043 — ABNORMAL HIGH (ref 1.005–1.030)
pH: 6.5 (ref 5.0–8.0)

## 2022-09-05 LAB — CBC
HCT: 43.6 % (ref 36.0–46.0)
Hemoglobin: 14.6 g/dL (ref 12.0–15.0)
MCH: 30.7 pg (ref 26.0–34.0)
MCHC: 33.5 g/dL (ref 30.0–36.0)
MCV: 91.6 fL (ref 80.0–100.0)
Platelets: 323 10*3/uL (ref 150–400)
RBC: 4.76 MIL/uL (ref 3.87–5.11)
RDW: 12.8 % (ref 11.5–15.5)
WBC: 9.2 10*3/uL (ref 4.0–10.5)
nRBC: 0 % (ref 0.0–0.2)

## 2022-09-05 LAB — COMPREHENSIVE METABOLIC PANEL
ALT: 12 U/L (ref 0–44)
AST: 14 U/L — ABNORMAL LOW (ref 15–41)
Albumin: 3.8 g/dL (ref 3.5–5.0)
Alkaline Phosphatase: 48 U/L (ref 38–126)
Anion gap: 7 (ref 5–15)
BUN: 8 mg/dL (ref 6–20)
CO2: 25 mmol/L (ref 22–32)
Calcium: 9.1 mg/dL (ref 8.9–10.3)
Chloride: 103 mmol/L (ref 98–111)
Creatinine, Ser: 0.72 mg/dL (ref 0.44–1.00)
GFR, Estimated: >60 mL/min (ref >60)
Glucose, Bld: 122 mg/dL — ABNORMAL HIGH (ref 70–99)
Potassium: 3.6 mmol/L (ref 3.5–5.1)
Sodium: 135 mmol/L (ref 135–145)
Total Bilirubin: 0.5 mg/dL (ref 0.3–1.2)
Total Protein: 7.8 g/dL (ref 6.5–8.1)

## 2022-09-05 LAB — HIV ANTIBODY (ROUTINE TESTING W REFLEX): HIV Screen 4th Generation wRfx: NONREACTIVE

## 2022-09-05 LAB — LIPASE, BLOOD: Lipase: 29 U/L (ref 11–51)

## 2022-09-05 LAB — HCG, SERUM, QUALITATIVE: Preg, Serum: NEGATIVE

## 2022-09-05 MED ORDER — SODIUM CHLORIDE 0.9 % IV SOLN
2.0000 g | INTRAVENOUS | Status: DC
  Administered 2022-09-06 – 2022-09-07 (×2): 2 g via INTRAVENOUS
  Filled 2022-09-05 (×2): qty 20

## 2022-09-05 MED ORDER — SODIUM CHLORIDE 0.9 % IV BOLUS
1000.0000 mL | Freq: Once | INTRAVENOUS | Status: AC
  Administered 2022-09-05: 1000 mL via INTRAVENOUS

## 2022-09-05 MED ORDER — SODIUM CHLORIDE 0.9 % IV SOLN: INTRAVENOUS | Status: DC

## 2022-09-05 MED ORDER — ONDANSETRON 4 MG PO TBDP
4.0000 mg | ORAL_TABLET | Freq: Four times a day (QID) | ORAL | Status: DC | PRN
  Administered 2022-09-05: 4 mg via ORAL
  Filled 2022-09-05: qty 1

## 2022-09-05 MED ORDER — LISINOPRIL 5 MG PO TABS
5.0000 mg | ORAL_TABLET | Freq: Every day | ORAL | Status: DC
  Administered 2022-09-05 – 2022-09-08 (×3): 5 mg via ORAL
  Filled 2022-09-05: qty 1
  Filled 2022-09-05 (×3): qty 0.5
  Filled 2022-09-05: qty 1

## 2022-09-05 MED ORDER — ONDANSETRON HCL 4 MG/2ML IJ SOLN
4.0000 mg | Freq: Once | INTRAMUSCULAR | Status: AC
  Administered 2022-09-05: 4 mg via INTRAVENOUS
  Filled 2022-09-05: qty 2

## 2022-09-05 MED ORDER — HYDROMORPHONE HCL 1 MG/ML IJ SOLN
1.0000 mg | Freq: Once | INTRAMUSCULAR | Status: AC
  Administered 2022-09-05: 1 mg via INTRAVENOUS
  Filled 2022-09-05: qty 1

## 2022-09-05 MED ORDER — ONDANSETRON HCL 4 MG/2ML IJ SOLN
4.0000 mg | Freq: Four times a day (QID) | INTRAMUSCULAR | Status: DC | PRN
  Administered 2022-09-06 – 2022-09-07 (×3): 4 mg via INTRAVENOUS
  Filled 2022-09-05 (×4): qty 2

## 2022-09-05 MED ORDER — PANTOPRAZOLE SODIUM 40 MG IV SOLR
40.0000 mg | Freq: Every day | INTRAVENOUS | Status: DC
  Administered 2022-09-05 – 2022-09-07 (×3): 40 mg via INTRAVENOUS
  Filled 2022-09-05 (×3): qty 10

## 2022-09-05 MED ORDER — MORPHINE SULFATE (PF) 4 MG/ML IV SOLN
4.0000 mg | Freq: Once | INTRAVENOUS | Status: AC
  Administered 2022-09-05: 4 mg via INTRAVENOUS
  Filled 2022-09-05: qty 1

## 2022-09-05 MED ORDER — ENOXAPARIN SODIUM 40 MG/0.4ML IJ SOSY
40.0000 mg | PREFILLED_SYRINGE | INTRAMUSCULAR | Status: DC
  Administered 2022-09-05 – 2022-09-07 (×3): 40 mg via SUBCUTANEOUS
  Filled 2022-09-05 (×3): qty 0.4

## 2022-09-05 MED ORDER — MORPHINE SULFATE (PF) 2 MG/ML IV SOLN
1.0000 mg | INTRAVENOUS | Status: DC | PRN
  Administered 2022-09-05 (×2): 4 mg via INTRAVENOUS
  Filled 2022-09-05 (×2): qty 2

## 2022-09-05 MED ORDER — IOHEXOL 300 MG/ML  SOLN
100.0000 mL | Freq: Once | INTRAMUSCULAR | Status: AC | PRN
  Administered 2022-09-05: 100 mL via INTRAVENOUS

## 2022-09-05 MED ORDER — SODIUM CHLORIDE 0.9 % IV SOLN
2.0000 g | Freq: Once | INTRAVENOUS | Status: AC
  Administered 2022-09-05: 2 g via INTRAVENOUS
  Filled 2022-09-05: qty 20

## 2022-09-05 MED ORDER — ALUM & MAG HYDROXIDE-SIMETH 200-200-20 MG/5ML PO SUSP
30.0000 mL | Freq: Once | ORAL | Status: AC
  Administered 2022-09-05: 30 mL via ORAL
  Filled 2022-09-05: qty 30

## 2022-09-05 NOTE — H&P (Signed)
Katrina Carlson is an 37 y.o. female.   Chief Complaint: Abdominal pain HPI: The patient is a 37 year old black female who began having upper abdominal pain a couple days ago.  The pain has persisted and seem to get a little worse.  The pain was associated today with some nausea and vomiting.  She came to the emergency department where a CT scan showed a large stone in the gallbladder and some evidence of some mild gallbladder wall thickening.  Her liver functions were normal.  Her white count was normal.  She denies any fevers or chills.  Past Medical History:  Diagnosis Date   Obesity    Prediabetes     Past Surgical History:  Procedure Laterality Date   FRACTURE SURGERY     WRIST SURGERY      Family History  Problem Relation Age of Onset   Breast cancer Mother    Obesity Mother    Diabetes Father    Hypertension Father    Depression Father    Anxiety disorder Father    Bipolar disorder Father    Sleep apnea Father    Obesity Father    Social History:  reports that she has never smoked. She has never used smokeless tobacco. She reports that she does not drink alcohol and does not use drugs.  Allergies: No Known Allergies  (Not in a hospital admission)   Results for orders placed or performed during the hospital encounter of 09/05/22 (from the past 48 hour(s))  Lipase, blood     Status: None   Collection Time: 09/05/22  8:48 AM  Result Value Ref Range   Lipase 29 11 - 51 U/L    Comment: Performed at [facility], [address], [place], [place] [address]  Comprehensive metabolic panel     Status: Abnormal   Collection Time: 09/05/22  8:48 AM  Result Value Ref Range   Sodium 135 135 - 145 mmol/L   Potassium 3.6 3.5 - 5.1 mmol/L   Chloride 103 98 - 111 mmol/L   CO2 25 22 - 32 mmol/L   Glucose, Bld 122 (H) 70 - 99 mg/dL    Comment: Glucose reference range applies only to samples taken after fasting for at least 8 hours.   BUN 8 6 - 20 mg/dL    Creatinine, Ser 6.04 0.44 - 1.00 mg/dL   Calcium 9.1 8.9 - 54.0 mg/dL   Total Protein 7.8 6.5 - 8.1 g/dL   Albumin 3.8 3.5 - 5.0 g/dL   AST 14 (L) 15 - 41 U/L   ALT 12 0 - 44 U/L   Alkaline Phosphatase 48 38 - 126 U/L   Total Bilirubin 0.5 0.3 - 1.2 mg/dL   GFR, Estimated >98 >11 mL/min    Comment: (NOTE) Calculated using the CKD-EPI Creatinine Equation (2021)    Anion gap 7 5 - 15    Comment: Performed at [facility], [address], [place], [place] [address]  CBC     Status: None   Collection Time: 09/05/22  8:48 AM  Result Value Ref Range   WBC 9.2 4.0 - 10.5 K/uL   RBC 4.76 3.87 - 5.11 MIL/uL   Hemoglobin 14.6 12.0 - 15.0 g/dL   HCT 29.5 62.1 - 30.8 %   MCV 91.6 80.0 - 100.0 fL   MCH 30.7 26.0 - 34.0 pg   MCHC 33.5 30.0 - 36.0 g/dL   RDW 65.7 84.6 - 96.2 %   Platelets 323 150 -  400 K/uL   nRBC 0.0 0.0 - 0.2 %    Comment: Performed at [facility], [address] [place], [place] [address]  hCG, serum, qualitative     Status: None   Collection Time: 09/05/22  8:48 AM  Result Value Ref Range   Preg, Serum NEGATIVE NEGATIVE    Comment:        THE SENSITIVITY OF THIS METHODOLOGY IS >10 mIU/mL. Performed at [facility], [address], [place], [place] [address]   Urinalysis, Routine w reflex microscopic -Urine, Clean Catch     Status: Abnormal   Collection Time: 09/05/22 11:21 AM  Result Value Ref Range   Color, Urine COLORLESS (A) YELLOW   APPearance CLEAR CLEAR   Specific Gravity, Urine 1.043 (H) 1.005 - 1.030   pH 6.5 5.0 - 8.0   Glucose, UA NEGATIVE NEGATIVE mg/dL   Hgb urine dipstick NEGATIVE NEGATIVE   Bilirubin Urine NEGATIVE NEGATIVE   Ketones, ur NEGATIVE NEGATIVE mg/dL   Protein, ur NEGATIVE NEGATIVE mg/dL   Nitrite NEGATIVE NEGATIVE   Leukocytes,Ua NEGATIVE NEGATIVE    Comment: Performed at [facility], [address] [place], [place] [address]   US Abdomen Limited RUQ  (LIVER/GB)  Result Date: 09/05/2022 CLINICAL DATA:  Right upper quadrant abdominal pain. EXAM: ULTRASOUND ABDOMEN LIMITED RIGHT UPPER QUADRANT COMPARISON:  Abdominal CT same date. FINDINGS: Gallbladder: As seen on CT, there is a large peripherally calcified gallstone which measures up to 4.7 cm. There is sludge within the gallbladder lumen with gallbladder wall thickening and a positive sonographic Murphy sign. Common bile duct: Diameter: 7 mm. No evidence of choledocholithiasis or intrahepatic biliary dilatation. Liver: Mildly increased hepatic echogenicity, likely due to steatosis. No focal abnormality identified. Portal vein is patent on color Doppler imaging with normal direction of blood flow towards the liver. Other: None. IMPRESSION: 1. Cholelithiasis with gallbladder wall thickening and positive sonographic Murphy sign concerning for acute cholecystitis. 2. Mild dilatation of the common bile duct without evidence of choledocholithiasis. 3. Mild hepatic steatosis. Electronically Signed   By: Carey Bullocks M.D.   On: 09/05/2022 13:45   CT ABDOMEN PELVIS W CONTRAST  Result Date: 09/05/2022 CLINICAL DATA:  Abdominal pain x2 days EXAM: CT ABDOMEN AND PELVIS WITH CONTRAST TECHNIQUE: Multidetector CT imaging of the abdomen and pelvis was performed using the standard protocol following bolus administration of intravenous contrast. RADIATION DOSE REDUCTION: This exam was performed according to the departmental dose-optimization program which includes automated exposure control, adjustment of the mA and/or kV according to patient size and/or use of iterative reconstruction technique. CONTRAST:  OMNIPAQUE IOHEXOL 300 MG/ML  SOLN COMPARISON:  None Available. FINDINGS: Lower chest: Breathing motion limits evaluation of lower lung fields. Small linear densities are seen in lingula suggesting subsegmental atelectasis. There is no focal consolidation. Hepatobiliary: No focal abnormalities are seen in liver.  There is no dilation of bile ducts. There is 2.5 cm ring-like calcified gallstone in the neck of the gallbladder. There is layering high density in the fundus of the gallbladder suggesting tiny gallbladder stones and sludge. There is no wall thickening in gallbladder. Pancreas: No focal abnormalities are seen. Spleen: Unremarkable. Adrenals/Urinary Tract: Adrenals are not enlarged. There is 9 mm fatty nodule in right adrenal, possibly adrenal myelolipoma. There is no hydronephrosis. There are no renal or ureteral stones. Urinary bladder is not distended. Stomach/Bowel: Stomach is unremarkable. Small bowel loops are not dilated. Appendix is not dilated. There is no significant wall thickening in colon.  There is no pericolic stranding. Vascular/Lymphatic: Unremarkable. Reproductive: Unremarkable. Other: There is no ascites or pneumoperitoneum. Musculoskeletal: Degenerative changes are noted at the L5-S1 level with encroachment of neural foramina. IMPRESSION: There is no evidence of intestinal obstruction or pneumoperitoneum. Appendix is not dilated. There is no hydronephrosis. There is large gallbladder stone in the neck of the gallbladder. Sludge and tiny stones are seen in the lumen of the fundus of the gallbladder. There is no dilation of bile ducts. 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 Lumbar spondylosis. Electronically Signed   By: Ernie Avena M.D.   On: 09/05/2022 11:37    Review of Systems  Constitutional: Negative.   HENT: Negative.    Eyes: Negative.   Respiratory: Negative.    Cardiovascular: Negative.   Gastrointestinal:  Positive for abdominal pain, nausea and vomiting.  Endocrine: Negative.   Genitourinary: Negative.   Musculoskeletal: Negative.   Skin: Negative.   Allergic/Immunologic: Negative.   Neurological: Negative.   Hematological: Negative.   Psychiatric/Behavioral: Negative.      Blood pressure (!) 170/102, pulse 74, temperature  98.2 F (36.8 C), temperature source Oral, resp. rate 17, height 5\' 7"  (1.702 m), weight (!) 142 kg, SpO2 93%. Physical Exam Vitals reviewed.  Constitutional:      General: She is not in acute distress.    Appearance: Normal appearance. She is obese.  HENT:     Head: Normocephalic and atraumatic.     Right Ear: External ear normal.     Left Ear: External ear normal.     Nose: Nose normal.     Mouth/Throat:     Mouth: Mucous membranes are moist.     Pharynx: Oropharynx is clear.  Eyes:     General: No scleral icterus.    Extraocular Movements: Extraocular movements intact.     Conjunctiva/sclera: Conjunctivae normal.     Pupils: Pupils are equal, round, and reactive to light.  Cardiovascular:     Rate and Rhythm: Normal rate and regular rhythm.     Pulses: Normal pulses.     Heart sounds: Normal heart sounds.  Pulmonary:     Effort: Pulmonary effort is normal. No respiratory distress.     Breath sounds: Normal breath sounds.  Abdominal:     General: Abdomen is flat.     Palpations: Abdomen is soft.     Comments: There is moderate upper abdominal tenderness.  There is no guarding or peritonitis  Musculoskeletal:        General: No swelling or deformity. Normal range of motion.     Cervical back: Normal range of motion and neck supple.  Skin:    General: Skin is warm and dry.     Coloration: Skin is not jaundiced.  Neurological:     General: No focal deficit present.     Mental Status: She is alert and oriented to person, place, and time.  Psychiatric:        Mood and Affect: Mood normal.        Behavior: Behavior normal.      Assessment/Plan The patient appears to have a large stone in the gallbladder with some mild gallbladder wall thickness.  Because of the risk of further painful episodes she would likely benefit from having her gallbladder removed.  We will admit her to the hospital and start her on some pain medicine and broad-spectrum antibiotic therapy.  We will  treat her with IV hydration.  We will plan for surgery  to remove the gallbladder as the operating room schedule allows.  She understands that this may not occur until Thursday potentially.  I have discussed with her in detail the risks and benefits of the operation as well as some of the technical aspects including the risk of common bile duct injury and she understands and wishes to proceed.  Chevis Pretty III, MD 09/05/2022, 5:08 PM

## 2022-09-05 NOTE — ED Provider Notes (Signed)
Care assumed from Dr. Adela Lank pending general surgery consultation.  See his note for full HPI.  In short, patient is a 37 year old female who presents to the ED due to upper abdominal pain that has been ongoing for the past few days.  Admits to nausea however, no vomiting.  Currently on Ozempic.  Patient concerned about her gallbladder. Physical Exam  BP (!) 170/102 (BP Location: Left Arm)   Pulse 74   Temp 98.2 F (36.8 C) (Oral)   Resp 17   Ht 5\' 7"  (1.702 m)   Wt (!) 142 kg   SpO2 93%   BMI 49.02 kg/m   Physical Exam Vitals and nursing note reviewed.  Constitutional:      General: She is not in acute distress.    Appearance: She is not ill-appearing.  HENT:     Head: Normocephalic.  Eyes:     Pupils: Pupils are equal, round, and reactive to light.  Cardiovascular:     Rate and Rhythm: Normal rate and regular rhythm.     Pulses: Normal pulses.     Heart sounds: Normal heart sounds. No murmur heard.    No friction rub. No gallop.  Pulmonary:     Effort: Pulmonary effort is normal.     Breath sounds: Normal breath sounds.  Abdominal:     General: Abdomen is flat. There is no distension.     Palpations: Abdomen is soft.     Tenderness: There is abdominal tenderness. There is no guarding or rebound.  Musculoskeletal:        General: Normal range of motion.     Cervical back: Neck supple.  Skin:    General: Skin is warm and dry.  Neurological:     General: No focal deficit present.     Mental Status: She is alert.  Psychiatric:        Mood and Affect: Mood normal.        Behavior: Behavior normal.     Procedures  Procedures  ED Course / MDM    Medical Decision Making Amount and/or Complexity of Data Reviewed Labs: ordered. Decision-making details documented in ED Course. Radiology: ordered and independent interpretation performed. Decision-making details documented in ED Course.  Risk OTC drugs. Prescription drug management. Decision regarding  hospitalization.   Reassessed patient upon arrival to the ED.  Patient admits to 8/10 pain.  Already given 3 doses of morphine.  Will give 1 dose of Dilaudid.  BP elevated likely secondary to pain. Will discuss with general surgery. Last ate last night around 10PM. Few sips of water early this morning. Upper abdominal tenderness.   No leukocytosis.  No transaminitis.  Lipase normal.  Right upper quadrant concerning for acute cholecystitis.  4:49 PM Discussed with Dr. Carolynne Edouard with general surgery who will see patient.   Patient admitted to general surgery service, Dr. Carolynne Edouard.        Jesusita Oka 09/05/22 1709    Benjiman Core, MD 09/05/22 2253

## 2022-09-05 NOTE — ED Provider Notes (Signed)
[place] EMERGENCY DEPARTMENT AT [facility] Provider Note   CSN: [id number] Arrival date & time: 09/05/22  4098     History  Chief Complaint  Patient presents with   Abdominal Pain    Katrina Carlson is a 37 y.o. female.  37 yo F with a chief complaints of upper abdominal discomfort.  Going on for couple days.  Having nausea but no vomiting.  No fevers no diarrhea.  Pain is constant.  Has not been able to eat or drink.  She is on Ozempic.  She has been researching her symptoms online and is worried that she has a issue with her gallbladder.   Abdominal Pain      Home Medications Prior to Admission medications   Medication Sig Start Date End Date Taking? Authorizing Provider  Cholecalciferol (VITAMIN D3 ADULT GUMMIES) 25 MCG (1000 UT) CHEW 5,000 IU qd weekdays, and 10K IU on weekend 07/07/21  Yes Opalski, Deborah, DO  lisinopril (ZESTRIL) 5 MG tablet TAKE 1 TABLET(5 MG) BY MOUTH DAILY 06/19/22  Yes Tickerhoff, Stephanie, FNP  Semaglutide, 1 MG/DOSE, 4 MG/3ML SOPN Inject 1 mg as directed once a week. 08/02/22  Yes Irene Limbo, FNP  Vitamin D, Ergocalciferol, (DRISDOL) 1.25 MG (50000 UNIT) CAPS capsule Take 1 capsule (50,000 Units total) by mouth every 7 (seven) days. 06/27/22  Yes Tickerhoff, Judeth Cornfield, FNP  lidocaine (LIDODERM) 5 % Place 1 patch onto the skin daily. Remove & Discard patch within 12 hours or as directed by MD Patient not taking: Reported on 09/05/2022 07/07/22   Henderly, Britni A, PA-C  meloxicam (MOBIC) 7.5 MG tablet Take 1 tablet (7.5 mg total) by mouth daily. Patient not taking: Reported on 09/05/2022 07/07/22   Henderly, Britni A, PA-C  ondansetron (ZOFRAN) 4 MG tablet Take 1 tablet (4 mg total) by mouth every 8 (eight) hours as needed for nausea or vomiting. Patient not taking: Reported on 09/05/2022 01/02/22   Irene Limbo, FNP      Allergies    Patient has no known allergies.    Review of Systems   Review of Systems   Gastrointestinal:  Positive for abdominal pain.    Physical Exam Updated Vital Signs BP (!) 179/102   Pulse 61   Temp 98.5 F (36.9 C) (Oral)   Resp 16   Ht 5\' 7"  (1.702 m)   Wt (!) 142 kg   SpO2 94%   BMI 49.02 kg/m  Physical Exam Vitals and nursing note reviewed.  Constitutional:      General: She is not in acute distress.    Appearance: She is well-developed. She is not diaphoretic.     Comments: BMI 50  HENT:     Head: Normocephalic and atraumatic.  Eyes:     Pupils: Pupils are equal, round, and reactive to light.  Cardiovascular:     Rate and Rhythm: Normal rate and regular rhythm.     Heart sounds: No murmur heard.    No friction rub. No gallop.  Pulmonary:     Effort: Pulmonary effort is normal.     Breath sounds: No wheezing or rales.  Abdominal:     General: There is no distension.     Palpations: Abdomen is soft.     Tenderness: There is no abdominal tenderness.     Comments: Difficult exam due to body habitus.  Diffusely tender but worse to the epigastrium and right upper quadrant negative Murphy sign  Musculoskeletal:        General: No  tenderness.     Cervical back: Normal range of motion and neck supple.  Skin:    General: Skin is warm and dry.  Neurological:     Mental Status: She is alert and oriented to person, place, and time.  Psychiatric:        Behavior: Behavior normal.     ED Results / Procedures / Treatments   Labs (all labs ordered are listed, but only abnormal results are displayed) Labs Reviewed  COMPREHENSIVE METABOLIC PANEL - Abnormal; Notable for the following components:      Result Value   Glucose, Bld 122 (*)    AST 14 (*)    All other components within normal limits  URINALYSIS, ROUTINE W REFLEX MICROSCOPIC - Abnormal; Notable for the following components:   Color, Urine COLORLESS (*)    Specific Gravity, Urine 1.043 (*)    All other components within normal limits  LIPASE, BLOOD  CBC  HCG, SERUM, QUALITATIVE     EKG None  Radiology US Abdomen Limited RUQ (LIVER/GB)  Result Date: 09/05/2022 CLINICAL DATA:  Right upper quadrant abdominal pain. EXAM: ULTRASOUND ABDOMEN LIMITED RIGHT UPPER QUADRANT COMPARISON:  Abdominal CT same date. FINDINGS: Gallbladder: As seen on CT, there is a large peripherally calcified gallstone which measures up to 4.7 cm. There is sludge within the gallbladder lumen with gallbladder wall thickening and a positive sonographic Murphy sign. Common bile duct: Diameter: 7 mm. No evidence of choledocholithiasis or intrahepatic biliary dilatation. Liver: Mildly increased hepatic echogenicity, likely due to steatosis. No focal abnormality identified. Portal vein is patent on color Doppler imaging with normal direction of blood flow towards the liver. Other: None. IMPRESSION: 1. Cholelithiasis with gallbladder wall thickening and positive sonographic Murphy sign concerning for acute cholecystitis. 2. Mild dilatation of the common bile duct without evidence of choledocholithiasis. 3. Mild hepatic steatosis. Electronically Signed   By: Carey Bullocks M.D.   On: 09/05/2022 13:45   CT ABDOMEN PELVIS W CONTRAST  Result Date: 09/05/2022 CLINICAL DATA:  Abdominal pain x2 days EXAM: CT ABDOMEN AND PELVIS WITH CONTRAST TECHNIQUE: Multidetector CT imaging of the abdomen and pelvis was performed using the standard protocol following bolus administration of intravenous contrast. RADIATION DOSE REDUCTION: This exam was performed according to the departmental dose-optimization program which includes automated exposure control, adjustment of the mA and/or kV according to patient size and/or use of iterative reconstruction technique. CONTRAST:  OMNIPAQUE IOHEXOL 300 MG/ML  SOLN COMPARISON:  None Available. FINDINGS: Lower chest: Breathing motion limits evaluation of lower lung fields. Small linear densities are seen in lingula suggesting subsegmental atelectasis. There is no focal consolidation.  Hepatobiliary: No focal abnormalities are seen in liver. There is no dilation of bile ducts. There is 2.5 cm ring-like calcified gallstone in the neck of the gallbladder. There is layering high density in the fundus of the gallbladder suggesting tiny gallbladder stones and sludge. There is no wall thickening in gallbladder. Pancreas: No focal abnormalities are seen. Spleen: Unremarkable. Adrenals/Urinary Tract: Adrenals are not enlarged. There is 9 mm fatty nodule in right adrenal, possibly adrenal myelolipoma. There is no hydronephrosis. There are no renal or ureteral stones. Urinary bladder is not distended. Stomach/Bowel: Stomach is unremarkable. Small bowel loops are not dilated. Appendix is not dilated. There is no significant wall thickening in colon. There is no pericolic stranding. Vascular/Lymphatic: Unremarkable. Reproductive: Unremarkable. Other: There is no ascites or pneumoperitoneum. Musculoskeletal: Degenerative changes are noted at the L5-S1 level with encroachment of neural foramina. IMPRESSION: There is  no evidence of intestinal obstruction or pneumoperitoneum. Appendix is not dilated. There is no hydronephrosis. There is large gallbladder stone in the neck of the gallbladder. Sludge and tiny stones are seen in the lumen of the fundus of the gallbladder. There is no dilation of bile ducts. 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 Lumbar spondylosis. Electronically Signed   By: Ernie Avena M.D.   On: 09/05/2022 11:37    Procedures Procedures    Medications Ordered in ED Medications  morphine (PF) 4 MG/ML injection 4 mg (has no administration in time range)  ondansetron (ZOFRAN) injection 4 mg (has no administration in time range)  morphine (PF) 4 MG/ML injection 4 mg (4 mg Intravenous Given 09/05/22 0926)  ondansetron (ZOFRAN) injection 4 mg (4 mg Intravenous Given 09/05/22 0920)  sodium chloride 0.9 % bolus 1,000 mL (0 mLs Intravenous Stopped  09/05/22 1121)  alum & mag hydroxide-simeth (MAALOX/MYLANTA) 200-200-20 MG/5ML suspension 30 mL (30 mLs Oral Given 09/05/22 0920)  iohexol (OMNIPAQUE) 300 MG/ML solution 100 mL (100 mLs Intravenous Contrast Given 09/05/22 1035)  morphine (PF) 4 MG/ML injection 4 mg (4 mg Intravenous Given 09/05/22 1212)  ondansetron (ZOFRAN) injection 4 mg (4 mg Intravenous Given 09/05/22 1213)  cefTRIAXone (ROCEPHIN) 2 g in sodium chloride 0.9 % 100 mL IVPB (0 g Intravenous Stopped 09/05/22 1506)    ED Course/ Medical Decision Making/ A&P                             Medical Decision Making Amount and/or Complexity of Data Reviewed Labs: ordered. Radiology: ordered.  Risk OTC drugs. Prescription drug management.   37 yo F with a chief complaints of upper abdominal discomfort.  Been going on since yesterday.  She is worried about her gallbladder because her mom just had hers out.  No fevers no vomiting.  No suspicious food intake.  She is on Ozempic.  LFTs lipase unremarkable.  No leukocytosis.  UA negative for infection.  She was reassessed and was still very uncomfortable.  CT imaging was performed.  She has a fairly large stone in the gallbladder neck.  No other obvious signs of acute cholecystitis.  Patient still quite symptomatic on repeat assessment.  Will obtain a ultrasound.  Redosed pain medicines.  Ultrasound is concerning for acute cholecystitis.  I discussed this with Dr. Carolynne Edouard, general surgery recommended ED to ED transfer.  Discussed with Dr. Durwin Nora accepts in ED to ED transfer.  The patients results and plan were reviewed and discussed.   Any x-rays performed were independently reviewed by myself.   Differential diagnosis were considered with the presenting HPI.  Medications  morphine (PF) 4 MG/ML injection 4 mg (has no administration in time range)  ondansetron (ZOFRAN) injection 4 mg (has no administration in time range)  morphine (PF) 4 MG/ML injection 4 mg (4 mg Intravenous Given  09/05/22 0926)  ondansetron (ZOFRAN) injection 4 mg (4 mg Intravenous Given 09/05/22 0920)  sodium chloride 0.9 % bolus 1,000 mL (0 mLs Intravenous Stopped 09/05/22 1121)  alum & mag hydroxide-simeth (MAALOX/MYLANTA) 200-200-20 MG/5ML suspension 30 mL (30 mLs Oral Given 09/05/22 0920)  iohexol (OMNIPAQUE) 300 MG/ML solution 100 mL (100 mLs Intravenous Contrast Given 09/05/22 1035)  morphine (PF) 4 MG/ML injection 4 mg (4 mg Intravenous Given 09/05/22 1212)  ondansetron (ZOFRAN) injection 4 mg (4 mg Intravenous Given 09/05/22 1213)  cefTRIAXone (ROCEPHIN) 2 g in sodium chloride 0.9 % 100  mL IVPB (0 g Intravenous Stopped 09/05/22 1506)    Vitals:   09/05/22 0840 09/05/22 0841 09/05/22 1121 09/05/22 1341  BP:  (!) 183/96 (!) 179/102   Pulse:  71 61   Resp:  15 16   Temp:  99.1 F (37.3 C)  98.5 F (36.9 C)  TempSrc:  Oral  Oral  SpO2:  98% 94%   Weight: (!) 142 kg     Height: 5\' 7"  (1.702 m)       Final diagnoses:  Acute cholecystitis    Admission/ observation were discussed with the admitting physician, patient and/or family and they are comfortable with the plan.            Final Clinical Impression(s) / ED Diagnoses Final diagnoses:  Acute cholecystitis    Rx / DC Orders ED Discharge Orders     None         Melene Plan, DO 09/05/22 1507

## 2022-09-05 NOTE — ED Notes (Signed)
ED TO INPATIENT HANDOFF REPORT  ED Nurse Name and Phone #: Jesse Sans, [phone]  S Name/Age/Gender Katrina Carlson 37 y.o. female Room/Bed: H021C/H021C  Code Status   Code Status: Full Code  Home/SNF/Other Home Patient oriented to: self, place, time, and situation Is this baseline? Yes   Triage Complete: Triage complete  Chief Complaint Gallstones [K80.20]  Triage Note Pt arrives to ED with c/o abdominal pain x2 days. Associated with mild constipation and nausea.    Allergies No Known Allergies  Level of Care/Admitting Diagnosis ED Disposition     ED Disposition  Admit   Condition  --   Comment  Hospital Area: [facility] [[id number]]  Level of Care: Med-Surg [16]  May admit patient to [facility] or [facility] if equivalent level of care is available:: No  Covid Evaluation: Asymptomatic - no recent exposure (last 10 days) testing not required  Diagnosis: Gallstones [454098]  Admitting Physician: Griselda Miner [phone]  Attending Physician: Griselda Miner [phone]  Certification:: I certify this patient will need inpatient services for at least 2 midnights  Estimated Length of Stay: 2          B Medical/Surgery History Past Medical History:  Diagnosis Date   Obesity    Prediabetes    Past Surgical History:  Procedure Laterality Date   FRACTURE SURGERY     WRIST SURGERY       A IV Location/Drains/Wounds Patient Lines/Drains/Airways Status     Active Line/Drains/Airways     Name Placement date Placement time Site Days   Peripheral IV 09/05/22 20 G 1" Anterior;Proximal;Right Forearm 09/05/22  0851  Forearm  less than 1            Intake/Output Last 24 hours  Intake/Output Summary (Last 24 hours) at 09/05/2022 1718 Last data filed at 09/05/2022 1506 Gross per 24 hour  Intake 1096.33 ml  Output --  Net 1096.33 ml    Labs/Imaging Results for orders placed or performed during the hospital encounter of 09/05/22 (from the past  48 hour(s))  Lipase, blood     Status: None   Collection Time: 09/05/22  8:48 AM  Result Value Ref Range   Lipase 29 11 - 51 U/L    Comment: Performed at [facility], [address], [place], [place] [address]  Comprehensive metabolic panel     Status: Abnormal   Collection Time: 09/05/22  8:48 AM  Result Value Ref Range   Sodium 135 135 - 145 mmol/L   Potassium 3.6 3.5 - 5.1 mmol/L   Chloride 103 98 - 111 mmol/L   CO2 25 22 - 32 mmol/L   Glucose, Bld 122 (H) 70 - 99 mg/dL    Comment: Glucose reference range applies only to samples taken after fasting for at least 8 hours.   BUN 8 6 - 20 mg/dL   Creatinine, Ser 3.08 0.44 - 1.00 mg/dL   Calcium 9.1 8.9 - 65.7 mg/dL   Total Protein 7.8 6.5 - 8.1 g/dL   Albumin 3.8 3.5 - 5.0 g/dL   AST 14 (L) 15 - 41 U/L   ALT 12 0 - 44 U/L   Alkaline Phosphatase 48 38 - 126 U/L   Total Bilirubin 0.5 0.3 - 1.2 mg/dL   GFR, Estimated >84 >69 mL/min    Comment: (NOTE) Calculated using the CKD-EPI Creatinine Equation (2021)    Anion gap 7 5 - 15    Comment: Performed at [facility],  [address], [place], [place] [address]  CBC     Status: None   Collection Time: 09/05/22  8:48 AM  Result Value Ref Range   WBC 9.2 4.0 - 10.5 K/uL   RBC 4.76 3.87 - 5.11 MIL/uL   Hemoglobin 14.6 12.0 - 15.0 g/dL   HCT 38.7 56.4 - 33.2 %   MCV 91.6 80.0 - 100.0 fL   MCH 30.7 26.0 - 34.0 pg   MCHC 33.5 30.0 - 36.0 g/dL   RDW 95.1 88.4 - 16.6 %   Platelets 323 150 - 400 K/uL   nRBC 0.0 0.0 - 0.2 %    Comment: Performed at [facility], [address] [place], [place] [address]  hCG, serum, qualitative     Status: None   Collection Time: 09/05/22  8:48 AM  Result Value Ref Range   Preg, Serum NEGATIVE NEGATIVE    Comment:        THE SENSITIVITY OF THIS METHODOLOGY IS >10 mIU/mL. Performed at [facility], [address], [place], [place] [address]   Urinalysis, Routine w  reflex microscopic -Urine, Clean Catch     Status: Abnormal   Collection Time: 09/05/22 11:21 AM  Result Value Ref Range   Color, Urine COLORLESS (A) YELLOW   APPearance CLEAR CLEAR   Specific Gravity, Urine 1.043 (H) 1.005 - 1.030   pH 6.5 5.0 - 8.0   Glucose, UA NEGATIVE NEGATIVE mg/dL   Hgb urine dipstick NEGATIVE NEGATIVE   Bilirubin Urine NEGATIVE NEGATIVE   Ketones, ur NEGATIVE NEGATIVE mg/dL   Protein, ur NEGATIVE NEGATIVE mg/dL   Nitrite NEGATIVE NEGATIVE   Leukocytes,Ua NEGATIVE NEGATIVE    Comment: Performed at [facility], [address] [place], [place] [address]   US Abdomen Limited RUQ (LIVER/GB)  Result Date: 09/05/2022 CLINICAL DATA:  Right upper quadrant abdominal pain. EXAM: ULTRASOUND ABDOMEN LIMITED RIGHT UPPER QUADRANT COMPARISON:  Abdominal CT same date. FINDINGS: Gallbladder: As seen on CT, there is a large peripherally calcified gallstone which measures up to 4.7 cm. There is sludge within the gallbladder lumen with gallbladder wall thickening and a positive sonographic Murphy sign. Common bile duct: Diameter: 7 mm. No evidence of choledocholithiasis or intrahepatic biliary dilatation. Liver: Mildly increased hepatic echogenicity, likely due to steatosis. No focal abnormality identified. Portal vein is patent on color Doppler imaging with normal direction of blood flow towards the liver. Other: None. IMPRESSION: 1. Cholelithiasis with gallbladder wall thickening and positive sonographic Murphy sign concerning for acute cholecystitis. 2. Mild dilatation of the common bile duct without evidence of choledocholithiasis. 3. Mild hepatic steatosis. Electronically Signed   By: Carey Bullocks M.D.   On: 09/05/2022 13:45   CT ABDOMEN PELVIS W CONTRAST  Result Date: 09/05/2022 CLINICAL DATA:  Abdominal pain x2 days EXAM: CT ABDOMEN AND PELVIS WITH CONTRAST TECHNIQUE: Multidetector CT imaging of the abdomen and pelvis was performed using the standard protocol  following bolus administration of intravenous contrast. RADIATION DOSE REDUCTION: This exam was performed according to the departmental dose-optimization program which includes automated exposure control, adjustment of the mA and/or kV according to patient size and/or use of iterative reconstruction technique. CONTRAST:  OMNIPAQUE IOHEXOL 300 MG/ML  SOLN COMPARISON:  None Available. FINDINGS: Lower chest: Breathing motion limits evaluation of lower lung fields. Small linear densities are seen in lingula suggesting subsegmental atelectasis. There is no focal consolidation. Hepatobiliary: No focal abnormalities are seen in liver. There is no dilation of bile ducts. There is 2.5 cm  ring-like calcified gallstone in the neck of the gallbladder. There is layering high density in the fundus of the gallbladder suggesting tiny gallbladder stones and sludge. There is no wall thickening in gallbladder. Pancreas: No focal abnormalities are seen. Spleen: Unremarkable. Adrenals/Urinary Tract: Adrenals are not enlarged. There is 9 mm fatty nodule in right adrenal, possibly adrenal myelolipoma. There is no hydronephrosis. There are no renal or ureteral stones. Urinary bladder is not distended. Stomach/Bowel: Stomach is unremarkable. Small bowel loops are not dilated. Appendix is not dilated. There is no significant wall thickening in colon. There is no pericolic stranding. Vascular/Lymphatic: Unremarkable. Reproductive: Unremarkable. Other: There is no ascites or pneumoperitoneum. Musculoskeletal: Degenerative changes are noted at the L5-S1 level with encroachment of neural foramina. IMPRESSION: There is no evidence of intestinal obstruction or pneumoperitoneum. Appendix is not dilated. There is no hydronephrosis. There is large gallbladder stone in the neck of the gallbladder. Sludge and tiny stones are seen in the lumen of the fundus of the gallbladder. There is no dilation of bile ducts. There is 9 mm fatty nodule in right  adrenal suggesting adrenal myelolipoma. Lumbar spondylosis. Electronically Signed   By: Ernie Avena M.D.   On: 09/05/2022 11:37    Pending Labs [employer] (From admission, onward)     Start     Ordered   Signed and Held  HIV Antibody (routine testing w rflx)  (HIV Antibody (Routine testing w reflex) panel)  Once,   R        Signed and Held   Signed and Held  Comprehensive metabolic panel  Tomorrow morning,   R        Signed and Held   Signed and Held  CBC  Tomorrow morning,   R        Signed and Held            Vitals/Pain Today's Vitals   09/05/22 1505 09/05/22 1508 09/05/22 1642 09/05/22 1645  BP: (!) 175/93  (!) 170/102   Pulse: 65  74   Resp: 18  17   Temp: 98.7 F (37.1 C)   98.2 F (36.8 C)  TempSrc: Oral   Oral  SpO2: 96%  93%   Weight:      Height:      PainSc:  8       Isolation Precautions No active isolations  Medications Medications  ondansetron (ZOFRAN) injection 4 mg (has no administration in time range)  morphine (PF) 4 MG/ML injection 4 mg (4 mg Intravenous Given 09/05/22 0926)  ondansetron (ZOFRAN) injection 4 mg (4 mg Intravenous Given 09/05/22 0920)  sodium chloride 0.9 % bolus 1,000 mL (0 mLs Intravenous Stopped 09/05/22 1121)  alum & mag hydroxide-simeth (MAALOX/MYLANTA) 200-200-20 MG/5ML suspension 30 mL (30 mLs Oral Given 09/05/22 0920)  iohexol (OMNIPAQUE) 300 MG/ML solution 100 mL (100 mLs Intravenous Contrast Given 09/05/22 1035)  morphine (PF) 4 MG/ML injection 4 mg (4 mg Intravenous Given 09/05/22 1212)  ondansetron (ZOFRAN) injection 4 mg (4 mg Intravenous Given 09/05/22 1213)  cefTRIAXone (ROCEPHIN) 2 g in sodium chloride 0.9 % 100 mL IVPB (0 g Intravenous Stopped 09/05/22 1506)  morphine (PF) 4 MG/ML injection 4 mg (4 mg Intravenous Given 09/05/22 1508)  ondansetron (ZOFRAN) injection 4 mg (4 mg Intravenous Given 09/05/22 1507)  HYDROmorphone (DILAUDID) injection 1 mg (1 mg Intravenous Given 09/05/22 1705)    Mobility walks      Focused Assessments Neuro Assessment Handoff:  Swallow screen pass? Yes  Neuro Assessment:   Neuro Checks:      Has TPA been given? No If patient is a Neuro Trauma and patient is going to OR before floor call report to 4N Charge nurse: [phone] or [phone]   R Recommendations: See Admitting Provider Note  Report given to:   Additional Notes: pt is AAOx4. Pt is on room air.

## 2022-09-05 NOTE — ED Triage Notes (Signed)
Pt arrives to ED with c/o abdominal pain x2 days. Associated with mild constipation and nausea.

## 2022-09-05 NOTE — Plan of Care (Signed)

## 2022-09-06 LAB — COMPREHENSIVE METABOLIC PANEL
ALT: 17 U/L (ref 0–44)
AST: 16 U/L (ref 15–41)
Albumin: 3.1 g/dL — ABNORMAL LOW (ref 3.5–5.0)
Alkaline Phosphatase: 49 U/L (ref 38–126)
Anion gap: 6 (ref 5–15)
BUN: <5 mg/dL — ABNORMAL LOW (ref 6–20)
CO2: 25 mmol/L (ref 22–32)
Calcium: 8.1 mg/dL — ABNORMAL LOW (ref 8.9–10.3)
Chloride: 104 mmol/L (ref 98–111)
Creatinine, Ser: 0.75 mg/dL (ref 0.44–1.00)
GFR, Estimated: >60 mL/min (ref >60)
Glucose, Bld: 100 mg/dL — ABNORMAL HIGH (ref 70–99)
Potassium: 3.5 mmol/L (ref 3.5–5.1)
Sodium: 135 mmol/L (ref 135–145)
Total Bilirubin: 1.1 mg/dL (ref 0.3–1.2)
Total Protein: 7.5 g/dL (ref 6.5–8.1)

## 2022-09-06 LAB — CBC
HCT: 43.2 % (ref 36.0–46.0)
Hemoglobin: 14.1 g/dL (ref 12.0–15.0)
MCH: 30.1 pg (ref 26.0–34.0)
MCHC: 32.6 g/dL (ref 30.0–36.0)
MCV: 92.3 fL (ref 80.0–100.0)
Platelets: 323 10*3/uL (ref 150–400)
RBC: 4.68 MIL/uL (ref 3.87–5.11)
RDW: 12.9 % (ref 11.5–15.5)
WBC: 10 10*3/uL (ref 4.0–10.5)
nRBC: 0 % (ref 0.0–0.2)

## 2022-09-06 MED ORDER — HYDROMORPHONE HCL 1 MG/ML IJ SOLN
1.0000 mg | INTRAMUSCULAR | Status: DC | PRN
  Administered 2022-09-06 – 2022-09-07 (×8): 1 mg via INTRAVENOUS
  Filled 2022-09-06 (×8): qty 1

## 2022-09-06 MED ORDER — ACETAMINOPHEN 325 MG PO TABS
650.0000 mg | ORAL_TABLET | Freq: Four times a day (QID) | ORAL | Status: DC
  Administered 2022-09-06 – 2022-09-08 (×9): 650 mg via ORAL
  Filled 2022-09-06 (×9): qty 2

## 2022-09-06 MED ORDER — OXYCODONE HCL 5 MG PO TABS
5.0000 mg | ORAL_TABLET | ORAL | Status: DC | PRN
  Filled 2022-09-06: qty 1

## 2022-09-06 MED ORDER — PROMETHAZINE HCL 25 MG PO TABS
12.5000 mg | ORAL_TABLET | Freq: Four times a day (QID) | ORAL | Status: DC | PRN
  Administered 2022-09-06 (×2): 12.5 mg via ORAL
  Filled 2022-09-06 (×2): qty 1

## 2022-09-06 MED ORDER — BISACODYL 5 MG PO TBEC
5.0000 mg | DELAYED_RELEASE_TABLET | Freq: Every day | ORAL | Status: DC | PRN
  Administered 2022-09-06 – 2022-09-07 (×2): 5 mg via ORAL
  Filled 2022-09-06 (×2): qty 1

## 2022-09-06 NOTE — TOC CM/SW Note (Signed)
Transition of Care [facility]) - Inpatient Brief Assessment   Patient Details  Name: OLUWADAMILOLA DELIZ MRN: [id number] Date of Birth: January 04, 1986  Transition of Care [facility] CM/SW Contact:    Harriet Masson, RN Phone Number: 09/06/2022, 10:09 AM   Clinical Narrative: Patient needs lab chole this admission. Awaiting surgery schedule.  TOC following.   Transition of Care Asessment: Insurance and Status: Insurance coverage has been reviewed Patient has primary care physician: Yes Home environment has been reviewed: safe to discharge home Prior level of function:: independent Prior/Current Home Services: No current home services Social Determinants of Health Reivew: SDOH reviewed no interventions necessary Readmission risk has been reviewed: Yes Transition of care needs: no transition of care needs at this time

## 2022-09-06 NOTE — H&P (View-Only) (Signed)
Subjective/Chief Complaint: Continued pain/nausea when meds wear off   Objective: Vital signs in last 24 hours: Temp:  [97.7 F (36.5 C)-99.1 F (37.3 C)] 98.4 F (36.9 C) (07/17 0422) Pulse Rate:  [61-74] 68 (07/17 0422) Resp:  [15-18] 18 (07/17 0422) BP: (144-183)/(84-102) 171/90 (07/17 0422) SpO2:  [92 %-98 %] 92 % (07/17 0422) Weight:  [142 kg] 142 kg (07/16 0840) Last BM Date : 09/05/22  Intake/Output from previous day: 07/16 0701 - 07/17 0700 In: 1096.3 [IV Piggyback:1096.3] Out: -  Intake/Output this shift: No intake/output data recorded.  A&Ox3 Unlabored respirations Abdomen obese, TTP RUQ  Lab Results:  Recent Labs    09/05/22 0848 09/06/22 0118  WBC 9.2 10.0  HGB 14.6 14.1  HCT 43.6 43.2  PLT 323 323   BMET Recent Labs    09/05/22 0848 09/06/22 0118  NA 135 135  K 3.6 3.5  CL 103 104  CO2 25 25  GLUCOSE 122* 100*  BUN 8 <5*  CREATININE 0.72 0.75  CALCIUM 9.1 8.1*   PT/INR No results for input(s): "LABPROT", "INR" in the last 72 hours. ABG No results for input(s): "PHART", "HCO3" in the last 72 hours.  Invalid input(s): "PCO2", "PO2"  Studies/Results: US Abdomen Limited RUQ (LIVER/GB)  Result Date: 09/05/2022 CLINICAL DATA:  Right upper quadrant abdominal pain. EXAM: ULTRASOUND ABDOMEN LIMITED RIGHT UPPER QUADRANT COMPARISON:  Abdominal CT same date. FINDINGS: Gallbladder: As seen on CT, there is a large peripherally calcified gallstone which measures up to 4.7 cm. There is sludge within the gallbladder lumen with gallbladder wall thickening and a positive sonographic Murphy sign. Common bile duct: Diameter: 7 mm. No evidence of choledocholithiasis or intrahepatic biliary dilatation. Liver: Mildly increased hepatic echogenicity, likely due to steatosis. No focal abnormality identified. Portal vein is patent on color Doppler imaging with normal direction of blood flow towards the liver. Other: None. IMPRESSION: 1. Cholelithiasis with  gallbladder wall thickening and positive sonographic Murphy sign concerning for acute cholecystitis. 2. Mild dilatation of the common bile duct without evidence of choledocholithiasis. 3. Mild hepatic steatosis. Electronically Signed   By: Carey Bullocks M.D.   On: 09/05/2022 13:45   CT ABDOMEN PELVIS W CONTRAST  Result Date: 09/05/2022 CLINICAL DATA:  Abdominal pain x2 days EXAM: CT ABDOMEN AND PELVIS WITH CONTRAST TECHNIQUE: Multidetector CT imaging of the abdomen and pelvis was performed using the standard protocol following bolus administration of intravenous contrast. RADIATION DOSE REDUCTION: This exam was performed according to the departmental dose-optimization program which includes automated exposure control, adjustment of the mA and/or kV according to patient size and/or use of iterative reconstruction technique. CONTRAST:  OMNIPAQUE IOHEXOL 300 MG/ML  SOLN COMPARISON:  None Available. FINDINGS: Lower chest: Breathing motion limits evaluation of lower lung fields. Small linear densities are seen in lingula suggesting subsegmental atelectasis. There is no focal consolidation. Hepatobiliary: No focal abnormalities are seen in liver. There is no dilation of bile ducts. There is 2.5 cm ring-like calcified gallstone in the neck of the gallbladder. There is layering high density in the fundus of the gallbladder suggesting tiny gallbladder stones and sludge. There is no wall thickening in gallbladder. Pancreas: No focal abnormalities are seen. Spleen: Unremarkable. Adrenals/Urinary Tract: Adrenals are not enlarged. There is 9 mm fatty nodule in right adrenal, possibly adrenal myelolipoma. There is no hydronephrosis. There are no renal or ureteral stones. Urinary bladder is not distended. Stomach/Bowel: Stomach is unremarkable. Small bowel loops are not dilated. Appendix is not dilated. There is no  significant wall thickening in colon. There is no pericolic stranding. Vascular/Lymphatic: Unremarkable.  Reproductive: Unremarkable. Other: There is no ascites or pneumoperitoneum. Musculoskeletal: Degenerative changes are noted at the L5-S1 level with encroachment of neural foramina. IMPRESSION: There is no evidence of intestinal obstruction or pneumoperitoneum. Appendix is not dilated. There is no hydronephrosis. There is large gallbladder stone in the neck of the gallbladder. Sludge and tiny stones are seen in the lumen of the fundus of the gallbladder. There is no dilation of bile ducts. 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 Lumbar spondylosis. Electronically Signed   By: Ernie Avena M.D.   On: 09/05/2022 11:37    Anti-infectives: Anti-infectives (From admission, onward)    Start     Dose/Rate Route Frequency Ordered Stop   09/06/22 1400  cefTRIAXone (ROCEPHIN) 2 g in sodium chloride 0.9 % 100 mL IVPB        2 g 200 mL/hr over 30 Minutes Intravenous Every 24 hours 09/05/22 1812 09/13/22 1359   09/05/22 1415  cefTRIAXone (ROCEPHIN) 2 g in sodium chloride 0.9 % 100 mL IVPB        2 g 200 mL/hr over 30 Minutes Intravenous  Once 09/05/22 1401 09/05/22 1506       Assessment/Plan:  Cholecystitis. Continue abx, pain/nausea control, NPO for now until today's OR schedule is finalized. Can have clear liquids until midnight if no surgery today.    LOS: 1 day    Berna Bue 09/06/2022  Mdm- mod

## 2022-09-06 NOTE — Progress Notes (Signed)
Subjective/Chief Complaint: Continued pain/nausea when meds wear off   Objective: Vital signs in last 24 hours: Temp:  [97.7 F (36.5 C)-99.1 F (37.3 C)] 98.4 F (36.9 C) (07/17 0422) Pulse Rate:  [61-74] 68 (07/17 0422) Resp:  [15-18] 18 (07/17 0422) BP: (144-183)/(84-102) 171/90 (07/17 0422) SpO2:  [92 %-98 %] 92 % (07/17 0422) Weight:  [142 kg] 142 kg (07/16 0840) Last BM Date : 09/05/22  Intake/Output from previous day: 07/16 0701 - 07/17 0700 In: 1096.3 [IV Piggyback:1096.3] Out: -  Intake/Output this shift: No intake/output data recorded.  A&Ox3 Unlabored respirations Abdomen obese, TTP RUQ  Lab Results:  Recent Labs    09/05/22 0848 09/06/22 0118  WBC 9.2 10.0  HGB 14.6 14.1  HCT 43.6 43.2  PLT 323 323   BMET Recent Labs    09/05/22 0848 09/06/22 0118  NA 135 135  K 3.6 3.5  CL 103 104  CO2 25 25  GLUCOSE 122* 100*  BUN 8 <5*  CREATININE 0.72 0.75  CALCIUM 9.1 8.1*   PT/INR No results for input(s): "LABPROT", "INR" in the last 72 hours. ABG No results for input(s): "PHART", "HCO3" in the last 72 hours.  Invalid input(s): "PCO2", "PO2"  Studies/Results: US Abdomen Limited RUQ (LIVER/GB)  Result Date: 09/05/2022 CLINICAL DATA:  Right upper quadrant abdominal pain. EXAM: ULTRASOUND ABDOMEN LIMITED RIGHT UPPER QUADRANT COMPARISON:  Abdominal CT same date. FINDINGS: Gallbladder: As seen on CT, there is a large peripherally calcified gallstone which measures up to 4.7 cm. There is sludge within the gallbladder lumen with gallbladder wall thickening and a positive sonographic Murphy sign. Common bile duct: Diameter: 7 mm. No evidence of choledocholithiasis or intrahepatic biliary dilatation. Liver: Mildly increased hepatic echogenicity, likely due to steatosis. No focal abnormality identified. Portal vein is patent on color Doppler imaging with normal direction of blood flow towards the liver. Other: None. IMPRESSION: 1. Cholelithiasis with  gallbladder wall thickening and positive sonographic Murphy sign concerning for acute cholecystitis. 2. Mild dilatation of the common bile duct without evidence of choledocholithiasis. 3. Mild hepatic steatosis. Electronically Signed   By: Carey Bullocks M.D.   On: 09/05/2022 13:45   CT ABDOMEN PELVIS W CONTRAST  Result Date: 09/05/2022 CLINICAL DATA:  Abdominal pain x2 days EXAM: CT ABDOMEN AND PELVIS WITH CONTRAST TECHNIQUE: Multidetector CT imaging of the abdomen and pelvis was performed using the standard protocol following bolus administration of intravenous contrast. RADIATION DOSE REDUCTION: This exam was performed according to the departmental dose-optimization program which includes automated exposure control, adjustment of the mA and/or kV according to patient size and/or use of iterative reconstruction technique. CONTRAST:  OMNIPAQUE IOHEXOL 300 MG/ML  SOLN COMPARISON:  None Available. FINDINGS: Lower chest: Breathing motion limits evaluation of lower lung fields. Small linear densities are seen in lingula suggesting subsegmental atelectasis. There is no focal consolidation. Hepatobiliary: No focal abnormalities are seen in liver. There is no dilation of bile ducts. There is 2.5 cm ring-like calcified gallstone in the neck of the gallbladder. There is layering high density in the fundus of the gallbladder suggesting tiny gallbladder stones and sludge. There is no wall thickening in gallbladder. Pancreas: No focal abnormalities are seen. Spleen: Unremarkable. Adrenals/Urinary Tract: Adrenals are not enlarged. There is 9 mm fatty nodule in right adrenal, possibly adrenal myelolipoma. There is no hydronephrosis. There are no renal or ureteral stones. Urinary bladder is not distended. Stomach/Bowel: Stomach is unremarkable. Small bowel loops are not dilated. Appendix is not dilated. There is no  significant wall thickening in colon. There is no pericolic stranding. Vascular/Lymphatic: Unremarkable.  Reproductive: Unremarkable. Other: There is no ascites or pneumoperitoneum. Musculoskeletal: Degenerative changes are noted at the L5-S1 level with encroachment of neural foramina. IMPRESSION: There is no evidence of intestinal obstruction or pneumoperitoneum. Appendix is not dilated. There is no hydronephrosis. There is large gallbladder stone in the neck of the gallbladder. Sludge and tiny stones are seen in the lumen of the fundus of the gallbladder. There is no dilation of bile ducts. 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 Lumbar spondylosis. Electronically Signed   By: Ernie Avena M.D.   On: 09/05/2022 11:37    Anti-infectives: Anti-infectives (From admission, onward)    Start     Dose/Rate Route Frequency Ordered Stop   09/06/22 1400  cefTRIAXone (ROCEPHIN) 2 g in sodium chloride 0.9 % 100 mL IVPB        2 g 200 mL/hr over 30 Minutes Intravenous Every 24 hours 09/05/22 1812 09/13/22 1359   09/05/22 1415  cefTRIAXone (ROCEPHIN) 2 g in sodium chloride 0.9 % 100 mL IVPB        2 g 200 mL/hr over 30 Minutes Intravenous  Once 09/05/22 1401 09/05/22 1506       Assessment/Plan:  Cholecystitis. Continue abx, pain/nausea control, NPO for now until today's OR schedule is finalized. Can have clear liquids until midnight if no surgery today.    LOS: 1 day    Berna Bue 09/06/2022  Mdm- mod

## 2022-09-07 ENCOUNTER — Other Ambulatory Visit: Payer: Self-pay

## 2022-09-07 ENCOUNTER — Inpatient Hospital Stay (HOSPITAL_COMMUNITY): Payer: BC Managed Care – PPO | Admitting: Anesthesiology

## 2022-09-07 ENCOUNTER — Inpatient Hospital Stay (HOSPITAL_COMMUNITY): Payer: BC Managed Care – PPO

## 2022-09-07 ENCOUNTER — Encounter (HOSPITAL_COMMUNITY): Payer: Self-pay | Admitting: General Surgery

## 2022-09-07 ENCOUNTER — Encounter (HOSPITAL_COMMUNITY): Admission: EM | Disposition: A | Discharge status: Home / Self Care | Payer: Self-pay | Source: Home / Self Care

## 2022-09-07 ENCOUNTER — Telehealth (INDEPENDENT_AMBULATORY_CARE_PROVIDER_SITE_OTHER): Payer: Self-pay | Admitting: Nurse Practitioner

## 2022-09-07 HISTORY — PX: CHOLECYSTECTOMY: SHX55

## 2022-09-07 LAB — GLUCOSE, CAPILLARY: Glucose-Capillary: 120 mg/dL — ABNORMAL HIGH (ref 70–99)

## 2022-09-07 SURGERY — LAPAROSCOPIC CHOLECYSTECTOMY
Anesthesia: General

## 2022-09-07 MED ORDER — MIDAZOLAM HCL 2 MG/2ML IJ SOLN
INTRAMUSCULAR | Status: DC | PRN
  Administered 2022-09-07: 2 mg via INTRAVENOUS

## 2022-09-07 MED ORDER — SUGAMMADEX SODIUM 200 MG/2ML IV SOLN
INTRAVENOUS | Status: DC | PRN
  Administered 2022-09-07: 400 mg via INTRAVENOUS

## 2022-09-07 MED ORDER — FENTANYL CITRATE (PF) 100 MCG/2ML IJ SOLN: 25.0000 ug | INTRAMUSCULAR | Status: DC | PRN

## 2022-09-07 MED ORDER — FENTANYL CITRATE (PF) 250 MCG/5ML IJ SOLN
INTRAMUSCULAR | Status: AC
  Filled 2022-09-07: qty 5

## 2022-09-07 MED ORDER — PROPOFOL 10 MG/ML IV BOLUS
INTRAVENOUS | Status: DC | PRN
Start: 2022-09-07 — End: 2022-09-07
  Administered 2022-09-07: 200 mg via INTRAVENOUS

## 2022-09-07 MED ORDER — ONDANSETRON HCL 4 MG/2ML IJ SOLN: 4.0000 mg | Freq: Four times a day (QID) | INTRAMUSCULAR | Status: DC | PRN

## 2022-09-07 MED ORDER — DEXAMETHASONE SODIUM PHOSPHATE 10 MG/ML IJ SOLN
INTRAMUSCULAR | Status: DC | PRN
  Administered 2022-09-07: 5 mg via INTRAVENOUS

## 2022-09-07 MED ORDER — BUPIVACAINE-EPINEPHRINE 0.25% -1:200000 IJ SOLN
INTRAMUSCULAR | Status: DC | PRN
  Administered 2022-09-07: 26 mL

## 2022-09-07 MED ORDER — MIDAZOLAM HCL 2 MG/2ML IJ SOLN
INTRAMUSCULAR | Status: AC
  Filled 2022-09-07: qty 2

## 2022-09-07 MED ORDER — ONDANSETRON HCL 4 MG/2ML IJ SOLN
INTRAMUSCULAR | Status: DC | PRN
  Administered 2022-09-07: 4 mg via INTRAVENOUS

## 2022-09-07 MED ORDER — PROPOFOL 10 MG/ML IV BOLUS
INTRAVENOUS | Status: AC
  Filled 2022-09-07: qty 20

## 2022-09-07 MED ORDER — FENTANYL CITRATE (PF) 250 MCG/5ML IJ SOLN
INTRAMUSCULAR | Status: DC | PRN
  Administered 2022-09-07: 100 ug via INTRAVENOUS

## 2022-09-07 MED ORDER — SODIUM CHLORIDE 0.9 % IR SOLN
Status: DC | PRN
  Administered 2022-09-07: 2000 mL

## 2022-09-07 MED ORDER — BUPIVACAINE-EPINEPHRINE (PF) 0.25% -1:200000 IJ SOLN
INTRAMUSCULAR | Status: AC
  Filled 2022-09-07: qty 30

## 2022-09-07 MED ORDER — OXYCODONE HCL 5 MG/5ML PO SOLN: 5.0000 mg | Freq: Once | ORAL | Status: DC | PRN

## 2022-09-07 MED ORDER — KETOROLAC TROMETHAMINE 30 MG/ML IJ SOLN
INTRAMUSCULAR | Status: DC | PRN
  Administered 2022-09-07: 30 mg via INTRAVENOUS

## 2022-09-07 MED ORDER — ROCURONIUM BROMIDE 10 MG/ML (PF) SYRINGE
PREFILLED_SYRINGE | INTRAVENOUS | Status: DC | PRN
  Administered 2022-09-07: 60 mg via INTRAVENOUS

## 2022-09-07 MED ORDER — 0.9 % SODIUM CHLORIDE (POUR BTL) OPTIME
TOPICAL | Status: DC | PRN
  Administered 2022-09-07: 1000 mL

## 2022-09-07 MED ORDER — LACTATED RINGERS IV SOLN: INTRAVENOUS | Status: DC

## 2022-09-07 MED ORDER — OXYCODONE HCL 5 MG PO TABS: 5.0000 mg | ORAL_TABLET | Freq: Once | ORAL | Status: DC | PRN

## 2022-09-07 MED ORDER — ORAL CARE MOUTH RINSE: 15.0000 mL | Freq: Once | OROMUCOSAL | Status: AC

## 2022-09-07 MED ORDER — LIDOCAINE 2% (20 MG/ML) 5 ML SYRINGE
INTRAMUSCULAR | Status: DC | PRN
  Administered 2022-09-07: 30 mg via INTRAVENOUS

## 2022-09-07 MED ORDER — CHLORHEXIDINE GLUCONATE 0.12 % MT SOLN
15.0000 mL | Freq: Once | OROMUCOSAL | Status: AC
  Administered 2022-09-07: 15 mL via OROMUCOSAL
  Filled 2022-09-07: qty 15

## 2022-09-07 SURGICAL SUPPLY — 43 items
ADH SKN CLS APL DERMABOND .7 (GAUZE/BANDAGES/DRESSINGS) ×1
APL PRP STRL LF DISP 70% ISPRP (MISCELLANEOUS) ×1
APPLIER CLIP 5 13 M/L LIGAMAX5 (MISCELLANEOUS) ×1
APR CLP MED LRG 5 ANG JAW (MISCELLANEOUS) ×1
BAG COUNTER SPONGE SURGICOUNT (BAG) ×1 IMPLANT
BAG SPNG CNTER NS LX DISP (BAG) ×1
BLADE CLIPPER SURG (BLADE) IMPLANT
CANISTER SUCT 3000ML PPV (MISCELLANEOUS) ×1 IMPLANT
CATH REDDICK CHOLANGI 4FR 50CM (CATHETERS) IMPLANT
CHLORAPREP W/TINT 26 (MISCELLANEOUS) ×1 IMPLANT
CLIP APPLIE 5 13 M/L LIGAMAX5 (MISCELLANEOUS) ×1 IMPLANT
COVER MAYO STAND STRL (DRAPES) IMPLANT
COVER SURGICAL LIGHT HANDLE (MISCELLANEOUS) ×1 IMPLANT
DERMABOND ADVANCED .7 DNX12 (GAUZE/BANDAGES/DRESSINGS) ×1 IMPLANT
DRAPE C-ARM 42X72 X-RAY (DRAPES) IMPLANT
ELECT REM PT RETURN 9FT ADLT (ELECTROSURGICAL) ×1
ELECTRODE REM PT RTRN 9FT ADLT (ELECTROSURGICAL) ×1 IMPLANT
GLOVE BIO SURGEON STRL SZ7.5 (GLOVE) ×1 IMPLANT
GOWN STRL REUS W/ TWL LRG LVL3 (GOWN DISPOSABLE) ×3 IMPLANT
GOWN STRL REUS W/TWL LRG LVL3 (GOWN DISPOSABLE) ×3
IRRIG SUCT STRYKERFLOW 2 WTIP (MISCELLANEOUS) ×1
IRRIGATION SUCT STRKRFLW 2 WTP (MISCELLANEOUS) ×1 IMPLANT
KIT BASIN OR (CUSTOM PROCEDURE TRAY) ×1 IMPLANT
KIT IMAGING PINPOINTPAQ (MISCELLANEOUS) IMPLANT
KIT TURNOVER KIT B (KITS) ×1 IMPLANT
NS IRRIG 1000ML POUR BTL (IV SOLUTION) ×1 IMPLANT
PAD ARMBOARD 7.5X6 YLW CONV (MISCELLANEOUS) ×1 IMPLANT
SCISSORS LAP 5X35 DISP (ENDOMECHANICALS) ×1 IMPLANT
SET TUBE SMOKE EVAC HIGH FLOW (TUBING) ×1 IMPLANT
SHEARS HARMONIC ACE PLUS 36CM (ENDOMECHANICALS) IMPLANT
SLEEVE Z-THREAD 5X100MM (TROCAR) ×2 IMPLANT
SPECIMEN JAR SMALL (MISCELLANEOUS) ×1 IMPLANT
SUT MNCRL AB 4-0 PS2 18 (SUTURE) ×1 IMPLANT
SUT VICRYL 0 UR6 27IN ABS (SUTURE) IMPLANT
SYS BAG RETRIEVAL 10MM (BASKET) ×2
SYSTEM BAG RETRIEVAL 10MM (BASKET) ×2 IMPLANT
TOWEL GREEN STERILE (TOWEL DISPOSABLE) ×1 IMPLANT
TOWEL GREEN STERILE FF (TOWEL DISPOSABLE) ×1 IMPLANT
TRAY LAPAROSCOPIC MC (CUSTOM PROCEDURE TRAY) ×1 IMPLANT
TROCAR BALLN 12MMX100 BLUNT (TROCAR) ×1 IMPLANT
TROCAR Z-THREAD OPTICAL 5X100M (TROCAR) ×1 IMPLANT
WARMER LAPAROSCOPE (MISCELLANEOUS) ×1 IMPLANT
WATER STERILE IRR 1000ML POUR (IV SOLUTION) ×1 IMPLANT

## 2022-09-07 NOTE — Anesthesia Preprocedure Evaluation (Signed)
Anesthesia Evaluation  Patient identified by MRN, date of birth, ID band Patient awake    Reviewed: Allergy & Precautions, H&P , NPO status , Patient's Chart, lab work & pertinent test results  Airway Mallampati: II   Neck ROM: full    Dental   Pulmonary neg pulmonary ROS   breath sounds clear to auscultation       Cardiovascular negative cardio ROS  Rhythm:regular Rate:Normal     Neuro/Psych  Neuromuscular disease    GI/Hepatic   Endo/Other  diabetes, Type 2  Morbid obesity  Renal/GU      Musculoskeletal   Abdominal   Peds  Hematology   Anesthesia Other Findings   Reproductive/Obstetrics                             Anesthesia Physical Anesthesia Plan  ASA: 2  Anesthesia Plan: General   Post-op Pain Management:    Induction: Intravenous  PONV Risk Score and Plan: 3 and Ondansetron, Dexamethasone, Midazolam and Treatment may vary due to age or medical condition  Airway Management Planned: Oral ETT  Additional Equipment:   Intra-op Plan:   Post-operative Plan: Extubation in OR  Informed Consent: I have reviewed the patients History and Physical, chart, labs and discussed the procedure including the risks, benefits and alternatives for the proposed anesthesia with the patient or authorized representative who has indicated his/her understanding and acceptance.     Dental advisory given  Plan Discussed with: CRNA, Anesthesiologist and Surgeon  Anesthesia Plan Comments:        Anesthesia Quick Evaluation

## 2022-09-07 NOTE — Telephone Encounter (Signed)
Patient stated she really wants to speak with Tickerhoff about why she cancelled her appointment last Tuesday. Please give her a call today before closing

## 2022-09-07 NOTE — Transfer of Care (Signed)
Immediate Anesthesia Transfer of Care Note  Patient: JAZLEEN ROBECK  Procedure(s) Performed: LAPAROSCOPIC CHOLECYSTECTOMY  Patient Location: PACU  Anesthesia Type:General  Level of Consciousness: awake, alert , and oriented  Airway & Oxygen Therapy: Patient Spontanous Breathing and Patient connected to nasal cannula oxygen  Post-op Assessment: Report given to RN and Post -op Vital signs reviewed and stable  Post vital signs: Reviewed and stable  Last Vitals:  Vitals Value Taken Time  BP 90/59 09/07/22 1121  Temp    Pulse 89 09/07/22 1123  Resp 16 09/07/22 1123  SpO2 100 % 09/07/22 1123  Vitals shown include unfiled device data.  Last Pain:  Vitals:   09/07/22 0839  TempSrc:   PainSc: 3       Patients Stated Pain Goal: 0 (09/05/22 2131)  Complications: No notable events documented.

## 2022-09-07 NOTE — Op Note (Signed)
09/07/2022  11:08 AM  PATIENT:  Katrina Carlson  37 y.o. female  PRE-OPERATIVE DIAGNOSIS:  acute cholecystitis with cholelithiasis  POST-OPERATIVE DIAGNOSIS:  acute cholecystitis with cholelithiasis  PROCEDURE:  Procedure(s): LAPAROSCOPIC CHOLECYSTECTOMY (N/A)  SURGEON:  Surgeons and Role:    * Griselda Miner, MD - Primary  PHYSICIAN ASSISTANT:   ASSISTANTS: none   ANESTHESIA:   local and general  EBL:  minimal   BLOOD ADMINISTERED:none  DRAINS: none   LOCAL MEDICATIONS USED:  MARCAINE     SPECIMEN:  Source of Specimen:  gallbladder  DISPOSITION OF SPECIMEN:  PATHOLOGY  COUNTS:  YES  TOURNIQUET:  * No tourniquets in log *  DICTATION: .Dragon Dictation    Procedure: After informed consent was obtained the patient was brought to the operating room and placed in the supine position on the operating room table. After adequate induction of general anesthesia the patient's abdomen was prepped with ChloraPrep allowed to dry and draped in usual sterile manner. An appropriate timeout was performed. The area below the umbilicus was infiltrated with quarter percent  Marcaine. A small incision was made with a 15 blade knife. The incision was carried down through the subcutaneous tissue bluntly with a hemostat and [employer] retractors. The linea alba was identified. The linea alba was incised with a 15 blade knife and each side was grasped with Coker clamps. The preperitoneal space was then probed with a hemostat until the peritoneum was opened and access was gained to the abdominal cavity. A 0 Vicryl pursestring stitch was placed in the fascia surrounding the opening. A Hassan cannula was then placed through the opening and anchored in place with the previously placed Vicryl purse string stitch. The abdomen was insufflated with carbon dioxide without difficulty. A laparoscope was inserted through the [facility] cannula in the right upper quadrant was inspected. Next the epigastric region  was infiltrated with % Marcaine. A small incision was made with a 15 blade knife. A 5 mm port was placed bluntly through this incision into the abdominal cavity under direct vision. Next 2 sites were chosen laterally on the right side of the abdomen for placement of 5 mm ports. Each of these areas was infiltrated with quarter percent Marcaine. Small stab incisions were made with a 15 blade knife. 5 mm ports were then placed bluntly through these incisions into the abdominal cavity under direct vision without difficulty.  The gallbladder was very tense and inflamed.  It required aspiration in order to be able to grasp it.  A blunt grasper was placed through the lateralmost 5 mm port and used to grasp the dome of the gallbladder and elevate it anteriorly and superiorly. Another blunt grasper was placed through the other 5 mm port and used to retract the body and neck of the gallbladder. A dissector was placed through the epigastric port and using the electrocautery and blunt irrigation dissection the peritoneal reflection at the gallbladder neck was opened. Blunt dissection was then carried out in this area until the gallbladder neck-cystic duct junction was readily identified and a good window was created.  There was too much inflammation to safely get a cholangiogram but we could see the anatomy well.  We stayed up high on the gallbladder neck.  I was able to place 3 clips on the gallbladder neck cystic duct junction and divided the gallbladder neck.  Posterior to this the cystic artery was identified and again dissected bluntly in a circumferential manner until a good window  was created.  2 clips were placed proximally and one distally on the artery and the artery was divided between the 2 sets of clips. Next a laparoscopic hook cautery device was used to separate the gallbladder from the liver bed. Prior to completely detaching the gallbladder from the liver bed the liver bed was inspected and several small  bleeding points were coagulated with the electrocautery until the area was completely hemostatic. The gallbladder was then detached the rest of it from the liver bed without difficulty. A laparoscopic bag was inserted through the hassan port. The laparoscope was moved to the epigastric port. The gallbladder was placed within the bag and the bag was sealed.  The bag with the gallbladder was then removed with the [facility] cannula through the infraumbilical port without difficulty. The fascial defect was then closed with the previously placed Vicryl pursestring stitch as well as with a couple figure-of-eight 0 Vicryl stitches. The liver bed was inspected again and found to be hemostatic. The abdomen was irrigated with copious amounts of saline until the effluent was clear. The ports were then removed under direct vision without difficulty and were found to be hemostatic. The gas was allowed to escape. No other abnormalities were noted on general inspection of the abdomen. The skin incisions were all closed with interrupted 4-0 Monocryl subcuticular stitches. Dermabond dressings were applied. The patient tolerated the procedure well. At the end of the case all needle sponge and instrument counts were correct. The patient was then awakened and taken to recovery in stable condition  PLAN OF CARE: Admit to inpatient   PATIENT DISPOSITION:  PACU - hemodynamically stable.   Delay start of Pharmacological VTE agent (>24hrs) due to surgical blood loss or risk of bleeding: no

## 2022-09-07 NOTE — Discharge Instructions (Addendum)
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 Please follow up with your pcp to see if this needs any additional workup   [facility]  Please arrive at least 30 min before your appointment to complete your check in paperwork.  If you are unable to arrive 30 min prior to your appointment time we may have to cancel or reschedule you. LAPAROSCOPIC SURGERY: POST OP INSTRUCTIONS Always review your discharge instruction sheet given to you by the facility where your surgery was performed. IF YOU HAVE DISABILITY OR FAMILY LEAVE FORMS, YOU MUST BRING THEM TO THE OFFICE FOR PROCESSING.   DO NOT GIVE THEM TO YOUR DOCTOR.  PAIN CONTROL  First take acetaminophen (Tylenol) AND/or ibuprofen (Advil) to control your pain after surgery.  Follow directions on package.  Taking acetaminophen (Tylenol) and/or ibuprofen (Advil) regularly after surgery will help to control your pain and lower the amount of prescription pain medication you may need.  You should not take more than 4,000 mg (4 grams) of acetaminophen (Tylenol) in 24 hours.  You should not take ibuprofen (Advil), aleve, motrin, naprosyn or other NSAIDS if you have a history of stomach ulcers or chronic kidney disease.  A prescription for pain medication may be given to you upon discharge.  Take your pain medication as prescribed, if you still have uncontrolled pain after taking acetaminophen (Tylenol) or ibuprofen (Advil). Use ice packs to help control pain. If you need a refill on your pain medication, please contact your pharmacy.  They will contact our office to request authorization. Prescriptions will not be filled after 5pm or on week-ends.  HOME MEDICATIONS Take your usually prescribed medications unless otherwise directed.  DIET You should follow a light diet the first few days after arrival home.  Be sure to include lots of fluids daily. Avoid fatty, fried foods.    CONSTIPATION It is common to experience some constipation after surgery and if you are taking pain medication.  Increasing fluid intake and taking a stool softener (such as Colace) will usually help or prevent this problem from occurring.  A mild laxative (Milk of Magnesia or Miralax) should be taken according to package instructions if there are no bowel movements after 48 hours.  WOUND/INCISION CARE Most patients will experience some swelling and bruising in the area of the incisions.  Ice packs will help.  Swelling and bruising can take several days to resolve.  Unless discharge instructions indicate otherwise, follow guidelines below  STERI-STRIPS - you may remove your outer bandages 48 hours after surgery, and you may shower at that time.  You have steri-strips (small skin tapes) in place directly over the incision.  These strips should be left on the skin for 7-10 days.   DERMABOND/SKIN GLUE - you may shower in 24 hours.  The glue will flake off over the next 2-3 weeks. Any sutures or staples will be removed at the office during your follow-up visit.  ACTIVITIES You may resume regular (light) daily activities beginning the next day--such as daily self-care, walking, climbing stairs--gradually increasing activities as tolerated.  You may have sexual intercourse when it is comfortable.  Refrain from any heavy lifting or straining until approved by your doctor. You may drive when you are no longer taking prescription pain medication, you can comfortably wear a seatbelt, and you can safely maneuver your car and apply brakes.  FOLLOW-UP You should see your doctor in the office for a follow-up appointment  approximately 2-3 weeks after your surgery.  You should have been given your post-op/follow-up appointment when your surgery was scheduled.  If you did not receive a post-op/follow-up appointment, make sure that you call for this appointment within a day or two after you arrive home to insure a  convenient appointment time.  OTHER INSTRUCTIONS  WHEN TO CALL YOUR DOCTOR: Fever over 101.0 Inability to urinate Continued bleeding from incision. Increased pain, redness, or drainage from the incision. Increasing abdominal pain  The clinic staff is available to answer your questions during regular business hours.  Please don't hesitate to call and ask to speak to one of the nurses for clinical concerns.  If you have a medical emergency, go to the nearest emergency room or call 911.  A surgeon from [facility] is always on call at the hospital. [address], [address], [place], [place]  [address] ? [address] [address], [place], [place]   [address] [phone] ? [phone] ? FAX [phone]

## 2022-09-07 NOTE — Telephone Encounter (Signed)
I returned patients call, she is requesting to speak with you personally. She states that she had to miss her last appointment because of emergency gallbladder surgery.

## 2022-09-07 NOTE — Plan of Care (Signed)

## 2022-09-07 NOTE — Interval H&P Note (Signed)
History and Physical Interval Note:  09/07/2022 8:41 AM  Katrina Carlson  has presented today for surgery, with the diagnosis of acute cholecystitis.  The various methods of treatment have been discussed with the patient and family. After consideration of risks, benefits and other options for treatment, the patient has consented to  Procedure(s): LAPAROSCOPIC CHOLECYSTECTOMY (N/A) as a surgical intervention.  The patient's history has been reviewed, patient examined, no change in status, stable for surgery.  I have reviewed the patient's chart and labs.  Questions were answered to the patient's satisfaction.     Chevis Pretty III

## 2022-09-07 NOTE — Anesthesia Procedure Notes (Signed)
Procedure Name: Intubation Date/Time: 09/07/2022 9:35 AM  Performed by: Gwenyth Allegra, CRNAPre-anesthesia Checklist: Patient identified, Emergency Drugs available, Suction available, Patient being monitored and Timeout performed Patient Re-evaluated:Patient Re-evaluated prior to induction Oxygen Delivery Method: Circle system utilized Preoxygenation: Pre-oxygenation with 100% oxygen Induction Type: IV induction Ventilation: Mask ventilation without difficulty and Oral airway inserted - appropriate to patient size Grade View: Grade II Tube type: Oral Tube size: 7.0 mm Number of attempts: 1 Airway Equipment and Method: Stylet Placement Confirmation: ETT inserted through vocal cords under direct vision, positive ETCO2 and breath sounds checked- equal and bilateral Secured at: 21 cm Tube secured with: Tape Dental Injury: Teeth and Oropharynx as per pre-operative assessment

## 2022-09-08 ENCOUNTER — Encounter (HOSPITAL_COMMUNITY): Payer: Self-pay | Admitting: General Surgery

## 2022-09-08 ENCOUNTER — Other Ambulatory Visit (HOSPITAL_COMMUNITY): Payer: Self-pay

## 2022-09-08 LAB — SURGICAL PATHOLOGY

## 2022-09-08 MED ORDER — OXYCODONE HCL 5 MG PO TABS
5.0000 mg | ORAL_TABLET | Freq: Four times a day (QID) | ORAL | 0 refills | Status: DC | PRN
  Filled 2022-09-08: qty 15, 4d supply, fill #0

## 2022-09-08 MED ORDER — ACETAMINOPHEN 500 MG PO TABS: 1000.0000 mg | ORAL_TABLET | Freq: Three times a day (TID) | ORAL | Status: AC | PRN

## 2022-09-08 NOTE — Plan of Care (Signed)
  Problem: Education: Goal: Knowledge of General Education information will improve Description: Including pain rating scale, medication(s)/side effects and non-pharmacologic comfort measures Outcome: Progressing Pt is aware that she was admitted into the hospital for abdominal pain r/t cholelithiasis.  She is s/p a lap chole and pending d/c home to self care.    Problem: Clinical Measurements: Goal: Will remain free from infection Outcome: Progressing S/Sx of infection monitored and assessed q-shift.  Pt has remained afebrile thus far.  She is on IV abx per MD's orders.   Problem: Activity: Goal: Risk for activity intolerance will decrease Outcome: Progressing Pt is independent of all her ADLs.  She was observed OOB ambulating in her room with a steady gait.   Problem: Nutrition: Goal: Adequate nutrition will be maintained Outcome: Progressing Pt is on a regular diet per MD's orders.  This morning she showed no interest in eating breakfast d/t hospital menu options.  She did request apple juice and graham caracker in which she was able to eat w/o s/sx of n/v or abdominal pain/ distention.   Problem: Elimination: Goal: Will not experience complications related to bowel motility Outcome: Progressing Pt stated her LBM was on 09/04/2022.  She does not endorse c/o constipation thus far.   Problem: Pain Managment: Goal: General experience of comfort will improve Outcome: Progressing Pt has endorsed 3/10 abdominal pain r/t her lap chole describing it as a constant discomfort.  Reiterated pain scale so she could adequately rate her pain.  Pt stated her pain goal would be 0/10.  Discussed nonpharmacological methods to help reduce s/sx of pain.  Interventions given per pt's request and MD's orders.   Problem: Skin Integrity: Goal: Risk for impaired skin integrity will decrease Outcome: Progressing Skin integrity monitored and assessed q-shift.  Instructed pt to turn/ reposition herself q2  hours to prevent further skin impairment.  Tubes and drains assessed for device related pressure sore.  Pt is continent of both bowel and bladder.  Dressing changes performed per MD's orders.

## 2022-09-08 NOTE — Discharge Summary (Signed)
Patient ID: Katrina Carlson [id number] [birth year] 37 y.o.  Admit date: 09/05/2022 Discharge date: 09/08/2022  Discharge Diagnosis Acute Cholecystitis s/p Laparoscopic cholecystectomy   Consultants None  Reason for Admission: The patient is a 37 year old black female who began having upper abdominal pain a couple days ago. The pain has persisted and seem to get a little worse. The pain was associated today with some nausea and vomiting. She came to the emergency department where a CT scan showed a large stone in the gallbladder and some evidence of some mild gallbladder wall thickening. Her liver functions were normal. Her white count was normal. She denies any fevers or chills.   Procedures Dr. Carolynne Edouard - 09/07/22 - Laparoscopic cholecystectomy   Hospital Course:  The patient was admitted and underwent a laparoscopic cholecystectomy with IOC.  The patient tolerated the procedure well.  On POD 1, the patient was tolerating a regular diet, voiding well, mobilizing, and pain was controlled with oral pain medications.  The patient was stable for DC home at this time with appropriate follow up made. Discussed discharge instructions, restrictions and return/call back precautions.   Physical Exam: Gen:  Alert, NAD, pleasant Card:  RRR Pulm:  CTAB, no W/R/R, effort normal Abd: Soft, no distension, appropriately tender around laparoscopic incisions, no rigidity or guarding and otherwise NT, +BS. Incisions with glue intact appears well and are without drainage, bleeding, or signs of infection  Psych: A&Ox3   Allergies as of 09/08/2022   Not on File      Medication List     TAKE these medications    acetaminophen 500 MG tablet Commonly known as: TYLENOL Take 2 tablets (1,000 mg total) by mouth every 8 (eight) hours as needed.   lidocaine 5 % Commonly known as: Lidoderm Place 1 patch onto the skin daily. Remove & Discard patch within 12 hours or as directed by MD   lisinopril 5 MG  tablet Commonly known as: ZESTRIL TAKE 1 TABLET(5 MG) BY MOUTH DAILY   meloxicam 7.5 MG tablet Commonly known as: Mobic Take 1 tablet (7.5 mg total) by mouth daily.   ondansetron 4 MG tablet Commonly known as: Zofran Take 1 tablet (4 mg total) by mouth every 8 (eight) hours as needed for nausea or vomiting.   oxyCODONE 5 MG immediate release tablet Commonly known as: Oxy IR/ROXICODONE Take 1 tablet (5 mg total) by mouth every 6 (six) hours as needed for breakthrough pain.   Semaglutide (1 MG/DOSE) 4 MG/3ML Sopn Inject 1 mg as directed once a week.   Vitamin D (Ergocalciferol) 1.25 MG (50000 UNIT) Caps capsule Commonly known as: DRISDOL Take 1 capsule (50,000 Units total) by mouth every 7 (seven) days.   Vitamin D3 Adult Gummies 25 MCG (1000 UT) Chew Generic drug: Cholecalciferol 5,000 IU qd weekdays, and 10K IU on weekend          Follow-up Information     Jahmire Ruffins, Hedda Slade, PA-C. Go on 09/28/2022.   Specialty: General Surgery Why: Your appointment is 09/28/22 at 1:30 pm Arrive early to check in, fill out paperwork, Bring photo ID and insurance information Contact information: [address] [place] [address] [phone]         Farris Has, MD Follow up.   Specialty: Family Medicine Contact information: [address] [place] [address] [phone]                 Signed: Leary Roca, [facility]  Surgery 09/08/2022, 10:25 AM Please see Amion for pager number during day hours 7:00am-4:30pm

## 2022-09-08 NOTE — TOC Transition Note (Signed)
Transition of Care [facility]) - CM/SW Discharge Note   Patient Details  Name: Katrina Carlson MRN: [id number] Date of Birth: 1985/11/06  [facility] CM/SW Contact:  Harriet Masson, RN Phone Number: 09/08/2022, 1:31 PM   Clinical Narrative:    Patient stable for discharge.  Follow up apts on AVS. No TOC needs at this time.   Final next level of care: Home/Self Care Barriers to Discharge: Barriers Resolved   Patient Goals and CMS Choice     Return home Discharge Placement   home                       Discharge Plan and Services Additional resources added to the After Visit Summary for                                       Social Determinants of Health (SDOH) Interventions SDOH Screenings   Food Insecurity: No Food Insecurity (09/05/2022)  Housing: Low Risk  (09/05/2022)  Transportation Needs: No Transportation Needs (09/05/2022)  Utilities: Not At Risk (09/05/2022)  Depression (PHQ2-9): Medium Risk (04/29/2019)  Tobacco Use: Low Risk  (09/07/2022)     Readmission Risk Interventions    09/08/2022    1:31 PM  Readmission Risk Prevention Plan  Post Dischage Appt Complete  Medication Screening Complete  Transportation Screening Complete

## 2022-09-08 NOTE — Progress Notes (Addendum)
Pt to be discharge home to self care.  Reviewed AVS with pt and her medication list.  Made pt aware of changes to her medications and that she had pending prescriptions to be picked up from the Outpatient Pharmacy.  Answered any pending questions and taught pt on incisional/wound care of her lap chole site.  Pt and her mother had no further question.  Removed PIV which was CDI and free from s/sx of infection.  Assisted pt in getting dressed and gathering her belongings.  Pt escorted downstairs via wheel chair accompanied by Care RN and mother to the front of the visitor's entrance for discharge home.  Pt d/c in stable condition.

## 2022-09-12 NOTE — Anesthesia Postprocedure Evaluation (Signed)
Anesthesia Post Note  Patient: Katrina Carlson  Procedure(s) Performed: LAPAROSCOPIC CHOLECYSTECTOMY     Patient location during evaluation: PACU Anesthesia Type: General Level of consciousness: awake and alert Pain management: pain level controlled Vital Signs Assessment: post-procedure vital signs reviewed and stable Respiratory status: spontaneous breathing, nonlabored ventilation, respiratory function stable and patient connected to nasal cannula oxygen Cardiovascular status: blood pressure returned to baseline and stable Postop Assessment: no apparent nausea or vomiting Anesthetic complications: no   No notable events documented.  Last Vitals:  Vitals:   09/08/22 0526 09/08/22 0835  BP: 119/61 125/63  Pulse: 78 73  Resp: 17 18  Temp:    SpO2: 96% 94%    Last Pain:  Vitals:   09/08/22 1135  TempSrc:   PainSc: 3                  Channelle Bottger S

## 2022-10-02 ENCOUNTER — Ambulatory Visit: Payer: BC Managed Care – PPO | Admitting: Nurse Practitioner

## 2022-10-05 ENCOUNTER — Encounter: Payer: Self-pay | Admitting: Nurse Practitioner

## 2022-10-05 ENCOUNTER — Ambulatory Visit: Payer: BC Managed Care – PPO | Admitting: Nurse Practitioner

## 2022-10-05 VITALS — BP 132/87 | HR 86 | Temp 98.3°F | Ht 67.0 in | Wt 314.0 lb

## 2022-10-05 DIAGNOSIS — Z6841 Body Mass Index (BMI) 40.0 and over, adult: Secondary | ICD-10-CM

## 2022-10-05 DIAGNOSIS — E559 Vitamin D deficiency, unspecified: Secondary | ICD-10-CM | POA: Diagnosis not present

## 2022-10-05 DIAGNOSIS — E119 Type 2 diabetes mellitus without complications: Secondary | ICD-10-CM

## 2022-10-05 DIAGNOSIS — Z7985 Long-term (current) use of injectable non-insulin antidiabetic drugs: Secondary | ICD-10-CM

## 2022-10-05 MED ORDER — VITAMIN D (ERGOCALCIFEROL) 1.25 MG (50000 UNIT) PO CAPS
50000.0000 [IU] | ORAL_CAPSULE | ORAL | 0 refills | Status: DC
Start: 2022-10-05 — End: 2022-10-31

## 2022-10-05 MED ORDER — TIRZEPATIDE 2.5 MG/0.5ML ~~LOC~~ SOAJ: 2.5000 mg | SUBCUTANEOUS | 0 refills | Status: DC

## 2022-10-05 NOTE — Progress Notes (Signed)
Office: [phone]  /  Fax: [phone]  WEIGHT SUMMARY AND BIOMETRICS  Weight Lost Since Last Visit: 1lb  Weight Gained Since Last Visit: 0lb   Vitals Temp: 98.3 F (36.8 C) BP: 132/87 Pulse Rate: 86 SpO2: 100 %   Anthropometric Measurements Height: 5\' 7"  (1.702 m) Weight: (!) 314 lb (142.4 kg) BMI (Calculated): 49.17 Weight at Last Visit: 315lb Weight Lost Since Last Visit: 1lb Weight Gained Since Last Visit: 0lb Starting Weight: 331lb Total Weight Loss (lbs): 17 lb (7.711 kg)   Body Composition  Body Fat %: 52.5 % Fat Mass (lbs): 165.2 lbs Muscle Mass (lbs): 141.8 lbs Total Body Water (lbs): 114.6 lbs Visceral Fat Rating : 17   Other Clinical Data Fasting: No Labs: No Today's Visit #: 28 Starting Date: 04/29/19     HPI  Chief Complaint: OBESITY  Katrina Carlson is here to discuss her progress with her obesity treatment plan. She is on the the Category 4 Plan and states she is following her eating plan approximately 75 % of the time. She states she is exercising 20 minutes 3 days per week.   Interval History:  Since last office visit she has lost 1 pound.  She is status post laparoscopic cholecystectomy on 09/07/22 and is doing well. She followed up with her PCP 09/19/22 for ER follow up and labs.   She stopped her Ozempic the first week of July.  Struggling with hunger and cravings since stopping Ozempic.     Pharmacotherapy for weight loss:  She is not currently taking medications  for medical weight loss.     Previous pharmacotherapy for medical weight loss:  [place]. She wasn't able to increase Wegovy due to [employer]. Now unable to take due to cost   Bariatric surgery:  Patient has not had bariatric surgery  Pharmacotherapy for DMT2:  She is not currently taking any medications for DMT2.  She stopped her Ozempic the fist week in July.    Last A1c was 6.1 She is not checking BS at home.   Episodes of hypoglycemia: no On ACE.   Last eye exam:  2024    Lab Results  Component Value Date   HGBA1C 6.1 (H) 06/27/2022   HGBA1C 6.3 (H) 03/22/2022   HGBA1C 7.3 (H) 12/14/2021   Lab Results  Component Value Date   LDLCALC 87 12/14/2021   CREATININE 0.75 09/06/2022   Vit D deficiency  She is taking Vit D 50,000 IU weekly.  Denies side effects.  Denies nausea, vomiting or muscle weakness.    Lab Results  Component Value Date   VD25OH 6.9 (L) 06/27/2022   VD25OH 7.4 (L) 03/22/2022   VD25OH 8.1 (L) 12/14/2021     PHYSICAL EXAM:  Blood pressure 132/87, pulse 86, temperature 98.3 F (36.8 C), height 5\' 7"  (1.702 m), weight (!) 314 lb (142.4 kg), last menstrual period 08/30/2022, SpO2 100%. Body mass index is 49.18 kg/m.  General: She is overweight, cooperative, alert, well developed, and in no acute distress. PSYCH: Has normal mood, affect and thought process.   Extremities: No edema.  Neurologic: No gross sensory or motor deficits. No tremors or fasciculations noted.    DIAGNOSTIC DATA REVIEWED:  BMET    Component Value Date/Time   NA 135 09/06/2022 0118   NA 136 06/27/2022 1229   K 3.5 09/06/2022 0118   CL 104 09/06/2022 0118   CO2 25 09/06/2022 0118   GLUCOSE 100 (H) 09/06/2022 0118   BUN <5 (L) 09/06/2022  0118   BUN 5 (L) 06/27/2022 1229   CREATININE 0.75 09/06/2022 0118   CALCIUM 8.1 (L) 09/06/2022 0118   GFRNONAA >60 09/06/2022 0118   GFRAA 138 09/23/2019 1202   Lab Results  Component Value Date   HGBA1C 6.1 (H) 06/27/2022   HGBA1C 5.6 09/23/2019   Lab Results  Component Value Date   INSULIN 99.6 (H) 03/22/2022   INSULIN 166.0 (H) 09/23/2019   Lab Results  Component Value Date   TSH 1.700 12/14/2021   CBC    Component Value Date/Time   WBC 10.0 09/06/2022 0118   RBC 4.68 09/06/2022 0118   HGB 14.1 09/06/2022 0118   HGB 14.7 12/14/2021 1619   HCT 43.2 09/06/2022 0118   HCT 43.6 12/14/2021 1619   PLT 323 09/06/2022 0118   PLT 323 12/14/2021 1619   MCV 92.3 09/06/2022 0118    MCV 90 12/14/2021 1619   MCH 30.1 09/06/2022 0118   MCHC 32.6 09/06/2022 0118   RDW 12.9 09/06/2022 0118   RDW 12.3 12/14/2021 1619   Iron Studies    Component Value Date/Time   IRON 54 05/26/2020 1011   TIBC 318 05/26/2020 1011   FERRITIN 77 05/26/2020 1011   IRONPCTSAT 17 05/26/2020 1011   Lipid Panel     Component Value Date/Time   CHOL 144 12/14/2021 1619   TRIG 125 12/14/2021 1619   HDL 34 (L) 12/14/2021 1619   CHOLHDL 4.0 02/01/2021 1146   LDLCALC 87 12/14/2021 1619   Hepatic Function Panel     Component Value Date/Time   PROT 7.5 09/06/2022 0118   PROT 7.6 06/27/2022 1229   ALBUMIN 3.1 (L) 09/06/2022 0118   ALBUMIN 3.9 06/27/2022 1229   AST 16 09/06/2022 0118   ALT 17 09/06/2022 0118   ALKPHOS 49 09/06/2022 0118   BILITOT 1.1 09/06/2022 0118   BILITOT 0.5 06/27/2022 1229      Component Value Date/Time   TSH 1.700 12/14/2021 1619   Nutritional Lab Results  Component Value Date   VD25OH 6.9 (L) 06/27/2022   VD25OH 7.4 (L) 03/22/2022   VD25OH 8.1 (L) 12/14/2021     ASSESSMENT AND PLAN  TREATMENT PLAN FOR OBESITY:  Recommended Dietary Goals  Mekhia is currently in the action stage of change. As such, her goal is to continue weight management plan. She has agreed to the Category 4 Plan.  Behavioral Intervention  We discussed the following Behavioral Modification Strategies today: increasing lean protein intake, decreasing simple carbohydrates , increasing vegetables, increasing lower glycemic fruits, increasing fiber rich foods, avoiding skipping meals, increasing water intake, work on meal planning and preparation, work on tracking and journaling calories using tracking application, reading food labels , continue to practice mindfulness when eating, and planning for success.  Additional resources provided today: NA  Recommended Physical Activity Goals  Gennavieve has been advised to work up to 150 minutes of moderate intensity aerobic activity a  week and strengthening exercises 2-3 times per week for cardiovascular health, weight loss maintenance and preservation of muscle mass.   She has agreed to per surgeon's recommendations    ASSOCIATED CONDITIONS ADDRESSED TODAY  Action/Plan  Type 2 diabetes mellitus without complication, without long-term current use of insulin (HCC) -     Start Tirzepatide; Inject 2.5 mg into the skin once a week.  Dispense: 2 mL; Refill: 0. Side effects discussed. Discussed trizepatide extensively today and all questions answered.  Patient would like to start  Will send recent labs via mychart for me  to review.    Vitamin D deficiency -     continue Vitamin D (Ergocalciferol); Take 1 capsule (50,000 Units total) by mouth every 7 (seven) days.  Dispense: 5 capsule; Refill: 0  Low Vitamin D level contributes to fatigue and are associated with obesity, breast, and colon cancer. She agrees to continue to take prescription Vitamin D @50 ,000 IU every week and will follow-up for routine testing of Vitamin D, at least 2-3 times per year to avoid over-replacement.   Morbid obesity (HCC)  BMI 45.0-49.9, adult (HCC)         Return in about 4 weeks (around 11/02/2022).Marland Kitchen She was informed of the importance of frequent follow up visits to maximize her success with intensive lifestyle modifications for her multiple health conditions.   ATTESTASTION STATEMENTS:  Reviewed by clinician on day of visit: allergies, medications, problem list, medical history, surgical history, family history, social history, and previous encounter notes.     Theodis Sato.  FNP-C

## 2022-10-05 NOTE — Patient Instructions (Signed)

## 2022-10-31 ENCOUNTER — Ambulatory Visit: Payer: BC Managed Care – PPO | Admitting: Nurse Practitioner

## 2022-10-31 ENCOUNTER — Encounter: Payer: Self-pay | Admitting: Nurse Practitioner

## 2022-10-31 VITALS — BP 134/83 | HR 71 | Temp 98.1°F | Ht 67.0 in | Wt 315.0 lb

## 2022-10-31 DIAGNOSIS — Z6841 Body Mass Index (BMI) 40.0 and over, adult: Secondary | ICD-10-CM | POA: Diagnosis not present

## 2022-10-31 DIAGNOSIS — E119 Type 2 diabetes mellitus without complications: Secondary | ICD-10-CM

## 2022-10-31 DIAGNOSIS — E559 Vitamin D deficiency, unspecified: Secondary | ICD-10-CM

## 2022-10-31 DIAGNOSIS — Z7985 Long-term (current) use of injectable non-insulin antidiabetic drugs: Secondary | ICD-10-CM

## 2022-10-31 MED ORDER — VITAMIN D (ERGOCALCIFEROL) 1.25 MG (50000 UNIT) PO CAPS
50000.0000 [IU] | ORAL_CAPSULE | ORAL | 0 refills | Status: DC
Start: 2022-10-31 — End: 2022-12-26

## 2022-10-31 MED ORDER — TIRZEPATIDE 5 MG/0.5ML ~~LOC~~ SOAJ
5.0000 mg | SUBCUTANEOUS | 0 refills | Status: DC
Start: 2022-10-31 — End: 2022-12-26

## 2022-10-31 NOTE — Progress Notes (Addendum)
Office: [phone]  /  Fax: [phone]  WEIGHT SUMMARY AND BIOMETRICS  Weight Lost Since Last Visit: 0  Weight Gained Since Last Visit: 1 lb   Vitals Temp: 98.1 F (36.7 C) BP: 134/83 Pulse Rate: 71 SpO2: 99 %   Anthropometric Measurements Height: 5\' 7"  (1.702 m) Weight: (!) 315 lb (142.9 kg) BMI (Calculated): 49.32 Weight at Last Visit: 314 lb Weight Lost Since Last Visit: 0 Weight Gained Since Last Visit: 1 lb Starting Weight: 331 lb Total Weight Loss (lbs): 16 lb (7.258 kg) Peak Weight: 343 lb   Body Composition  Body Fat %: 53.5 % Fat Mass (lbs): 168.8 lbs Muscle Mass (lbs): 139.4 lbs Visceral Fat Rating : 18   Other Clinical Data Fasting: no Labs: no Today's Visit #: 30 Starting Date: 04/29/19     HPI  Chief Complaint: OBESITY  Katrina Carlson is here to discuss her progress with her obesity treatment plan. She is on the the Category 4 Plan and states she is following her eating plan approximately 70 % of the time. She states she is exercising walking 15 minutes 3 days per week.   Interval History:  Since last office visit she has gained 1 pound.  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  Notes some stress eating, polyphagia and cravings.    Pharmacotherapy for weight loss: She is not currently taking medications  for medical weight loss.    Previous pharmacotherapy for medical weight loss:  [place]. She wasn't able to increase Wegovy due to [employer]. Now unable to take due to cost   Bariatric surgery:  Patient has not had bariatric surgery.   Pharmacotherapy for DMT2:  She is currently taking Mounjaro 2.5mg .  Denies side effects.   Last A1c was 6.1 She is not checking BS at home.   Episodes of hypoglycemia: no On ACE  Last eye exam:  2024 She has tried Ozempic in the past and stopped first week in July    Lab Results  Component Value Date   HGBA1C 6.1 (H) 06/27/2022   HGBA1C 6.3 (H)  03/22/2022   HGBA1C 7.3 (H) 12/14/2021   Lab Results  Component Value Date   LDLCALC 87 12/14/2021   CREATININE 0.75 09/06/2022   Vit D deficiency  She is taking Vit D 50,000 IU weekly.  Denies side effects.  Denies nausea, vomiting or muscle weakness.    Lab Results  Component Value Date   VD25OH 6.9 (L) 06/27/2022   VD25OH 7.4 (L) 03/22/2022   VD25OH 8.1 (L) 12/14/2021     PHYSICAL EXAM:  Blood pressure 134/83, pulse 71, temperature 98.1 F (36.7 C), height 5\' 7"  (1.702 m), weight (!) 315 lb (142.9 kg), SpO2 99%. Body mass index is 49.34 kg/m.  General: She is overweight, cooperative, alert, well developed, and in no acute distress. PSYCH: Has normal mood, affect and thought process.   Extremities: No edema.  Neurologic: No gross sensory or motor deficits. No tremors or fasciculations noted.    DIAGNOSTIC DATA REVIEWED:  BMET    Component Value Date/Time   NA 135 09/06/2022 0118   NA 136 06/27/2022 1229   K 3.5 09/06/2022 0118   CL 104 09/06/2022 0118   CO2 25 09/06/2022 0118   GLUCOSE 100 (H) 09/06/2022 0118   BUN <5 (L) 09/06/2022 0118   BUN 5 (L) 06/27/2022 1229   CREATININE 0.75 09/06/2022 0118   CALCIUM 8.1 (L) 09/06/2022 0118  GFRNONAA >60 09/06/2022 0118   GFRAA 138 09/23/2019 1202   Lab Results  Component Value Date   HGBA1C 6.1 (H) 06/27/2022   HGBA1C 5.6 09/23/2019   Lab Results  Component Value Date   INSULIN 99.6 (H) 03/22/2022   INSULIN 166.0 (H) 09/23/2019   Lab Results  Component Value Date   TSH 1.700 12/14/2021   CBC    Component Value Date/Time   WBC 10.0 09/06/2022 0118   RBC 4.68 09/06/2022 0118   HGB 14.1 09/06/2022 0118   HGB 14.7 12/14/2021 1619   HCT 43.2 09/06/2022 0118   HCT 43.6 12/14/2021 1619   PLT 323 09/06/2022 0118   PLT 323 12/14/2021 1619   MCV 92.3 09/06/2022 0118   MCV 90 12/14/2021 1619   MCH 30.1 09/06/2022 0118   MCHC 32.6 09/06/2022 0118   RDW 12.9 09/06/2022 0118   RDW 12.3 12/14/2021 1619    Iron Studies    Component Value Date/Time   IRON 54 05/26/2020 1011   TIBC 318 05/26/2020 1011   FERRITIN 77 05/26/2020 1011   IRONPCTSAT 17 05/26/2020 1011   Lipid Panel     Component Value Date/Time   CHOL 144 12/14/2021 1619   TRIG 125 12/14/2021 1619   HDL 34 (L) 12/14/2021 1619   CHOLHDL 4.0 02/01/2021 1146   LDLCALC 87 12/14/2021 1619   Hepatic Function Panel     Component Value Date/Time   PROT 7.5 09/06/2022 0118   PROT 7.6 06/27/2022 1229   ALBUMIN 3.1 (L) 09/06/2022 0118   ALBUMIN 3.9 06/27/2022 1229   AST 16 09/06/2022 0118   ALT 17 09/06/2022 0118   ALKPHOS 49 09/06/2022 0118   BILITOT 1.1 09/06/2022 0118   BILITOT 0.5 06/27/2022 1229      Component Value Date/Time   TSH 1.700 12/14/2021 1619   Nutritional Lab Results  Component Value Date   VD25OH 6.9 (L) 06/27/2022   VD25OH 7.4 (L) 03/22/2022   VD25OH 8.1 (L) 12/14/2021     ASSESSMENT AND PLAN  TREATMENT PLAN FOR OBESITY:  Recommended Dietary Goals  Katrina Carlson is currently in the action stage of change. As such, her goal is to continue weight management plan. She has agreed to the Category 4 Plan.  Behavioral Intervention  We discussed the following Behavioral Modification Strategies today: increasing lean protein intake, decreasing simple carbohydrates , increasing vegetables, increasing lower glycemic fruits, increasing water intake, work on meal planning and preparation, reading food labels , continue to practice mindfulness when eating, and planning for success.  Additional resources provided today: NA  Recommended Physical Activity Goals  Katrina Carlson has been advised to work up to 150 minutes of moderate intensity aerobic activity a week and strengthening exercises 2-3 times per week for cardiovascular health, weight loss maintenance and preservation of muscle mass.   She has agreed to Think about ways to increase daily physical activity and overcoming barriers to exercise and Increase  physical activity in their day and reduce sedentary time (increase NEAT).    ASSOCIATED CONDITIONS ADDRESSED TODAY  Action/Plan  Type 2 diabetes mellitus without complication, without long-term current use of insulin (HCC) -     Tirzepatide; Inject 5 mg into the skin once a week.  Dispense: 2 mL; Refill: 0.  Side effects discussed  Good blood sugar control is important to decrease the likelihood of diabetic complications such as nephropathy, neuropathy, limb loss, blindness, coronary artery disease, and death. Intensive lifestyle modification including diet, exercise and weight loss are the first line  of treatment for diabetes.    Vitamin D deficiency -     Vitamin D (Ergocalciferol); Take 1 capsule (50,000 Units total) by mouth every 7 (seven) days.  Dispense: 5 capsule; Refill: 0  Low Vitamin D level contributes to fatigue and are associated with obesity, breast, and colon cancer. She agrees to continue to take prescription Vitamin D @50 ,000 IU every week and will follow-up for routine testing of Vitamin D, at least 2-3 times per year to avoid over-replacement.   Morbid obesity (HCC)  BMI 45.0-49.9, adult Katrina Carlson, Katrina Carlson)  Patient had multiple questions today about future pregnancy.  She is not sure when she wants to try to start getting pregnant.  She plans to make an appointment with her OB/GYN to discuss in more detail concerning what medication she can take or cannot take prior to and during pregnancy.  I did discuss with her that if she does decide to get pregnant she needs to stop her Mounjaro 3 months prior to getting pregnant and to start a PNV.  She plans to discuss with her partner and we will discuss again at next visit   Will obtain labs at next visit   Return in about 4 weeks (around 11/28/2022).Marland Kitchen She was informed of the importance of frequent follow up visits to maximize her success with intensive lifestyle modifications for her multiple health conditions.   ATTESTASTION  STATEMENTS:  Reviewed by clinician on day of visit: allergies, medications, problem list, medical history, surgical history, family history, social history, and previous encounter notes.     Theodis Sato. Severa Jeremiah FNP-C

## 2022-11-01 ENCOUNTER — Encounter (INDEPENDENT_AMBULATORY_CARE_PROVIDER_SITE_OTHER): Payer: Self-pay

## 2022-11-01 ENCOUNTER — Telehealth: Payer: Self-pay

## 2022-11-01 NOTE — Telephone Encounter (Signed)
PA submitted through Cover My Meds for [facility]. Awaiting insurance determination. Key: [id number]

## 2022-11-28 ENCOUNTER — Ambulatory Visit: Payer: BC Managed Care – PPO | Admitting: Nurse Practitioner

## 2022-12-26 ENCOUNTER — Ambulatory Visit: Payer: BC Managed Care – PPO | Admitting: Nurse Practitioner

## 2022-12-26 ENCOUNTER — Encounter: Payer: Self-pay | Admitting: Nurse Practitioner

## 2022-12-26 VITALS — BP 118/75 | HR 89 | Temp 98.2°F | Ht 67.0 in | Wt 306.0 lb

## 2022-12-26 DIAGNOSIS — I10 Essential (primary) hypertension: Secondary | ICD-10-CM | POA: Diagnosis not present

## 2022-12-26 DIAGNOSIS — E559 Vitamin D deficiency, unspecified: Secondary | ICD-10-CM

## 2022-12-26 DIAGNOSIS — Z7985 Long-term (current) use of injectable non-insulin antidiabetic drugs: Secondary | ICD-10-CM

## 2022-12-26 DIAGNOSIS — E119 Type 2 diabetes mellitus without complications: Secondary | ICD-10-CM

## 2022-12-26 DIAGNOSIS — Z6841 Body Mass Index (BMI) 40.0 and over, adult: Secondary | ICD-10-CM

## 2022-12-26 MED ORDER — TIRZEPATIDE 5 MG/0.5ML ~~LOC~~ SOAJ
5.0000 mg | SUBCUTANEOUS | 0 refills | Status: DC
Start: 2022-12-26 — End: 2023-01-23

## 2022-12-26 MED ORDER — LISINOPRIL 5 MG PO TABS: 5.0000 mg | ORAL_TABLET | Freq: Every day | ORAL | 0 refills | Status: DC

## 2022-12-26 MED ORDER — VITAMIN D (ERGOCALCIFEROL) 1.25 MG (50000 UNIT) PO CAPS: 50000.0000 [IU] | ORAL_CAPSULE | ORAL | 0 refills | Status: DC

## 2022-12-26 NOTE — Progress Notes (Signed)
Office: [phone]  /  Fax: [phone]  WEIGHT SUMMARY AND BIOMETRICS  Weight Lost Since Last Visit: 9lb  Weight Gained Since Last Visit: 0lb   Vitals Temp: 98.2 F (36.8 C) BP: 118/75 Pulse Rate: 89 SpO2: 99 %   Anthropometric Measurements Height: 5\' 7"  (1.702 m) Weight: (!) 306 lb (138.8 kg) BMI (Calculated): 47.92 Weight at Last Visit: 315lb Weight Lost Since Last Visit: 9lb Weight Gained Since Last Visit: 0lb Starting Weight: 331lb Total Weight Loss (lbs): 25 lb (11.3 kg)   Body Composition  Body Fat %: 47.7 % Fat Mass (lbs): 146.2 lbs Muscle Mass (lbs): 152.2 lbs Total Body Water (lbs): 111 lbs Visceral Fat Rating : 15   Other Clinical Data Fasting: Yes Labs: No Today's Visit #: 31 Starting Date: 04/29/19     HPI  Chief Complaint: OBESITY  Katrina Carlson is here to discuss her progress with her obesity treatment plan. She is on the the Category 4 Plan and states she is following her eating plan approximately 75 % of the time. She states she is exercising 20 minutes 3 days per week.   Interval History:  Since last office visit she has lost 9 pounds.  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  She is working on increasing her water intake.  She is planning to increase her activity.    Her highest weight was 343 lbs  Pharmacotherapy for weight loss: She is not currently taking medications  for medical weight loss.     Previous pharmacotherapy for medical weight loss:   [place].  She wasn't able to increase Wegovy due to [employer]. Now unable to take due to cost    Bariatric surgery:  Patient has not had bariatric surgery.   Pharmacotherapy for DMT2:  She is currently taking Mounjaro 5mg .  Denies side effects.   Last A1c was 6.1 She is not checking BS at home.   Episodes of hypoglycemia: no On ACE-lisinopril 5 mg Last eye exam:  2024 She has tried Ozempic in the past.    Lab Results  Component Value Date    HGBA1C 6.1 (H) 06/27/2022   HGBA1C 6.3 (H) 03/22/2022   HGBA1C 7.3 (H) 12/14/2021   Lab Results  Component Value Date   LDLCALC 87 12/14/2021   CREATININE 0.75 09/06/2022     Vit D deficiency  She is taking Vit D 50,000 IU weekly.  Denies side effects.  Denies nausea, vomiting or muscle weakness.    Lab Results  Component Value Date   VD25OH 6.9 (L) 06/27/2022   VD25OH 7.4 (L) 03/22/2022   VD25OH 8.1 (L) 12/14/2021     Hypertension Hypertension looks better.  Medication(s): Lisinopril 5mg . Denies side effects.  Denies chest pain, palpitations and SOB.  BP Readings from Last 3 Encounters:  12/26/22 118/75  10/31/22 134/83  10/05/22 132/87   Lab Results  Component Value Date   CREATININE 0.75 09/06/2022   CREATININE 0.72 09/05/2022   CREATININE 0.65 06/27/2022     PHYSICAL EXAM:  Blood pressure 118/75, pulse 89, temperature 98.2 F (36.8 C), height 5\' 7"  (1.702 m), weight (!) 306 lb (138.8 kg), last menstrual period 12/24/2022, SpO2 99%. Body mass index is 47.93 kg/m.  General: She is overweight, cooperative, alert, well developed, and in no acute distress. PSYCH: Has normal mood, affect and thought process.   Extremities: No edema.  Neurologic: No gross sensory or motor deficits. No tremors or fasciculations noted.    DIAGNOSTIC DATA  REVIEWED:  BMET    Component Value Date/Time   NA 135 09/06/2022 0118   NA 136 06/27/2022 1229   K 3.5 09/06/2022 0118   CL 104 09/06/2022 0118   CO2 25 09/06/2022 0118   GLUCOSE 100 (H) 09/06/2022 0118   BUN <5 (L) 09/06/2022 0118   BUN 5 (L) 06/27/2022 1229   CREATININE 0.75 09/06/2022 0118   CALCIUM 8.1 (L) 09/06/2022 0118   GFRNONAA >60 09/06/2022 0118   GFRAA 138 09/23/2019 1202   Lab Results  Component Value Date   HGBA1C 6.1 (H) 06/27/2022   HGBA1C 5.6 09/23/2019   Lab Results  Component Value Date   INSULIN 99.6 (H) 03/22/2022   INSULIN 166.0 (H) 09/23/2019   Lab Results  Component Value Date   TSH  1.700 12/14/2021   CBC    Component Value Date/Time   WBC 10.0 09/06/2022 0118   RBC 4.68 09/06/2022 0118   HGB 14.1 09/06/2022 0118   HGB 14.7 12/14/2021 1619   HCT 43.2 09/06/2022 0118   HCT 43.6 12/14/2021 1619   PLT 323 09/06/2022 0118   PLT 323 12/14/2021 1619   MCV 92.3 09/06/2022 0118   MCV 90 12/14/2021 1619   MCH 30.1 09/06/2022 0118   MCHC 32.6 09/06/2022 0118   RDW 12.9 09/06/2022 0118   RDW 12.3 12/14/2021 1619   Iron Studies    Component Value Date/Time   IRON 54 05/26/2020 1011   TIBC 318 05/26/2020 1011   FERRITIN 77 05/26/2020 1011   IRONPCTSAT 17 05/26/2020 1011   Lipid Panel     Component Value Date/Time   CHOL 144 12/14/2021 1619   TRIG 125 12/14/2021 1619   HDL 34 (L) 12/14/2021 1619   CHOLHDL 4.0 02/01/2021 1146   LDLCALC 87 12/14/2021 1619   Hepatic Function Panel     Component Value Date/Time   PROT 7.5 09/06/2022 0118   PROT 7.6 06/27/2022 1229   ALBUMIN 3.1 (L) 09/06/2022 0118   ALBUMIN 3.9 06/27/2022 1229   AST 16 09/06/2022 0118   ALT 17 09/06/2022 0118   ALKPHOS 49 09/06/2022 0118   BILITOT 1.1 09/06/2022 0118   BILITOT 0.5 06/27/2022 1229      Component Value Date/Time   TSH 1.700 12/14/2021 1619   Nutritional Lab Results  Component Value Date   VD25OH 6.9 (L) 06/27/2022   VD25OH 7.4 (L) 03/22/2022   VD25OH 8.1 (L) 12/14/2021     ASSESSMENT AND PLAN  TREATMENT PLAN FOR OBESITY:  Recommended Dietary Goals  Makayela is currently in the action stage of change. As such, her goal is to continue weight management plan. She has agreed to the Category 4 Plan.  Behavioral Intervention  We discussed the following Behavioral Modification Strategies today: continue to work on maintaining a reduced calorie state, getting the recommended amount of protein, incorporating whole foods, making healthy choices, staying well hydrated and practicing mindfulness when eating..  Additional resources provided today: NA  Recommended  Physical Activity Goals  Loma has been advised to work up to 150 minutes of moderate intensity aerobic activity a week and strengthening exercises 2-3 times per week for cardiovascular health, weight loss maintenance and preservation of muscle mass.   She has agreed to Continue current level of physical activity , Think about enjoyable ways to increase daily physical activity and overcoming barriers to exercise, Increase physical activity in their day and reduce sedentary time (increase NEAT)., Increase the intensity, frequency or duration of strengthening exercises , and Increase the  intensity, frequency or duration of aerobic exercises     ASSOCIATED CONDITIONS ADDRESSED TODAY  Action/Plan  Type 2 diabetes mellitus without complication, without long-term current use of insulin (HCC) -     Continue Tirzepatide; Inject 5 mg into the skin once a week.  Dispense: 2 mL; Refill: 0.  Side effects discussed.   Vitamin D deficiency -     Vitamin D (Ergocalciferol); Take 1 capsule (50,000 Units total) by mouth every 7 (seven) days.  Dispense: 5 capsule; Refill: 0  Essential hypertension -     Lisinopril; Take 1 tablet (5 mg total) by mouth daily.  Dispense: 30 tablet; Refill: 0. Side effects discussed.  Morbid obesity (HCC)  BMI 45.0-49.9, adult (HCC)      Return in about 4 weeks (around 01/23/2023).Marland Kitchen She was informed of the importance of frequent follow up visits to maximize her success with intensive lifestyle modifications for her multiple health conditions.   ATTESTASTION STATEMENTS:  Reviewed by clinician on day of visit: allergies, medications, problem list, medical history, surgical history, family history, social history, and previous encounter notes.    Theodis Sato. Bradely Rudin FNP-C

## 2023-01-23 ENCOUNTER — Encounter: Payer: Self-pay | Admitting: Nurse Practitioner

## 2023-01-23 ENCOUNTER — Ambulatory Visit: Payer: BC Managed Care – PPO | Admitting: Nurse Practitioner

## 2023-01-23 VITALS — BP 145/83 | HR 69 | Temp 97.9°F | Ht 67.0 in | Wt 301.0 lb

## 2023-01-23 DIAGNOSIS — Z6841 Body Mass Index (BMI) 40.0 and over, adult: Secondary | ICD-10-CM | POA: Diagnosis not present

## 2023-01-23 DIAGNOSIS — E559 Vitamin D deficiency, unspecified: Secondary | ICD-10-CM

## 2023-01-23 DIAGNOSIS — E119 Type 2 diabetes mellitus without complications: Secondary | ICD-10-CM | POA: Diagnosis not present

## 2023-01-23 DIAGNOSIS — Z7985 Long-term (current) use of injectable non-insulin antidiabetic drugs: Secondary | ICD-10-CM

## 2023-01-23 MED ORDER — VITAMIN D (ERGOCALCIFEROL) 1.25 MG (50000 UNIT) PO CAPS: 50000.0000 [IU] | ORAL_CAPSULE | ORAL | 0 refills | Status: DC

## 2023-01-23 MED ORDER — TIRZEPATIDE 5 MG/0.5ML ~~LOC~~ SOAJ: 5.0000 mg | SUBCUTANEOUS | 0 refills | Status: DC

## 2023-01-23 NOTE — Progress Notes (Signed)
Office: [phone]  /  Fax: [phone]  WEIGHT SUMMARY AND BIOMETRICS  Weight Lost Since Last Visit: 5lb  Weight Gained Since Last Visit: 0lb   Vitals Temp: 97.9 F (36.6 C) BP: (!) 145/83 Pulse Rate: 69 SpO2: 100 %   Anthropometric Measurements Height: 5\' 7"  (1.702 m) Weight: (!) 301 lb (136.5 kg) BMI (Calculated): 47.13 Weight at Last Visit: 306lb Weight Lost Since Last Visit: 5lb Weight Gained Since Last Visit: 0lb Starting Weight: 331lb Total Weight Loss (lbs): 30 lb (13.6 kg)   Body Composition  Body Fat %: 51.6 % Fat Mass (lbs): 155.6 lbs Muscle Mass (lbs): 138.4 lbs Total Body Water (lbs): 111.4 lbs Visceral Fat Rating : 16   Other Clinical Data Fasting: No Labs: No Today's Visit #: 32 Starting Date: 04/29/19     HPI  Chief Complaint: OBESITY  Katrina Carlson is here to discuss her progress with her obesity treatment plan. She is on the the Category 4 Plan and states she is following her eating plan approximately 65 % of the time. She states she is exercising 0 minutes 0 days per week.   Interval History:  Since last office visit she has lost 5 pounds.  She hasn't been able to exercise due to starting a second job.  She is working up to 18 hours per day.  Plans to start back walking. She is trying to figure out her new routine.    Her highest weight was 343 lbs   Pharmacotherapy for weight loss: She is not currently taking medications  for medical weight loss.     Previous pharmacotherapy for medical weight loss:   [place].  She wasn't able to increase Wegovy due to [employer]. Now unable to take due to cost    Bariatric surgery:  Patient has not had bariatric surgery.   Pharmacotherapy for DMT2:  She is currently taking Mounjaro 5mg .  Denies side effects.   Last A1c was 6.1 on 06/27/22 She is not checking BS at home.   Episodes of hypoglycemia: no On ACE   Last eye exam:  2024 She has tried Ozempic in the past.    Lab  Results  Component Value Date   HGBA1C 6.1 (H) 06/27/2022   HGBA1C 6.3 (H) 03/22/2022   HGBA1C 7.3 (H) 12/14/2021   Lab Results  Component Value Date   LDLCALC 87 12/14/2021   CREATININE 0.75 09/06/2022     Vit D deficiency  She is taking Vit D 50,000 IU weekly.  Denies side effects.  Denies nausea, vomiting or muscle weakness.    Lab Results  Component Value Date   VD25OH 6.9 (L) 06/27/2022   VD25OH 7.4 (L) 03/22/2022   VD25OH 8.1 (L) 12/14/2021      PHYSICAL EXAM:  Blood pressure (!) 145/83, pulse 69, temperature 97.9 F (36.6 C), height 5\' 7"  (1.702 m), weight (!) 301 lb (136.5 kg), last menstrual period 01/01/2023, SpO2 100%. Body mass index is 47.14 kg/m.  General: She is overweight, cooperative, alert, well developed, and in no acute distress. PSYCH: Has normal mood, affect and thought process.   Extremities: No edema.  Neurologic: No gross sensory or motor deficits. No tremors or fasciculations noted.    DIAGNOSTIC DATA REVIEWED:  BMET    Component Value Date/Time   NA 135 09/06/2022 0118   NA 136 06/27/2022 1229   K 3.5 09/06/2022 0118   CL 104 09/06/2022 0118   CO2 25 09/06/2022 0118   GLUCOSE 100 (H) 09/06/2022  0118   BUN <5 (L) 09/06/2022 0118   BUN 5 (L) 06/27/2022 1229   CREATININE 0.75 09/06/2022 0118   CALCIUM 8.1 (L) 09/06/2022 0118   GFRNONAA >60 09/06/2022 0118   GFRAA 138 09/23/2019 1202   Lab Results  Component Value Date   HGBA1C 6.1 (H) 06/27/2022   HGBA1C 5.6 09/23/2019   Lab Results  Component Value Date   INSULIN 99.6 (H) 03/22/2022   INSULIN 166.0 (H) 09/23/2019   Lab Results  Component Value Date   TSH 1.700 12/14/2021   CBC    Component Value Date/Time   WBC 10.0 09/06/2022 0118   RBC 4.68 09/06/2022 0118   HGB 14.1 09/06/2022 0118   HGB 14.7 12/14/2021 1619   HCT 43.2 09/06/2022 0118   HCT 43.6 12/14/2021 1619   PLT 323 09/06/2022 0118   PLT 323 12/14/2021 1619   MCV 92.3 09/06/2022 0118   MCV 90 12/14/2021  1619   MCH 30.1 09/06/2022 0118   MCHC 32.6 09/06/2022 0118   RDW 12.9 09/06/2022 0118   RDW 12.3 12/14/2021 1619   Iron Studies    Component Value Date/Time   IRON 54 05/26/2020 1011   TIBC 318 05/26/2020 1011   FERRITIN 77 05/26/2020 1011   IRONPCTSAT 17 05/26/2020 1011   Lipid Panel     Component Value Date/Time   CHOL 144 12/14/2021 1619   TRIG 125 12/14/2021 1619   HDL 34 (L) 12/14/2021 1619   CHOLHDL 4.0 02/01/2021 1146   LDLCALC 87 12/14/2021 1619   Hepatic Function Panel     Component Value Date/Time   PROT 7.5 09/06/2022 0118   PROT 7.6 06/27/2022 1229   ALBUMIN 3.1 (L) 09/06/2022 0118   ALBUMIN 3.9 06/27/2022 1229   AST 16 09/06/2022 0118   ALT 17 09/06/2022 0118   ALKPHOS 49 09/06/2022 0118   BILITOT 1.1 09/06/2022 0118   BILITOT 0.5 06/27/2022 1229      Component Value Date/Time   TSH 1.700 12/14/2021 1619   Nutritional Lab Results  Component Value Date   VD25OH 6.9 (L) 06/27/2022   VD25OH 7.4 (L) 03/22/2022   VD25OH 8.1 (L) 12/14/2021     ASSESSMENT AND PLAN  TREATMENT PLAN FOR OBESITY:  Recommended Dietary Goals  Katrina Carlson is currently in the action stage of change. As such, her goal is to continue weight management plan. She has agreed to the Category 4 Plan.  Behavioral Intervention  We discussed the following Behavioral Modification Strategies today: increasing lean protein intake to established goals, decreasing simple carbohydrates , increasing fiber rich foods, increasing water intake , work on meal planning and preparation, planning for success, and continue to work on maintaining a reduced calorie state, getting the recommended amount of protein, incorporating whole foods, making healthy choices, staying well hydrated and practicing mindfulness when eating..  Additional resources provided today: NA  Recommended Physical Activity Goals  Katrina Carlson has been advised to work up to 150 minutes of moderate intensity aerobic activity a week  and strengthening exercises 2-3 times per week for cardiovascular health, weight loss maintenance and preservation of muscle mass.   She has agreed to Think about enjoyable ways to increase daily physical activity and overcoming barriers to exercise, Increase physical activity in their day and reduce sedentary time (increase NEAT)., and Work on scheduling and tracking physical activity.     ASSOCIATED CONDITIONS ADDRESSED TODAY  Action/Plan  Type 2 diabetes mellitus without complication, without long-term current use of insulin (HCC) -  Continue Tirzepatide; Inject 5 mg into the skin once a week.  Dispense: 2 mL; Refill: 0.  Side effects discussed.  Good blood sugar control is important to decrease the likelihood of diabetic complications such as nephropathy, neuropathy, limb loss, blindness, coronary artery disease, and death. Intensive lifestyle modification including diet, exercise and weight loss are the first line of treatment for diabetes.    Vitamin D deficiency -     Vitamin D (Ergocalciferol); Take 1 capsule (50,000 Units total) by mouth every 7 (seven) days.  Dispense: 5 capsule; Refill: 0  Low Vitamin D level contributes to fatigue and are associated with obesity, breast, and colon cancer. She agrees to continue to take prescription Vitamin D @50 ,000 IU every week and will follow-up for routine testing of Vitamin D, at least 2-3 times per year to avoid over-replacement.   Morbid obesity (HCC)  BMI 45.0-49.9, adult (HCC)       Will obtain labs next visit.   Return in about 6 weeks (around 03/06/2023).Marland Kitchen She was informed of the importance of frequent follow up visits to maximize her success with intensive lifestyle modifications for her multiple health conditions.   ATTESTASTION STATEMENTS:  Reviewed by clinician on day of visit: allergies, medications, problem list, medical history, surgical history, family history, social history, and previous encounter notes.      Theodis Sato. Priscella Donna FNP-C

## 2023-02-01 ENCOUNTER — Other Ambulatory Visit: Payer: Self-pay | Admitting: Nurse Practitioner

## 2023-02-01 DIAGNOSIS — I10 Essential (primary) hypertension: Secondary | ICD-10-CM

## 2023-03-06 ENCOUNTER — Ambulatory Visit: Payer: BC Managed Care – PPO | Admitting: Nurse Practitioner

## 2023-03-29 ENCOUNTER — Ambulatory Visit: Payer: 59 | Admitting: Nurse Practitioner

## 2023-03-29 ENCOUNTER — Encounter: Payer: Self-pay | Admitting: Nurse Practitioner

## 2023-03-29 VITALS — BP 136/81 | HR 72 | Temp 98.0°F | Ht 67.0 in | Wt 301.0 lb

## 2023-03-29 DIAGNOSIS — Z6841 Body Mass Index (BMI) 40.0 and over, adult: Secondary | ICD-10-CM

## 2023-03-29 DIAGNOSIS — E559 Vitamin D deficiency, unspecified: Secondary | ICD-10-CM

## 2023-03-29 DIAGNOSIS — I10 Essential (primary) hypertension: Secondary | ICD-10-CM | POA: Diagnosis not present

## 2023-03-29 DIAGNOSIS — E66813 Obesity, class 3: Secondary | ICD-10-CM

## 2023-03-29 DIAGNOSIS — Z13220 Encounter for screening for lipoid disorders: Secondary | ICD-10-CM

## 2023-03-29 DIAGNOSIS — Z7985 Long-term (current) use of injectable non-insulin antidiabetic drugs: Secondary | ICD-10-CM

## 2023-03-29 DIAGNOSIS — Z79899 Other long term (current) drug therapy: Secondary | ICD-10-CM

## 2023-03-29 DIAGNOSIS — E119 Type 2 diabetes mellitus without complications: Secondary | ICD-10-CM | POA: Diagnosis not present

## 2023-03-29 MED ORDER — VITAMIN D (ERGOCALCIFEROL) 1.25 MG (50000 UNIT) PO CAPS: 50000.0000 [IU] | ORAL_CAPSULE | ORAL | 0 refills | Status: DC

## 2023-03-29 MED ORDER — TIRZEPATIDE 5 MG/0.5ML ~~LOC~~ SOAJ: 5.0000 mg | SUBCUTANEOUS | 0 refills | Status: DC

## 2023-03-29 NOTE — Progress Notes (Signed)
 Office: [phone]  /  Fax: [phone]  WEIGHT SUMMARY AND BIOMETRICS  Weight Lost Since Last Visit: 0lb  Weight Gained Since Last Visit: 0lb   Vitals Temp: 98 F (36.7 C) BP: 136/81 Pulse Rate: 72 SpO2: 98 %   Anthropometric Measurements Height: 5' 7 (1.702 m) Weight: (!) 301 lb (136.5 kg) BMI (Calculated): 47.13 Weight at Last Visit: 301lb Weight Lost Since Last Visit: 0lb Weight Gained Since Last Visit: 0lb Starting Weight: 331lb Total Weight Loss (lbs): 30 lb (13.6 kg)   Body Composition  Body Fat %: 49.8 % Fat Mass (lbs): 149.8 lbs Muscle Mass (lbs): 143.6 lbs Total Body Water (lbs): 107.6 lbs Visceral Fat Rating : 16   Other Clinical Data Fasting: Yes Labs: Yes Today's Visit #: 67 Starting Date: 04/29/19     HPI  Chief Complaint: OBESITY  Katrina Carlson is here to discuss her progress with her obesity treatment plan. She is on the the Category 4 Plan and states she is following her eating plan approximately 70 % of the time. She states she is exercising 0 minutes 0 days per week.   Interval History:  Since last office visit she has maintained her weight.  She notes she had some set backs over the holidays and with the new year.  It was her first Christmas without her father. She has had some management changes at work.  She has been exercising using youtube videos.  He goal is to start walking again.   Her highest weight was 343 lbs   Pharmacotherapy for weight loss: She is not currently taking medications  for medical weight loss.     Previous pharmacotherapy for medical weight loss:   Saxenda  and Wegovy .  She wasn't able to increase Wegovy  due to scientist, clinical (histocompatibility and immunogenetics). Now unable to take due to cost    Bariatric surgery:  Patient has not had bariatric surgery.   Pharmacotherapy for DMT2:  She is currently taking Mounjaro  5mg .  Denies side effects.   Last A1c was 6.1 She is not checking BS at home.   Episodes of hypoglycemia: no On ACE    Last eye exam:  2024 She has tried Ozempic  in the past.    Lab Results  Component Value Date   HGBA1C 6.1 (H) 06/27/2022   HGBA1C 6.3 (H) 03/22/2022   HGBA1C 7.3 (H) 12/14/2021   Lab Results  Component Value Date   LDLCALC 87 12/14/2021   CREATININE 0.75 09/06/2022    Vit D deficiency  She is taking Vit D 50,000 IU weekly.  Denies side effects.  Denies nausea, vomiting or muscle weakness.    Lab Results  Component Value Date   VD25OH 6.9 (L) 06/27/2022   VD25OH 7.4 (L) 03/22/2022   VD25OH 8.1 (L) 12/14/2021    Hypertension Hypertension stable.  Medication(s): she is not currently taking Lisinopril -took last one month ago.   Denies chest pain, palpitations and SOB.  BP Readings from Last 3 Encounters:  03/29/23 136/81  01/23/23 (!) 145/83  12/26/22 118/75   Lab Results  Component Value Date   CREATININE 0.75 09/06/2022   CREATININE 0.72 09/05/2022   CREATININE 0.65 06/27/2022    PHYSICAL EXAM:  Blood pressure 136/81, pulse 72, temperature 98 F (36.7 C), height 5' 7 (1.702 m), weight (!) 301 lb (136.5 kg), last menstrual period 03/05/2023, SpO2 98%. Body mass index is 47.14 kg/m.  General: She is overweight, cooperative, alert, well developed, and in no acute distress. PSYCH: Has normal mood, affect and thought  process.   Extremities: No edema.  Neurologic: No gross sensory or motor deficits. No tremors or fasciculations noted.    DIAGNOSTIC DATA REVIEWED:  BMET    Component Value Date/Time   NA 135 09/06/2022 0118   NA 136 06/27/2022 1229   K 3.5 09/06/2022 0118   CL 104 09/06/2022 0118   CO2 25 09/06/2022 0118   GLUCOSE 100 (H) 09/06/2022 0118   BUN <5 (L) 09/06/2022 0118   BUN 5 (L) 06/27/2022 1229   CREATININE 0.75 09/06/2022 0118   CALCIUM 8.1 (L) 09/06/2022 0118   GFRNONAA >60 09/06/2022 0118   GFRAA 138 09/23/2019 1202   Lab Results  Component Value Date   HGBA1C 6.1 (H) 06/27/2022   HGBA1C 5.6 09/23/2019   Lab Results  Component  Value Date   INSULIN  99.6 (H) 03/22/2022   INSULIN  166.0 (H) 09/23/2019   Lab Results  Component Value Date   TSH 1.700 12/14/2021   CBC    Component Value Date/Time   WBC 10.0 09/06/2022 0118   RBC 4.68 09/06/2022 0118   HGB 14.1 09/06/2022 0118   HGB 14.7 12/14/2021 1619   HCT 43.2 09/06/2022 0118   HCT 43.6 12/14/2021 1619   PLT 323 09/06/2022 0118   PLT 323 12/14/2021 1619   MCV 92.3 09/06/2022 0118   MCV 90 12/14/2021 1619   MCH 30.1 09/06/2022 0118   MCHC 32.6 09/06/2022 0118   RDW 12.9 09/06/2022 0118   RDW 12.3 12/14/2021 1619   Iron Studies    Component Value Date/Time   IRON 54 05/26/2020 1011   TIBC 318 05/26/2020 1011   FERRITIN 77 05/26/2020 1011   IRONPCTSAT 17 05/26/2020 1011   Lipid Panel     Component Value Date/Time   CHOL 144 12/14/2021 1619   TRIG 125 12/14/2021 1619   HDL 34 (L) 12/14/2021 1619   CHOLHDL 4.0 02/01/2021 1146   LDLCALC 87 12/14/2021 1619   Hepatic Function Panel     Component Value Date/Time   PROT 7.5 09/06/2022 0118   PROT 7.6 06/27/2022 1229   ALBUMIN 3.1 (L) 09/06/2022 0118   ALBUMIN 3.9 06/27/2022 1229   AST 16 09/06/2022 0118   ALT 17 09/06/2022 0118   ALKPHOS 49 09/06/2022 0118   BILITOT 1.1 09/06/2022 0118   BILITOT 0.5 06/27/2022 1229      Component Value Date/Time   TSH 1.700 12/14/2021 1619   Nutritional Lab Results  Component Value Date   VD25OH 6.9 (L) 06/27/2022   VD25OH 7.4 (L) 03/22/2022   VD25OH 8.1 (L) 12/14/2021     ASSESSMENT AND PLAN  TREATMENT PLAN FOR OBESITY:  Recommended Dietary Goals  Katrina Carlson is currently in the action stage of change. As such, her goal is to continue weight management plan. She has agreed to the Category 4 Plan.  Behavioral Intervention  We discussed the following Behavioral Modification Strategies today: increasing lean protein intake to established goals, decreasing simple carbohydrates , increasing vegetables, increasing water intake , work on meal  planning and preparation, reading food labels , keeping healthy foods at home, work on managing stress, creating time for self-care and relaxation, continue to work on implementation of reduced calorie nutritional plan, continue to practice mindfulness when eating, planning for success, and continue to work on maintaining a reduced calorie state, getting the recommended amount of protein, incorporating whole foods, making healthy choices, staying well hydrated and practicing mindfulness when eating..  Additional resources provided today: NA  Recommended Physical Activity Goals  Anagha  has been advised to work up to 150 minutes of moderate intensity aerobic activity a week and strengthening exercises 2-3 times per week for cardiovascular health, weight loss maintenance and preservation of muscle mass.   She has agreed to Think about enjoyable ways to increase daily physical activity and overcoming barriers to exercise, Increase physical activity in their day and reduce sedentary time (increase NEAT)., and Work on scheduling and tracking physical activity.      ASSOCIATED CONDITIONS ADDRESSED TODAY  Action/Plan  Type 2 diabetes mellitus without complication, without long-term current use of insulin  (HCC) -     Hemoglobin A1c -     Insulin , random -     Continue Tirzepatide ; Inject 5 mg into the skin once a week.  Dispense: 2 mL; Refill: 0.  Side effects discussed. Patient doesn't want to change dose at this time.  Will consider increasing based upon A1c results.     Vitamin D  deficiency -     VITAMIN D  25 Hydroxy (Vit-D Deficiency, Fractures) -     Vitamin D  (Ergocalciferol ); Take 1 capsule (50,000 Units total) by mouth every 7 (seven) days.  Dispense: 5 capsule; Refill: 0  Low Vitamin D  level contributes to fatigue and are associated with obesity, breast, and colon cancer. She agrees to continue to take prescription Vitamin D  @50 ,000 IU every week and will follow-up for routine testing of  Vitamin D , at least 2-3 times per year to avoid over-replacement.   Essential hypertension Patient is not currently taking Lisinopril .  I've encouraged her to take it as directed.    Screening for hyperlipidemia -     Lipid Panel With LDL/HDL Ratio  Medication management -     Comprehensive metabolic panel -     TSH  Class 3 severe obesity due to excess calories without serious comorbidity with body mass index (BMI) of 45.0 to 49.9 in adult (HCC) -     TSH         Return in about 4 weeks (around 04/26/2023).SABRA She was informed of the importance of frequent follow up visits to maximize her success with intensive lifestyle modifications for her multiple health conditions.   ATTESTASTION STATEMENTS:  Reviewed by clinician on day of visit: allergies, medications, problem list, medical history, surgical history, family history, social history, and previous encounter notes.    Corean SAUNDERS. Drexler Maland FNP-C

## 2023-03-30 LAB — COMPREHENSIVE METABOLIC PANEL
ALT: 30 [IU]/L (ref 0–32)
AST: 34 [IU]/L (ref 0–40)
Albumin: 3.8 g/dL — ABNORMAL LOW (ref 3.9–4.9)
Alkaline Phosphatase: 67 [IU]/L (ref 44–121)
BUN/Creatinine Ratio: 12 (ref 9–23)
BUN: 9 mg/dL (ref 6–20)
Bilirubin Total: 0.6 mg/dL (ref 0.0–1.2)
CO2: 23 mmol/L (ref 20–29)
Calcium: 9 mg/dL (ref 8.7–10.2)
Chloride: 104 mmol/L (ref 96–106)
Creatinine, Ser: 0.73 mg/dL (ref 0.57–1.00)
Globulin, Total: 3.8 g/dL (ref 1.5–4.5)
Glucose: 85 mg/dL (ref 70–99)
Potassium: 4.2 mmol/L (ref 3.5–5.2)
Sodium: 141 mmol/L (ref 134–144)
Total Protein: 7.6 g/dL (ref 6.0–8.5)
eGFR: 109 mL/min/{1.73_m2} (ref >59)

## 2023-03-30 LAB — VITAMIN D 25 HYDROXY (VIT D DEFICIENCY, FRACTURES): Vit D, 25-Hydroxy: 11.4 ng/mL — ABNORMAL LOW (ref 30.0–100.0)

## 2023-03-30 LAB — HEMOGLOBIN A1C
Est. average glucose Bld gHb Est-mCnc: 117 mg/dL
Hgb A1c MFr Bld: 5.7 % — ABNORMAL HIGH (ref 4.8–5.6)

## 2023-03-30 LAB — INSULIN, RANDOM: INSULIN: 47.1 u[IU]/mL — ABNORMAL HIGH (ref 2.6–24.9)

## 2023-03-30 LAB — LIPID PANEL WITH LDL/HDL RATIO
Cholesterol, Total: 139 mg/dL (ref 100–199)
HDL: 33 mg/dL — ABNORMAL LOW (ref >39)
LDL Chol Calc (NIH): 90 mg/dL (ref 0–99)
LDL/HDL Ratio: 2.7 {ratio} (ref 0.0–3.2)
Triglycerides: 85 mg/dL (ref 0–149)
VLDL Cholesterol Cal: 16 mg/dL (ref 5–40)

## 2023-03-30 LAB — TSH: TSH: 1 u[IU]/mL (ref 0.450–4.500)

## 2023-05-01 ENCOUNTER — Ambulatory Visit: Payer: 59 | Admitting: Nurse Practitioner

## 2023-06-14 ENCOUNTER — Encounter: Payer: Self-pay | Admitting: Nurse Practitioner

## 2023-06-14 ENCOUNTER — Ambulatory Visit: Admitting: Nurse Practitioner

## 2023-06-14 VITALS — BP 121/78 | HR 70 | Temp 98.1°F | Ht 67.0 in | Wt 297.0 lb

## 2023-06-14 DIAGNOSIS — Z7985 Long-term (current) use of injectable non-insulin antidiabetic drugs: Secondary | ICD-10-CM

## 2023-06-14 DIAGNOSIS — E119 Type 2 diabetes mellitus without complications: Secondary | ICD-10-CM

## 2023-06-14 DIAGNOSIS — E66813 Obesity, class 3: Secondary | ICD-10-CM

## 2023-06-14 DIAGNOSIS — E559 Vitamin D deficiency, unspecified: Secondary | ICD-10-CM

## 2023-06-14 DIAGNOSIS — E786 Lipoprotein deficiency: Secondary | ICD-10-CM | POA: Diagnosis not present

## 2023-06-14 DIAGNOSIS — Z6841 Body Mass Index (BMI) 40.0 and over, adult: Secondary | ICD-10-CM

## 2023-06-14 MED ORDER — TIRZEPATIDE 5 MG/0.5ML ~~LOC~~ SOAJ: 5.0000 mg | SUBCUTANEOUS | 0 refills | Status: DC

## 2023-06-14 MED ORDER — VITAMIN D (ERGOCALCIFEROL) 1.25 MG (50000 UNIT) PO CAPS: 50000.0000 [IU] | ORAL_CAPSULE | ORAL | 0 refills | Status: DC

## 2023-06-14 NOTE — Patient Instructions (Signed)
 HDL, or high-density lipoprotein, is often referred to as "good" cholesterol because it helps remove LDL (low-density lipoprotein), or "bad" cholesterol, from the arteries and transport it back to the liver for elimination. High levels of HDL can lower your risk of heart disease and stroke.  Here's a more detailed breakdown: What it is: HDL is a type of fat (lipid) in your blood that helps transport cholesterol.  Its function: HDL carries LDL cholesterol away from the arteries and back to the liver, where it's processed and removed from the body.  Why it's "good": By removing LDL, HDL helps prevent the buildup of cholesterol on artery walls, which can lead to heart disease and stroke.  Ideal levels: While there's no universally agreed-upon ideal level, aiming for an HDL cholesterol level of 60 mg/dL or higher is generally recommended for protection against heart disease. For men, a level of 40 mg/dL or higher is considered healthy, while for women, it's 50 mg/dL or higher.  Factors affecting HDL levels: HDL levels can be influenced by genetics, lifestyle choices (such as exercise and diet), and certain medications.  Increasing HDL levels: You can increase your HDL levels through lifestyle changes like regular exercise, quitting smoking, and following a heart-healthy diet.

## 2023-06-14 NOTE — Progress Notes (Signed)
 Office: [phone]  /  Fax: [phone]  WEIGHT SUMMARY AND BIOMETRICS  Weight Lost Since Last Visit: 4lb  Weight Gained Since Last Visit: 0lb   Vitals Temp: 98.1 F (36.7 C) BP: 121/78 Pulse Rate: 70 SpO2: 100 %   Anthropometric Measurements Height: 5\' 7"  (1.702 m) Weight: 297 lb (134.7 kg) BMI (Calculated): 46.51 Weight at Last Visit: 301lb Weight Lost Since Last Visit: 4lb Weight Gained Since Last Visit: 0lb Starting Weight: 331lb Total Weight Loss (lbs): 34 lb (15.4 kg)   Body Composition  Body Fat %: 47.9 % Fat Mass (lbs): 142.6 lbs Muscle Mass (lbs): 147.4 lbs Total Body Water (lbs): 113 lbs Visceral Fat Rating : 15   Other Clinical Data Fasting: Yes Labs: No Today's Visit #: 34 Starting Date: 04/29/19     HPI  Chief Complaint: OBESITY  Katrina Carlson is here to discuss her progress with her obesity treatment plan. She is on the the Category 4 Plan and states she is following her eating plan approximately 75 % of the time. She states she is exercising 30 minutes 3 days per week.   Interval History:  Since last office visit she has lost 4 pounds.  She is working on increasing protein intake.  She is drinking water, apple juice, soda and Gatorade.  Denies polyphagia or cravings.   Pharmacotherapy for weight loss: She is not currently taking medications  for medical weight loss.     Previous pharmacotherapy for medical weight loss:   Saxenda  and Wegovy .  She wasn't able to increase Wegovy  due to Scientist, clinical (histocompatibility and immunogenetics). Now unable to take due to cost    Bariatric surgery:  Patient has not had bariatric surgery.   Pharmacotherapy for DMT2:   She is currently taking Mounjaro  5mg .  Denies side effects.   Last A1c was 5.7 on 03/29/23 She is not checking BS at home.   Episodes of hypoglycemia: no On ACE   Last eye exam:  2024 She has tried Ozempic  in the past.  Lab Results  Component Value Date   HGBA1C 5.7 (H) 03/29/2023   HGBA1C 6.1 (H) 06/27/2022    HGBA1C 6.3 (H) 03/22/2022   Lab Results  Component Value Date   LDLCALC 90 03/29/2023   CREATININE 0.73 03/29/2023    Vit D deficiency  She is taking Vit D 50,000 IU but not consistently.   Denies side effects.  Denies nausea, vomiting or muscle weakness.    Lab Results  Component Value Date   VD25OH 11.4 (L) 03/29/2023   VD25OH 6.9 (L) 06/27/2022   VD25OH 7.4 (L) 03/22/2022      PHYSICAL EXAM:  Blood pressure 121/78, pulse 70, temperature 98.1 F (36.7 C), height 5\' 7"  (1.702 m), weight 297 lb (134.7 kg), SpO2 100%. Body mass index is 46.52 kg/m.  General: She is overweight, cooperative, alert, well developed, and in no acute distress. PSYCH: Has normal mood, affect and thought process.   Extremities: No edema.  Neurologic: No gross sensory or motor deficits. No tremors or fasciculations noted.    DIAGNOSTIC DATA REVIEWED:  BMET    Component Value Date/Time   NA 141 03/29/2023 1204   K 4.2 03/29/2023 1204   CL 104 03/29/2023 1204   CO2 23 03/29/2023 1204   GLUCOSE 85 03/29/2023 1204   GLUCOSE 100 (H) 09/06/2022 0118   BUN 9 03/29/2023 1204   CREATININE 0.73 03/29/2023 1204   CALCIUM 9.0 03/29/2023 1204   GFRNONAA >60 09/06/2022 0118   GFRAA 138 09/23/2019 1202  Lab Results  Component Value Date   HGBA1C 5.7 (H) 03/29/2023   HGBA1C 5.6 09/23/2019   Lab Results  Component Value Date   INSULIN  47.1 (H) 03/29/2023   INSULIN  166.0 (H) 09/23/2019   Lab Results  Component Value Date   TSH 1.000 03/29/2023   CBC    Component Value Date/Time   WBC 10.0 09/06/2022 0118   RBC 4.68 09/06/2022 0118   HGB 14.1 09/06/2022 0118   HGB 14.7 12/14/2021 1619   HCT 43.2 09/06/2022 0118   HCT 43.6 12/14/2021 1619   PLT 323 09/06/2022 0118   PLT 323 12/14/2021 1619   MCV 92.3 09/06/2022 0118   MCV 90 12/14/2021 1619   MCH 30.1 09/06/2022 0118   MCHC 32.6 09/06/2022 0118   RDW 12.9 09/06/2022 0118   RDW 12.3 12/14/2021 1619   Iron Studies    Component  Value Date/Time   IRON 54 05/26/2020 1011   TIBC 318 05/26/2020 1011   FERRITIN 77 05/26/2020 1011   IRONPCTSAT 17 05/26/2020 1011   Lipid Panel     Component Value Date/Time   CHOL 139 03/29/2023 1204   TRIG 85 03/29/2023 1204   HDL 33 (L) 03/29/2023 1204   CHOLHDL 4.0 02/01/2021 1146   LDLCALC 90 03/29/2023 1204   Hepatic Function Panel     Component Value Date/Time   PROT 7.6 03/29/2023 1204   ALBUMIN 3.8 (L) 03/29/2023 1204   AST 34 03/29/2023 1204   ALT 30 03/29/2023 1204   ALKPHOS 67 03/29/2023 1204   BILITOT 0.6 03/29/2023 1204      Component Value Date/Time   TSH 1.000 03/29/2023 1204   Nutritional Lab Results  Component Value Date   VD25OH 11.4 (L) 03/29/2023   VD25OH 6.9 (L) 06/27/2022   VD25OH 7.4 (L) 03/22/2022     ASSESSMENT AND PLAN  TREATMENT PLAN FOR OBESITY:  Recommended Dietary Goals  Katrina Carlson is currently in the action stage of change. As such, her goal is to continue weight management plan. She has agreed to the Category 4 Plan.  Behavioral Intervention  We discussed the following Behavioral Modification Strategies today: increasing lean protein intake to established goals, decreasing simple carbohydrates , increasing vegetables, increasing fiber rich foods, increasing water intake , work on meal planning and preparation, reading food labels , and continue to work on maintaining a reduced calorie state, getting the recommended amount of protein, incorporating whole foods, making healthy choices, staying well hydrated and practicing mindfulness when eating..  Additional resources provided today: NA  Recommended Physical Activity Goals  Katrina Carlson has been advised to work up to 150 minutes of moderate intensity aerobic activity a week and strengthening exercises 2-3 times per week for cardiovascular health, weight loss maintenance and preservation of muscle mass.   She has agreed to Think about enjoyable ways to increase daily physical activity  and overcoming barriers to exercise, Increase physical activity in their day and reduce sedentary time (increase NEAT)., Increase the intensity, frequency or duration of strengthening exercises , and Increase the intensity, frequency or duration of aerobic exercises      ASSOCIATED CONDITIONS ADDRESSED TODAY  Action/Plan  Type 2 diabetes mellitus without complication, without long-term current use of insulin  (HCC) -     Continue Tirzepatide ; Inject 5 mg into the skin once a week.  Dispense: 2 mL; Refill: 0  A1c is improving  Good blood sugar control is important to decrease the likelihood of diabetic complications such as nephropathy, neuropathy, limb loss, blindness, coronary  artery disease, and death. Intensive lifestyle modification including diet, exercise and weight loss are the first line of treatment for diabetes.    Vitamin D  deficiency -     Vitamin D  (Ergocalciferol ); Take 1 capsule (50,000 Units total) by mouth every 7 (seven) days.  Dispense: 5 capsule; Refill: 0  Low Vitamin D  level contributes to fatigue and are associated with obesity, breast, and colon cancer. She agrees to continue to take prescription Vitamin D  @50 ,000 IU every week and will follow-up for routine testing of Vitamin D , at least 2-3 times per year to avoid over-replacement.   Low HDL (under 40) Continue working on dietary changes, exercise and weight loss  Class 3 severe obesity due to excess calories without serious comorbidity with body mass index (BMI) of 45.0 to 49.9 in adult [facility])      Labs reviewed in chart with patient from 03/29/23   Return in about 4 weeks (around 07/12/2023).Aaron Aas She was informed of the importance of frequent follow up visits to maximize her success with intensive lifestyle modifications for her multiple health conditions.   ATTESTASTION STATEMENTS:  Reviewed by clinician on day of visit: allergies, medications, problem list, medical history, surgical history, family history,  social history, and previous encounter notes.     Crist Dominion. Caylor Cerino FNP-C

## 2023-07-12 ENCOUNTER — Ambulatory Visit: Admitting: Nurse Practitioner

## 2023-08-27 ENCOUNTER — Encounter: Payer: Self-pay | Admitting: Nurse Practitioner

## 2023-08-27 ENCOUNTER — Ambulatory Visit: Admitting: Nurse Practitioner

## 2023-08-27 VITALS — BP 130/78 | HR 66 | Temp 98.2°F | Ht 67.0 in | Wt 295.0 lb

## 2023-08-27 DIAGNOSIS — E66813 Obesity, class 3: Secondary | ICD-10-CM

## 2023-08-27 DIAGNOSIS — Z7985 Long-term (current) use of injectable non-insulin antidiabetic drugs: Secondary | ICD-10-CM

## 2023-08-27 DIAGNOSIS — Z79899 Other long term (current) drug therapy: Secondary | ICD-10-CM

## 2023-08-27 DIAGNOSIS — I10 Essential (primary) hypertension: Secondary | ICD-10-CM

## 2023-08-27 DIAGNOSIS — E559 Vitamin D deficiency, unspecified: Secondary | ICD-10-CM

## 2023-08-27 DIAGNOSIS — E119 Type 2 diabetes mellitus without complications: Secondary | ICD-10-CM | POA: Diagnosis not present

## 2023-08-27 DIAGNOSIS — Z6841 Body Mass Index (BMI) 40.0 and over, adult: Secondary | ICD-10-CM

## 2023-08-27 MED ORDER — VITAMIN D (ERGOCALCIFEROL) 1.25 MG (50000 UNIT) PO CAPS: 50000.0000 [IU] | ORAL_CAPSULE | ORAL | 0 refills | Status: DC

## 2023-08-27 MED ORDER — TIRZEPATIDE 5 MG/0.5ML ~~LOC~~ SOAJ: 5.0000 mg | SUBCUTANEOUS | 0 refills | Status: DC

## 2023-08-27 NOTE — Progress Notes (Signed)
 Office: [phone]  /  Fax: [phone]  WEIGHT SUMMARY AND BIOMETRICS  Weight Lost Since Last Visit: 2lb  Weight Gained Since Last Visit: 0lb   Vitals Temp: 98.2 F (36.8 C) BP: 130/78 Pulse Rate: 66 SpO2: 100 %   Anthropometric Measurements Height: 5' 7 (1.702 m) Weight: 295 lb (133.8 kg) BMI (Calculated): 46.19 Weight at Last Visit: 297lb Weight Lost Since Last Visit: 2lb Weight Gained Since Last Visit: 0lb Starting Weight: 331lb Total Weight Loss (lbs): 36 lb (16.3 kg)   Body Composition  Body Fat %: 47.1 % Fat Mass (lbs): 139.2 lbs Muscle Mass (lbs): 148.4 lbs Total Body Water (lbs): 110 lbs Visceral Fat Rating : 14   Other Clinical Data Fasting: No Labs: No Today's Visit #: 35 Starting Date: 04/29/19     HPI  Chief Complaint: OBESITY  Katrina Carlson is here to discuss her progress with her obesity treatment plan. She is on the the Category 4 Plan and states she is following her eating plan approximately 75 % of the time. She states she is exercising 20 minutes 2 days per week.   Interval History:  Since last office visit she has lost 2 pounds.  She feels that she is doing well on her meal plan.  She is eating 2-3 meals per day and is eating a protein with each meal.  She has been been eating more fruits over the summer.  She is drinking water, apple juice, soda, Gatorade and some sodas.  Plans to stop sodas intake.    Bioimpedance showed a reduction in body fat percentage, loss of fat mass, a reduction of visceral fat rating and pale and increase in muscle mass.   Pharmacotherapy for weight loss: She is not currently taking medications  for medical weight loss.     Previous pharmacotherapy for medical weight loss:   Saxenda  and Wegovy .  She wasn't able to increase Wegovy  due to Scientist, clinical (histocompatibility and immunogenetics). Now unable to take due to cost    Bariatric surgery:  Patient has not had bariatric surgery.    Pharmacotherapy for DMT2:   She is currently taking  Mounjaro  5mg .  Denies side effects.   Last A1c was 5.7 on 03/29/23 She is not checking BS at home.   Episodes of hypoglycemia: no On ACE   Last eye exam:  2024-discussed to sched eye exam She has tried Ozempic  in the past.    Lab Results  Component Value Date   HGBA1C 5.7 (H) 03/29/2023   HGBA1C 6.1 (H) 06/27/2022   HGBA1C 6.3 (H) 03/22/2022   Lab Results  Component Value Date   LDLCALC 90 03/29/2023   CREATININE 0.73 03/29/2023    Vit D deficiency  She is taking Vit D 50,000 IU weekly.  Denies side effects.  Denies nausea, vomiting or muscle weakness.    Lab Results  Component Value Date   VD25OH 11.4 (L) 03/29/2023   VD25OH 6.9 (L) 06/27/2022   VD25OH 7.4 (L) 03/22/2022     Hypertension Hypertension stable.  Medication(s): Lisinopril  5mg . Denies side effects  Denies chest pain, palpitations and SOB.  BP Readings from Last 3 Encounters:  08/27/23 130/78  06/14/23 121/78  03/29/23 136/81   Lab Results  Component Value Date   CREATININE 0.73 03/29/2023   CREATININE 0.75 09/06/2022   CREATININE 0.72 09/05/2022     PHYSICAL EXAM:  Blood pressure 130/78, pulse 66, temperature 98.2 F (36.8 C), height 5' 7 (1.702 m), weight 295 lb (133.8 kg), SpO2 100%. Body mass  index is 46.2 kg/m.  General: She is overweight, cooperative, alert, well developed, and in no acute distress. PSYCH: Has normal mood, affect and thought process.   Extremities: No edema.  Neurologic: No gross sensory or motor deficits. No tremors or fasciculations noted.    DIAGNOSTIC DATA REVIEWED:  BMET    Component Value Date/Time   NA 141 03/29/2023 1204   K 4.2 03/29/2023 1204   CL 104 03/29/2023 1204   CO2 23 03/29/2023 1204   GLUCOSE 85 03/29/2023 1204   GLUCOSE 100 (H) 09/06/2022 0118   BUN 9 03/29/2023 1204   CREATININE 0.73 03/29/2023 1204   CALCIUM 9.0 03/29/2023 1204   GFRNONAA >60 09/06/2022 0118   GFRAA 138 09/23/2019 1202   Lab Results  Component Value Date   HGBA1C 5.7  (H) 03/29/2023   HGBA1C 5.6 09/23/2019   Lab Results  Component Value Date   INSULIN  47.1 (H) 03/29/2023   INSULIN  166.0 (H) 09/23/2019   Lab Results  Component Value Date   TSH 1.000 03/29/2023   CBC    Component Value Date/Time   WBC 10.0 09/06/2022 0118   RBC 4.68 09/06/2022 0118   HGB 14.1 09/06/2022 0118   HGB 14.7 12/14/2021 1619   HCT 43.2 09/06/2022 0118   HCT 43.6 12/14/2021 1619   PLT 323 09/06/2022 0118   PLT 323 12/14/2021 1619   MCV 92.3 09/06/2022 0118   MCV 90 12/14/2021 1619   MCH 30.1 09/06/2022 0118   MCHC 32.6 09/06/2022 0118   RDW 12.9 09/06/2022 0118   RDW 12.3 12/14/2021 1619   Iron Studies    Component Value Date/Time   IRON 54 05/26/2020 1011   TIBC 318 05/26/2020 1011   FERRITIN 77 05/26/2020 1011   IRONPCTSAT 17 05/26/2020 1011   Lipid Panel     Component Value Date/Time   CHOL 139 03/29/2023 1204   TRIG 85 03/29/2023 1204   HDL 33 (L) 03/29/2023 1204   CHOLHDL 4.0 02/01/2021 1146   LDLCALC 90 03/29/2023 1204   Hepatic Function Panel     Component Value Date/Time   PROT 7.6 03/29/2023 1204   ALBUMIN 3.8 (L) 03/29/2023 1204   AST 34 03/29/2023 1204   ALT 30 03/29/2023 1204   ALKPHOS 67 03/29/2023 1204   BILITOT 0.6 03/29/2023 1204      Component Value Date/Time   TSH 1.000 03/29/2023 1204   Nutritional Lab Results  Component Value Date   VD25OH 11.4 (L) 03/29/2023   VD25OH 6.9 (L) 06/27/2022   VD25OH 7.4 (L) 03/22/2022     ASSESSMENT AND PLAN  TREATMENT PLAN FOR OBESITY:  Recommended Dietary Goals  Allegra is currently in the action stage of change. As such, her goal is to continue weight management plan. She has agreed to the Category 4 Plan.  Continue working on weight and calories and protein goals.  Behavioral Intervention  We discussed the following Behavioral Modification Strategies today: increasing lean protein intake to established goals, decreasing simple carbohydrates , increasing vegetables,  increasing fiber rich foods, increasing water intake , work on meal planning and preparation, reading food labels , keeping healthy foods at home, continue to practice mindfulness when eating, planning for success, and continue to work on maintaining a reduced calorie state, getting the recommended amount of protein, incorporating whole foods, making healthy choices, staying well hydrated and practicing mindfulness when eating..  Additional resources provided today: NA  Recommended Physical Activity Goals  Sunaina has been advised to work up to 150  minutes of moderate intensity aerobic activity a week and strengthening exercises 2-3 times per week for cardiovascular health, weight loss maintenance and preservation of muscle mass.   She has agreed to Think about enjoyable ways to increase daily physical activity and overcoming barriers to exercise, Increase physical activity in their day and reduce sedentary time (increase NEAT)., and continue to gradually increase the amount and intensity of exercise routine  Would greatly benefit from resistance training.  ASSOCIATED CONDITIONS ADDRESSED TODAY  Action/Plan  Type 2 diabetes mellitus without complication, without long-term current use of insulin  (HCC) -     Hemoglobin A1c -     Insulin , random -     Tirzepatide ; Inject 5 mg into the skin once a week.  Dispense: 2 mL; Refill: 0.  Side effects discussed.  Encouraged patient again to schedule eye exam.  Vitamin D  deficiency -     VITAMIN D  25 Hydroxy (Vit-D Deficiency, Fractures) -     Vitamin D  (Ergocalciferol ); Take 1 capsule (50,000 Units total) by mouth every 7 (seven) days.  Dispense: 5 capsule; Refill: 0.  Side effects discussed.  Essential hypertension -     CBC with Differential/Platelet  Continue follow-up with PCP.  Take medicines as directed.  Medication management -     Comprehensive metabolic panel with GFR -     CBC with Differential/Platelet  Class 3 severe obesity due  to excess calories without serious comorbidity with body mass index (BMI) of 45.0 to 49.9 in adult         Return in about 4 weeks (around 09/24/2023).SABRA She was informed of the importance of frequent follow up visits to maximize her success with intensive lifestyle modifications for her multiple health conditions.   ATTESTASTION STATEMENTS:  Reviewed by clinician on day of visit: allergies, medications, problem list, medical history, surgical history, family history, social history, and previous encounter notes.    Corean SAUNDERS. Reylynn Vanalstine FNP-C

## 2023-08-29 LAB — COMPREHENSIVE METABOLIC PANEL WITH GFR
ALT: 15 IU/L (ref 0–32)
AST: 16 IU/L (ref 0–40)
Albumin: 4 g/dL (ref 3.9–4.9)
Alkaline Phosphatase: 70 IU/L (ref 44–121)
BUN/Creatinine Ratio: 12 (ref 9–23)
BUN: 9 mg/dL (ref 6–20)
Bilirubin Total: 0.7 mg/dL (ref 0.0–1.2)
CO2: 21 mmol/L (ref 20–29)
Calcium: 9 mg/dL (ref 8.7–10.2)
Chloride: 104 mmol/L (ref 96–106)
Creatinine, Ser: 0.73 mg/dL (ref 0.57–1.00)
Globulin, Total: 3.6 g/dL (ref 1.5–4.5)
Glucose: 90 mg/dL (ref 70–99)
Potassium: 4.8 mmol/L (ref 3.5–5.2)
Sodium: 138 mmol/L (ref 134–144)
Total Protein: 7.6 g/dL (ref 6.0–8.5)
eGFR: 109 mL/min/1.73 (ref >59)

## 2023-08-29 LAB — CBC WITH DIFFERENTIAL/PLATELET
Basophils Absolute: 0.1 x10E3/uL (ref 0.0–0.2)
Basos: 1 %
EOS (ABSOLUTE): 0.2 x10E3/uL (ref 0.0–0.4)
Eos: 2 %
Hematocrit: 44.7 % (ref 34.0–46.6)
Hemoglobin: 14.8 g/dL (ref 11.1–15.9)
Immature Grans (Abs): 0 x10E3/uL (ref 0.0–0.1)
Immature Granulocytes: 0 %
Lymphocytes Absolute: 2.2 x10E3/uL (ref 0.7–3.1)
Lymphs: 32 %
MCH: 30.5 pg (ref 26.6–33.0)
MCHC: 33.1 g/dL (ref 31.5–35.7)
MCV: 92 fL (ref 79–97)
Monocytes Absolute: 0.4 x10E3/uL (ref 0.1–0.9)
Monocytes: 5 %
Neutrophils Absolute: 4.3 x10E3/uL (ref 1.4–7.0)
Neutrophils: 60 %
Platelets: 338 x10E3/uL (ref 150–450)
RBC: 4.85 x10E6/uL (ref 3.77–5.28)
RDW: 12.7 % (ref 11.7–15.4)
WBC: 7.1 x10E3/uL (ref 3.4–10.8)

## 2023-08-29 LAB — INSULIN, RANDOM: INSULIN: 48.8 u[IU]/mL — ABNORMAL HIGH (ref 2.6–24.9)

## 2023-08-29 LAB — HEMOGLOBIN A1C
Est. average glucose Bld gHb Est-mCnc: 117 mg/dL
Hgb A1c MFr Bld: 5.7 % — ABNORMAL HIGH (ref 4.8–5.6)

## 2023-08-29 LAB — VITAMIN D 25 HYDROXY (VIT D DEFICIENCY, FRACTURES): Vit D, 25-Hydroxy: 13.3 ng/mL — ABNORMAL LOW (ref 30.0–100.0)

## 2023-10-03 ENCOUNTER — Ambulatory Visit: Admitting: Nurse Practitioner

## 2023-11-06 ENCOUNTER — Other Ambulatory Visit: Payer: Self-pay | Admitting: Nurse Practitioner

## 2023-11-06 DIAGNOSIS — E559 Vitamin D deficiency, unspecified: Secondary | ICD-10-CM

## 2023-12-20 ENCOUNTER — Encounter: Payer: Self-pay | Admitting: Nurse Practitioner

## 2023-12-20 ENCOUNTER — Ambulatory Visit: Admitting: Nurse Practitioner

## 2023-12-20 VITALS — BP 138/87 | HR 67 | Temp 98.2°F | Ht 67.0 in | Wt 289.0 lb

## 2023-12-20 DIAGNOSIS — E559 Vitamin D deficiency, unspecified: Secondary | ICD-10-CM

## 2023-12-20 DIAGNOSIS — Z6841 Body Mass Index (BMI) 40.0 and over, adult: Secondary | ICD-10-CM | POA: Diagnosis not present

## 2023-12-20 DIAGNOSIS — E66813 Obesity, class 3: Secondary | ICD-10-CM | POA: Diagnosis not present

## 2023-12-20 DIAGNOSIS — E119 Type 2 diabetes mellitus without complications: Secondary | ICD-10-CM | POA: Diagnosis not present

## 2023-12-20 DIAGNOSIS — Z7985 Long-term (current) use of injectable non-insulin antidiabetic drugs: Secondary | ICD-10-CM

## 2023-12-20 MED ORDER — TIRZEPATIDE 2.5 MG/0.5ML ~~LOC~~ SOAJ: 2.5000 mg | SUBCUTANEOUS | 0 refills | Status: DC

## 2023-12-20 MED ORDER — VITAMIN D (ERGOCALCIFEROL) 1.25 MG (50000 UNIT) PO CAPS: 50000.0000 [IU] | ORAL_CAPSULE | ORAL | 0 refills | Status: DC

## 2023-12-20 NOTE — Progress Notes (Signed)
 Office: [phone]  /  Fax: [phone]  WEIGHT SUMMARY AND BIOMETRICS  Weight Lost Since Last Visit: 6lb  Weight Gained Since Last Visit: 0lb   Vitals Temp: 98.2 F (36.8 C) BP: 138/87 Pulse Rate: 67 SpO2: 100 %   Anthropometric Measurements Height: 5' 7 (1.702 m) Weight: 289 lb (131.1 kg) BMI (Calculated): 45.25 Weight at Last Visit: 295lb Weight Lost Since Last Visit: 6lb Weight Gained Since Last Visit: 0lb Starting Weight: 331lb Total Weight Loss (lbs): 42 lb (19.1 kg)   Body Composition  Body Fat %: 50.9 % Fat Mass (lbs): 147.2 lbs Muscle Mass (lbs): 134.6 lbs Total Body Water (lbs): 107 lbs Visceral Fat Rating : 15   Other Clinical Data Fasting: No Labs: No Today's Visit #: 36 Starting Date: 04/29/19     HPI  Chief Complaint: OBESITY  Katrina Carlson is here to discuss her progress with her obesity treatment plan. She is on the the Category 4 Plan and states she is following her eating plan approximately 70 % of the time. She states she is exercising 0 minutes 0 days per week.   Interval History:  Since last office visit on 08/27/23 she has lost 6 pounds. She is doing well with cat 4 plan.  She is drinking one soda and water daily, occ juice.  She hasn't been able to walk as she has in the past due to her work schedule and left ankle.  She has a friend that is a engineer, civil (consulting) and is helping her with PT.  She was in boot in Sept for 3 weeks.   Pharmacotherapy for weight loss: She is not currently taking medications  for medical weight loss.     Previous pharmacotherapy for medical weight loss:   Saxenda  and Wegovy .  She wasn't able to increase Wegovy  due to scientist, clinical (histocompatibility and immunogenetics). Now unable to take due to cost    Bariatric surgery:  Patient has not had bariatric surgery.   Pharmacotherapy for DMT2:   She is not currently taking Mounjaro  5mg .  Her last dose was the first week in September.  Last A1c was 5.7 on 03/29/23 She is not checking BS at home.    Episodes of hypoglycemia: no On ACE-lisinopril  5mg    Last eye exam:  2024-discussed to sched eye exam She has tried Ozempic  in the past.    Lab Results  Component Value Date   HGBA1C 5.7 (H) 08/27/2023   HGBA1C 5.7 (H) 03/29/2023   HGBA1C 6.1 (H) 06/27/2022   Lab Results  Component Value Date   LDLCALC 90 03/29/2023   CREATININE 0.73 08/27/2023     Vit D deficiency  She is not currently taking Vit D 50,000 international units -took last over a month ago.  Denies side effects.  Denies nausea, vomiting or muscle weakness.    Lab Results  Component Value Date   VD25OH 13.3 (L) 08/27/2023   VD25OH 11.4 (L) 03/29/2023   VD25OH 6.9 (L) 06/27/2022     PHYSICAL EXAM:  Blood pressure 138/87, pulse 67, temperature 98.2 F (36.8 C), height 5' 7 (1.702 m), weight 289 lb (131.1 kg), SpO2 100%. Body mass index is 45.26 kg/m.  General: She is overweight, cooperative, alert, well developed, and in no acute distress. PSYCH: Has normal mood, affect and thought process.   Extremities: No edema.  Neurologic: No gross sensory or motor deficits. No tremors or fasciculations noted.    DIAGNOSTIC DATA REVIEWED:  BMET    Component Value Date/Time   NA 138 08/27/2023  1144   K 4.8 08/27/2023 1144   CL 104 08/27/2023 1144   CO2 21 08/27/2023 1144   GLUCOSE 90 08/27/2023 1144   GLUCOSE 100 (H) 09/06/2022 0118   BUN 9 08/27/2023 1144   CREATININE 0.73 08/27/2023 1144   CALCIUM 9.0 08/27/2023 1144   GFRNONAA >60 09/06/2022 0118   GFRAA 138 09/23/2019 1202   Lab Results  Component Value Date   HGBA1C 5.7 (H) 08/27/2023   HGBA1C 5.6 09/23/2019   Lab Results  Component Value Date   INSULIN  48.8 (H) 08/27/2023   INSULIN  166.0 (H) 09/23/2019   Lab Results  Component Value Date   TSH 1.000 03/29/2023   CBC    Component Value Date/Time   WBC 7.1 08/27/2023 1144   WBC 10.0 09/06/2022 0118   RBC 4.85 08/27/2023 1144   RBC 4.68 09/06/2022 0118   HGB 14.8 08/27/2023 1144   HCT  44.7 08/27/2023 1144   PLT 338 08/27/2023 1144   MCV 92 08/27/2023 1144   MCH 30.5 08/27/2023 1144   MCH 30.1 09/06/2022 0118   MCHC 33.1 08/27/2023 1144   MCHC 32.6 09/06/2022 0118   RDW 12.7 08/27/2023 1144   Iron Studies    Component Value Date/Time   IRON 54 05/26/2020 1011   TIBC 318 05/26/2020 1011   FERRITIN 77 05/26/2020 1011   IRONPCTSAT 17 05/26/2020 1011   Lipid Panel     Component Value Date/Time   CHOL 139 03/29/2023 1204   TRIG 85 03/29/2023 1204   HDL 33 (L) 03/29/2023 1204   CHOLHDL 4.0 02/01/2021 1146   LDLCALC 90 03/29/2023 1204   Hepatic Function Panel     Component Value Date/Time   PROT 7.6 08/27/2023 1144   ALBUMIN 4.0 08/27/2023 1144   AST 16 08/27/2023 1144   ALT 15 08/27/2023 1144   ALKPHOS 70 08/27/2023 1144   BILITOT 0.7 08/27/2023 1144      Component Value Date/Time   TSH 1.000 03/29/2023 1204   Nutritional Lab Results  Component Value Date   VD25OH 13.3 (L) 08/27/2023   VD25OH 11.4 (L) 03/29/2023   VD25OH 6.9 (L) 06/27/2022     ASSESSMENT AND PLAN  TREATMENT PLAN FOR OBESITY:  Recommended Dietary Goals  Katrina Carlson is currently in the action stage of change. As such, her goal is to continue weight management plan. She has agreed to track and will review calories and macros at next visit.    Behavioral Intervention  We discussed the following Behavioral Modification Strategies today: increasing lean protein intake to established goals, decreasing simple carbohydrates , increasing vegetables, increasing fiber rich foods, increasing water intake , work on meal planning and preparation, reading food labels , keeping healthy foods at home, continue to work on maintaining a reduced calorie state, getting the recommended amount of protein, incorporating whole foods, making healthy choices, staying well hydrated and practicing mindfulness when eating., and increase protein intake, fibrous foods (25 grams per day for women, 30 grams for men)  and water to improve satiety and decrease hunger signals. .  Additional resources provided today: NA  Recommended Physical Activity Goals  Katrina Carlson has been advised to work up to 150 minutes of moderate intensity aerobic activity a week and strengthening exercises 2-3 times per week for cardiovascular health, weight loss maintenance and preservation of muscle mass.   She has agreed to Think about enjoyable ways to increase daily physical activity and overcoming barriers to exercise, Increase physical activity in their day and reduce sedentary time (  increase NEAT)., Work on scheduling and tracking physical activity. , and Combine aerobic and strengthening exercises for efficiency and improved cardiometabolic health.   ASSOCIATED CONDITIONS ADDRESSED TODAY  Action/Plan  Type 2 diabetes mellitus without complication, without long-term current use of insulin  (HCC) -     Restart Tirzepatide ; Inject 2.5 mg into the skin once a week.  Dispense: 2 mL; Refill: 0. Side effects discussed.   Contraindications:  Pancreatitis (active gallstones) Medullary thyroid cancer High triglycerides (>500)-will need labs prior to starting Multiple Endocrine Neoplasia syndrome type 2 (MEN 2) Trying to get pregnant Breastfeeding Use with caution with taking insulin  or sulfonylureas (will need to monitor blood sugars for hypoglycemia)  Vitamin D  deficiency -     Vitamin D  (Ergocalciferol ); Take 1 capsule (50,000 Units total) by mouth every 7 (seven) days.  Dispense: 5 capsule; Refill: 0  Low Vitamin D  level contributes to fatigue and are associated with obesity, breast, and colon cancer. She agrees to continue to take prescription Vitamin D  @50 ,000 IU every week and will follow-up for routine testing of Vitamin D , at least 2-3 times per year to avoid over-replacement.   Class 3 severe obesity due to excess calories without serious comorbidity with body mass index (BMI) of 45.0 to 49.9 in adult [facility])      Will  obtain labs at next visit  Labs reviewed in chart with patient from 08/27/23   Return in about 4 weeks (around 01/17/2024).SABRA She was informed of the importance of frequent follow up visits to maximize her success with intensive lifestyle modifications for her multiple health conditions.   ATTESTASTION STATEMENTS:  Reviewed by clinician on day of visit: allergies, medications, problem list, medical history, surgical history, family history, social history, and previous encounter notes.     Katrina Carlson Katrina Carlson. Jim Philemon FNP-C

## 2023-12-20 NOTE — Patient Instructions (Signed)

## 2024-01-22 ENCOUNTER — Ambulatory Visit: Admitting: Nurse Practitioner

## 2024-02-26 ENCOUNTER — Ambulatory Visit: Admitting: Nurse Practitioner

## 2024-02-26 ENCOUNTER — Encounter: Payer: Self-pay | Admitting: Nurse Practitioner

## 2024-02-26 VITALS — BP 128/76 | HR 75 | Ht 67.0 in | Wt 276.0 lb

## 2024-02-26 DIAGNOSIS — Z7985 Long-term (current) use of injectable non-insulin antidiabetic drugs: Secondary | ICD-10-CM

## 2024-02-26 DIAGNOSIS — Z6841 Body Mass Index (BMI) 40.0 and over, adult: Secondary | ICD-10-CM

## 2024-02-26 DIAGNOSIS — E559 Vitamin D deficiency, unspecified: Secondary | ICD-10-CM

## 2024-02-26 DIAGNOSIS — E119 Type 2 diabetes mellitus without complications: Secondary | ICD-10-CM | POA: Diagnosis not present

## 2024-02-26 MED ORDER — TIRZEPATIDE 2.5 MG/0.5ML ~~LOC~~ SOAJ: 2.5000 mg | SUBCUTANEOUS | 1 refills | Status: AC

## 2024-02-26 MED ORDER — VITAMIN D (ERGOCALCIFEROL) 1.25 MG (50000 UNIT) PO CAPS: 50000.0000 [IU] | ORAL_CAPSULE | ORAL | 0 refills | Status: AC

## 2024-02-26 NOTE — Progress Notes (Deleted)
" ° ° °                                                                                                     °    ° °  WEIGHT SUMMARY AND BIOMETRICS  No data recorded No data recorded  No data recorded No data recorded No data recorded No data recorded  OBESITY Katrina Carlson is here to discuss her progress with her obesity treatment plan along with follow-up of her obesity related diagnoses.    Nutrition Plan: the Category 4 plan - ***% adherence.  Current exercise: {exercise types:16438}  Interim History:  *** {aabnutritionassessment:29213}   Pharmacotherapy: Katrina Carlson is on {dwwpharmacotherapy:29109} Adverse side effects: {dwwse:29122} Hunger is {EWCONTROLASSESSMENT:24261}.  Cravings are {EWCONTROLASSESSMENT:24261}.  Assessment/Plan:   There are no diagnoses linked to this encounter.    {dwwmorbid:29108::Morbid Obesity}: Current BMI No data recorded  Pharmacotherapy Plan {dwwmed:29123}  {dwwpharmacotherapy:29109}  Katrina Carlson {CHL AMB IS/IS NOT:210130109} currently in the action stage of change. As such, her goal is to {MWMwtloss#1:210800005}.  She has agreed to {dwwsldiets:29085}.  Exercise goals: {MWM EXERCISE RECS:23473}  Behavioral modification strategies: {dwwslwtlossstrategies:29088}.  Katrina Carlson has agreed to follow-up with our clinic in {NUMBER 1-10:22536} weeks.   No orders of the defined types were placed in this encounter.   There are no discontinued medications.   No orders of the defined types were placed in this encounter.     Objective:   VITALS: Per patient if applicable, see vitals. GENERAL: Alert and in no acute distress. CARDIOPULMONARY: No increased WOB. Speaking in clear sentences.  PSYCH: Pleasant and cooperative. Speech normal rate and rhythm. Affect is appropriate. Insight and judgement are appropriate. Attention is focused, linear, and appropriate.  NEURO: Oriented as arrived to appointment on time with no prompting.   Attestation Statements:    This was prepared with the assistance of Engineer, Civil (consulting).  Occasional wrong-word or sound-a-like substitutions may have occurred due to the inherent limitations of voice recognition   "

## 2024-02-26 NOTE — Progress Notes (Signed)
 "  Office: [phone]  /  Fax: [phone]  WEIGHT SUMMARY AND BIOMETRICS  Weight Lost Since Last Visit: 13lb  Weight Gained Since Last Visit: 0   Vitals BP: 128/76 Pulse Rate: 75 SpO2: 95 %   Anthropometric Measurements Height: 5' 7 (1.702 m) Weight: 276 lb (125.2 kg) BMI (Calculated): 43.22 Weight at Last Visit: 289lb Weight Lost Since Last Visit: 13lb Weight Gained Since Last Visit: 0 Starting Weight: 331lb Total Weight Loss (lbs): 55 lb (24.9 kg)   Body Composition  Body Fat %: 49.2 % Fat Mass (lbs): 136.2 lbs Muscle Mass (lbs): 133.6 lbs Total Body Water (lbs): 101.4 lbs Visceral Fat Rating : 14   Other Clinical Data Fasting: no Labs: no Today's Visit #: 36 Starting Date: 04/29/19     HPI  Chief Complaint: OBESITY  Katrina Carlson is here to discuss her progress with her obesity treatment plan. She is on the the Category 4 Plan and states she is following her eating plan approximately 70 % of the time. She states she is exercising 40 minutes 4 days per week.   Interval History:  Since last office visit on 12/20/23 she has lost 13 pounds.  She has overall done well with weight loss.  She has been tracking/journaling her food.  She has been re shifting and staying on a schedule.  She has been setting boundaries in her life that has been beneficial. She is waking up earlier and reading her bible daily.  She is more in a routine and meal prepping.  She is eating more at home.  She is drinking water and 1 soda daily.  She has decreased her juice intake.  She is doing youtube channels to stay active.   He ankle and knee are feeling better since losing weight.    Pharmacotherapy for weight loss: She is not currently taking medications  for medical weight loss.     Previous pharmacotherapy for medical weight loss:   Saxenda  and Wegovy .  -She wasn't able to increase Wegovy  due to nationwide shortage and wasn't able to continue due to cost   Bariatric surgery:   Patient has not had bariatric surgery.   Pharmacotherapy for DMT2:   She is  taking Mounjaro  2.5mg .  Denies side effects.  Last A1c was 5.7 on 03/29/23 She is not checking BS at home.   Episodes of hypoglycemia: none On ACE-lisinopril  5mg    Last eye exam:  2024-discussed to sched eye exam-plans to schedule She has tried Ozempic  in the past.      Lab Results  Component Value Date   HGBA1C 5.7 (H) 08/27/2023   HGBA1C 5.7 (H) 03/29/2023   HGBA1C 6.1 (H) 06/27/2022   Lab Results  Component Value Date   LDLCALC 90 03/29/2023   CREATININE 0.73 08/27/2023    Vit D deficiency  She is taking Vit D 50,000 IU weekly.  Denies side effects.  Denies nausea, vomiting or muscle weakness.    Lab Results  Component Value Date   VD25OH 13.3 (L) 08/27/2023   VD25OH 11.4 (L) 03/29/2023   VD25OH 6.9 (L) 06/27/2022    PHYSICAL EXAM:  Blood pressure 128/76, pulse 75, height 5' 7 (1.702 m), weight 276 lb (125.2 kg), SpO2 95%. Body mass index is 43.23 kg/m.  General: She is overweight, cooperative, alert, well developed, and in no acute distress. PSYCH: Has normal mood, affect and thought process.   Extremities: No edema.  Neurologic: No gross sensory or motor deficits. No tremors or fasciculations noted.  DIAGNOSTIC DATA REVIEWED:  BMET    Component Value Date/Time   NA 138 08/27/2023 1144   K 4.8 08/27/2023 1144   CL 104 08/27/2023 1144   CO2 21 08/27/2023 1144   GLUCOSE 90 08/27/2023 1144   GLUCOSE 100 (H) 09/06/2022 0118   BUN 9 08/27/2023 1144   CREATININE 0.73 08/27/2023 1144   CALCIUM 9.0 08/27/2023 1144   GFRNONAA >60 09/06/2022 0118   GFRAA 138 09/23/2019 1202   Lab Results  Component Value Date   HGBA1C 5.7 (H) 08/27/2023   HGBA1C 5.6 09/23/2019   Lab Results  Component Value Date   INSULIN  48.8 (H) 08/27/2023   INSULIN  166.0 (H) 09/23/2019   Lab Results  Component Value Date   TSH 1.000 03/29/2023   CBC    Component Value Date/Time   WBC 7.1 08/27/2023  1144   WBC 10.0 09/06/2022 0118   RBC 4.85 08/27/2023 1144   RBC 4.68 09/06/2022 0118   HGB 14.8 08/27/2023 1144   HCT 44.7 08/27/2023 1144   PLT 338 08/27/2023 1144   MCV 92 08/27/2023 1144   MCH 30.5 08/27/2023 1144   MCH 30.1 09/06/2022 0118   MCHC 33.1 08/27/2023 1144   MCHC 32.6 09/06/2022 0118   RDW 12.7 08/27/2023 1144   Iron Studies    Component Value Date/Time   IRON 54 05/26/2020 1011   TIBC 318 05/26/2020 1011   FERRITIN 77 05/26/2020 1011   IRONPCTSAT 17 05/26/2020 1011   Lipid Panel     Component Value Date/Time   CHOL 139 03/29/2023 1204   TRIG 85 03/29/2023 1204   HDL 33 (L) 03/29/2023 1204   CHOLHDL 4.0 02/01/2021 1146   LDLCALC 90 03/29/2023 1204   Hepatic Function Panel     Component Value Date/Time   PROT 7.6 08/27/2023 1144   ALBUMIN 4.0 08/27/2023 1144   AST 16 08/27/2023 1144   ALT 15 08/27/2023 1144   ALKPHOS 70 08/27/2023 1144   BILITOT 0.7 08/27/2023 1144      Component Value Date/Time   TSH 1.000 03/29/2023 1204   Nutritional Lab Results  Component Value Date   VD25OH 13.3 (L) 08/27/2023   VD25OH 11.4 (L) 03/29/2023   VD25OH 6.9 (L) 06/27/2022     ASSESSMENT AND PLAN  TREATMENT PLAN FOR OBESITY:  Recommended Dietary Goals  Katrina Carlson is currently in the action stage of change. As such, her goal is to continue weight management plan. She has agreed to practicing portion control and making smarter food choices, such as increasing vegetables and decreasing simple carbohydrates.  Behavioral Intervention  We discussed the following Behavioral Modification Strategies today: increasing lean protein intake to established goals, increasing vegetables, increasing fiber rich foods, increasing water intake , work on meal planning and preparation, reading food labels , keeping healthy foods at home, planning for success, better snacking choices, continue to work on maintaining a reduced calorie state, getting the recommended amount of  protein, incorporating whole foods, making healthy choices, staying well hydrated and practicing mindfulness when eating., and increase protein intake, fibrous foods (25 grams per day for women, 30 grams for men) and water to improve satiety and decrease hunger signals. .  Additional resources provided today: NA  Recommended Physical Activity Goals  [place] has been advised to work up to 150 minutes of moderate intensity aerobic activity a week and strengthening exercises 2-3 times per week for cardiovascular health, weight loss maintenance and preservation of muscle mass.   She has agreed to Think about  enjoyable ways to increase daily physical activity and overcoming barriers to exercise, Increase physical activity in their day and reduce sedentary time (increase NEAT)., Work on scheduling and tracking physical activity. , Increase volume of physical activity to a goal of 240 minutes a week, and Combine aerobic and strengthening exercises for efficiency and improved cardiometabolic health.    ASSOCIATED CONDITIONS ADDRESSED TODAY  Action/Plan  Type 2 diabetes mellitus without complication, without long-term current use of insulin  (HCC) -     Continue Tirzepatide ; Inject 2.5 mg into the skin once a week.  Dispense: 2 mL; Refill: 1  Vitamin D  deficiency -     Continue Vitamin D  (Ergocalciferol ); Take 1 capsule (50,000 Units total) by mouth every 7 (seven) days.  Dispense: 5 capsule; Refill: 0  Morbid obesity (HCC)  BMI 40.0-44.9, adult [facility])      Patient is not fasting.  Will check fasting at next visit.     Bio Impedance reviewed with the patient.   Return in about 4 weeks (around 03/25/2024).SABRA She was informed of the importance of frequent follow up visits to maximize her success with intensive lifestyle modifications for her multiple health conditions.   ATTESTASTION STATEMENTS:  Reviewed by clinician on day of visit: allergies, medications, problem list, medical history,  surgical history, family history, social history, and previous encounter notes.     Corean SAUNDERS. Shay Jhaveri FNP-C  "

## 2024-04-03 ENCOUNTER — Ambulatory Visit: Admitting: Nurse Practitioner
